# Patient Record
Sex: Female | Born: 1980 | Hispanic: No | Marital: Single | Smoking: Former smoker
Health system: Southern US, Community
[De-identification: ages and names within clinical notes are randomized; demographics above are authoritative.]

## PROBLEM LIST (undated history)

## (undated) ENCOUNTER — Inpatient Hospital Stay (HOSPITAL_COMMUNITY): Payer: Self-pay

## (undated) DIAGNOSIS — L0390 Cellulitis, unspecified: Secondary | ICD-10-CM

## (undated) DIAGNOSIS — F419 Anxiety disorder, unspecified: Secondary | ICD-10-CM

## (undated) DIAGNOSIS — F200 Paranoid schizophrenia: Secondary | ICD-10-CM

## (undated) DIAGNOSIS — F1910 Other psychoactive substance abuse, uncomplicated: Secondary | ICD-10-CM

## (undated) DIAGNOSIS — F319 Bipolar disorder, unspecified: Secondary | ICD-10-CM

## (undated) DIAGNOSIS — L0291 Cutaneous abscess, unspecified: Secondary | ICD-10-CM

## (undated) HISTORY — DX: Anxiety disorder, unspecified: F41.9

## (undated) HISTORY — PX: WISDOM TOOTH EXTRACTION: SHX21

---
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
## 2002-01-11 ENCOUNTER — Inpatient Hospital Stay (HOSPITAL_COMMUNITY): Admission: AD | Admit: 2002-01-11 | Discharge: 2002-01-11 | Payer: Self-pay | Admitting: *Deleted

## 2002-01-17 ENCOUNTER — Encounter (INDEPENDENT_AMBULATORY_CARE_PROVIDER_SITE_OTHER): Payer: Self-pay

## 2002-01-17 ENCOUNTER — Encounter (HOSPITAL_COMMUNITY): Admission: RE | Admit: 2002-01-17 | Discharge: 2002-01-19 | Payer: Self-pay | Admitting: *Deleted

## 2002-01-17 ENCOUNTER — Inpatient Hospital Stay (HOSPITAL_COMMUNITY): Admission: AD | Admit: 2002-01-17 | Discharge: 2002-01-21 | Payer: Self-pay | Admitting: *Deleted

## 2004-08-10 ENCOUNTER — Emergency Department (HOSPITAL_COMMUNITY): Admission: EM | Admit: 2004-08-10 | Discharge: 2004-08-10 | Payer: Self-pay | Admitting: Emergency Medicine

## 2005-07-03 ENCOUNTER — Emergency Department (HOSPITAL_COMMUNITY): Admission: EM | Admit: 2005-07-03 | Discharge: 2005-07-03 | Payer: Self-pay | Admitting: Emergency Medicine

## 2006-01-07 ENCOUNTER — Emergency Department (HOSPITAL_COMMUNITY): Admission: EM | Admit: 2006-01-07 | Discharge: 2006-01-07 | Payer: Self-pay | Admitting: Emergency Medicine

## 2006-01-08 ENCOUNTER — Ambulatory Visit: Payer: Self-pay | Admitting: Psychiatry

## 2006-01-08 ENCOUNTER — Inpatient Hospital Stay (HOSPITAL_COMMUNITY): Admission: EM | Admit: 2006-01-08 | Discharge: 2006-01-16 | Payer: Self-pay | Admitting: Psychiatry

## 2006-01-22 ENCOUNTER — Emergency Department (HOSPITAL_COMMUNITY): Admission: EM | Admit: 2006-01-22 | Discharge: 2006-01-22 | Payer: Self-pay | Admitting: Emergency Medicine

## 2006-01-23 ENCOUNTER — Inpatient Hospital Stay (HOSPITAL_COMMUNITY): Admission: AD | Admit: 2006-01-23 | Discharge: 2006-02-05 | Payer: Self-pay | Admitting: Psychiatry

## 2007-08-26 ENCOUNTER — Emergency Department (HOSPITAL_COMMUNITY): Admission: EM | Admit: 2007-08-26 | Discharge: 2007-08-26 | Payer: Self-pay | Admitting: Emergency Medicine

## 2008-09-28 ENCOUNTER — Inpatient Hospital Stay (HOSPITAL_COMMUNITY): Admission: AD | Admit: 2008-09-28 | Discharge: 2008-09-28 | Payer: Self-pay | Admitting: Obstetrics & Gynecology

## 2008-10-01 ENCOUNTER — Ambulatory Visit: Payer: Self-pay | Admitting: Obstetrics and Gynecology

## 2008-10-01 ENCOUNTER — Encounter: Payer: Self-pay | Admitting: Obstetrics & Gynecology

## 2008-10-01 ENCOUNTER — Inpatient Hospital Stay (HOSPITAL_COMMUNITY): Admission: AD | Admit: 2008-10-01 | Discharge: 2008-10-03 | Payer: Self-pay | Admitting: Obstetrics & Gynecology

## 2009-05-20 ENCOUNTER — Emergency Department (HOSPITAL_COMMUNITY): Admission: EM | Admit: 2009-05-20 | Discharge: 2009-05-20 | Payer: Self-pay | Admitting: Emergency Medicine

## 2011-02-21 ENCOUNTER — Emergency Department (HOSPITAL_BASED_OUTPATIENT_CLINIC_OR_DEPARTMENT_OTHER)
Admission: EM | Admit: 2011-02-21 | Discharge: 2011-02-21 | Disposition: A | Discharge status: Home / Self Care | Payer: Self-pay | Attending: Emergency Medicine | Admitting: Emergency Medicine

## 2011-02-21 DIAGNOSIS — N390 Urinary tract infection, site not specified: Secondary | ICD-10-CM | POA: Insufficient documentation

## 2011-02-21 DIAGNOSIS — R112 Nausea with vomiting, unspecified: Secondary | ICD-10-CM | POA: Insufficient documentation

## 2011-02-21 DIAGNOSIS — F319 Bipolar disorder, unspecified: Secondary | ICD-10-CM | POA: Insufficient documentation

## 2011-02-21 DIAGNOSIS — E876 Hypokalemia: Secondary | ICD-10-CM | POA: Insufficient documentation

## 2011-02-21 DIAGNOSIS — Z79899 Other long term (current) drug therapy: Secondary | ICD-10-CM | POA: Insufficient documentation

## 2011-02-21 DIAGNOSIS — R509 Fever, unspecified: Secondary | ICD-10-CM | POA: Insufficient documentation

## 2011-02-21 LAB — URINALYSIS, ROUTINE W REFLEX MICROSCOPIC
Ketones, ur: 15 mg/dL — AB
Protein, ur: 100 mg/dL — AB
Specific Gravity, Urine: 1.031 — ABNORMAL HIGH (ref 1.005–1.030)
Urine Glucose, Fasting: NEGATIVE mg/dL
Urobilinogen, UA: 1 mg/dL (ref 0.0–1.0)
pH: 6 (ref 5.0–8.0)

## 2011-02-21 LAB — URINE MICROSCOPIC-ADD ON

## 2011-02-21 LAB — PREGNANCY, URINE: Preg Test, Ur: NEGATIVE

## 2011-02-21 LAB — BASIC METABOLIC PANEL
CO2: 30 mEq/L (ref 19–32)
Calcium: 9.4 mg/dL (ref 8.4–10.5)

## 2011-02-23 ENCOUNTER — Ambulatory Visit (HOSPITAL_BASED_OUTPATIENT_CLINIC_OR_DEPARTMENT_OTHER): Admission: RE | Admit: 2011-02-23 | Payer: Self-pay | Source: Ambulatory Visit

## 2011-02-23 ENCOUNTER — Emergency Department (HOSPITAL_BASED_OUTPATIENT_CLINIC_OR_DEPARTMENT_OTHER)
Admission: EM | Admit: 2011-02-23 | Discharge: 2011-02-23 | Disposition: A | Discharge status: Home / Self Care | Payer: Self-pay | Attending: Emergency Medicine | Admitting: Emergency Medicine

## 2011-02-23 ENCOUNTER — Emergency Department (INDEPENDENT_AMBULATORY_CARE_PROVIDER_SITE_OTHER): Payer: Self-pay

## 2011-02-23 DIAGNOSIS — K838 Other specified diseases of biliary tract: Secondary | ICD-10-CM

## 2011-02-23 DIAGNOSIS — F319 Bipolar disorder, unspecified: Secondary | ICD-10-CM | POA: Insufficient documentation

## 2011-02-23 DIAGNOSIS — B9689 Other specified bacterial agents as the cause of diseases classified elsewhere: Secondary | ICD-10-CM | POA: Insufficient documentation

## 2011-02-23 DIAGNOSIS — R109 Unspecified abdominal pain: Secondary | ICD-10-CM

## 2011-02-23 DIAGNOSIS — M549 Dorsalgia, unspecified: Secondary | ICD-10-CM | POA: Insufficient documentation

## 2011-02-23 DIAGNOSIS — A499 Bacterial infection, unspecified: Secondary | ICD-10-CM | POA: Insufficient documentation

## 2011-02-23 DIAGNOSIS — N760 Acute vaginitis: Secondary | ICD-10-CM | POA: Insufficient documentation

## 2011-02-23 LAB — CBC
HCT: 37.1 % (ref 36.0–46.0)
Hemoglobin: 13 g/dL (ref 12.0–15.0)
MCHC: 35 g/dL (ref 30.0–36.0)
WBC: 13.5 10*3/uL — ABNORMAL HIGH (ref 4.0–10.5)

## 2011-02-23 LAB — BASIC METABOLIC PANEL
BUN: 7 mg/dL (ref 6–23)
CO2: 32 mEq/L (ref 19–32)
Calcium: 8.2 mg/dL — ABNORMAL LOW (ref 8.4–10.5)
GFR calc Af Amer: >60 mL/min (ref >60)
Glucose, Bld: 90 mg/dL (ref 70–99)
Potassium: 4.8 mEq/L (ref 3.5–5.1)
Sodium: 137 mEq/L (ref 135–145)

## 2011-02-23 LAB — URINALYSIS, ROUTINE W REFLEX MICROSCOPIC
Hgb urine dipstick: NEGATIVE
Ketones, ur: 15 mg/dL — AB
Nitrite: NEGATIVE
Protein, ur: NEGATIVE mg/dL
Urobilinogen, UA: 4 mg/dL — ABNORMAL HIGH (ref 0.0–1.0)

## 2011-02-23 LAB — HEPATIC FUNCTION PANEL
ALT: 29 U/L (ref 0–35)
Albumin: 2.9 g/dL — ABNORMAL LOW (ref 3.5–5.2)
Bilirubin, Direct: 0 mg/dL (ref 0.0–0.3)
Indirect Bilirubin: 1.1 mg/dL — ABNORMAL HIGH (ref 0.3–0.9)
Total Bilirubin: 1.1 mg/dL (ref 0.3–1.2)

## 2011-02-23 LAB — DIFFERENTIAL
Basophils Absolute: 0 10*3/uL (ref 0.0–0.1)
Basophils Relative: 0 % (ref 0–1)
Eosinophils Absolute: 0 10*3/uL (ref 0.0–0.7)
Lymphocytes Relative: 21 % (ref 12–46)
Neutro Abs: 9.2 10*3/uL — ABNORMAL HIGH (ref 1.7–7.7)

## 2011-02-23 LAB — LIPASE, BLOOD: Lipase: 164 U/L (ref 23–300)

## 2011-02-23 LAB — WET PREP, GENITAL: Yeast Wet Prep HPF POC: NONE SEEN

## 2011-02-23 LAB — URINE MICROSCOPIC-ADD ON

## 2011-02-23 LAB — PREGNANCY, URINE: Preg Test, Ur: NEGATIVE

## 2011-02-24 ENCOUNTER — Ambulatory Visit (HOSPITAL_BASED_OUTPATIENT_CLINIC_OR_DEPARTMENT_OTHER)
Admission: RE | Admit: 2011-02-24 | Discharge: 2011-02-24 | Disposition: A | Discharge status: Home / Self Care | Payer: Self-pay | Source: Ambulatory Visit | Attending: Emergency Medicine | Admitting: Emergency Medicine

## 2011-02-24 ENCOUNTER — Other Ambulatory Visit (HOSPITAL_BASED_OUTPATIENT_CLINIC_OR_DEPARTMENT_OTHER): Payer: Self-pay | Admitting: Emergency Medicine

## 2011-02-24 DIAGNOSIS — R109 Unspecified abdominal pain: Secondary | ICD-10-CM | POA: Insufficient documentation

## 2011-02-24 LAB — GC/CHLAMYDIA PROBE AMP, GENITAL: Chlamydia, DNA Probe: NEGATIVE

## 2011-04-04 LAB — CBC
HCT: 41.2 % (ref 36.0–46.0)
Hemoglobin: 13.5 g/dL (ref 12.0–15.0)
MCV: 78.2 fL (ref 78.0–100.0)
Platelets: 253 10*3/uL (ref 150–400)
RBC: 5.28 MIL/uL — ABNORMAL HIGH (ref 3.87–5.11)
WBC: 5.7 10*3/uL (ref 4.0–10.5)

## 2011-04-04 LAB — DIFFERENTIAL
Eosinophils Absolute: 0.1 10*3/uL (ref 0.0–0.7)
Eosinophils Relative: 1 % (ref 0–5)
Lymphs Abs: 2.4 10*3/uL (ref 0.7–4.0)
Monocytes Absolute: 0.6 10*3/uL (ref 0.1–1.0)
Monocytes Relative: 10 % (ref 3–12)

## 2011-04-04 LAB — BASIC METABOLIC PANEL
BUN: 7 mg/dL (ref 6–23)
Chloride: 104 mEq/L (ref 96–112)
Potassium: 3.5 mEq/L (ref 3.5–5.1)
Sodium: 138 mEq/L (ref 135–145)

## 2011-04-04 LAB — RAPID URINE DRUG SCREEN, HOSP PERFORMED: Cocaine: NOT DETECTED

## 2011-04-04 LAB — ETHANOL: Alcohol, Ethyl (B): <5 mg/dL (ref 0–10)

## 2011-05-09 NOTE — Op Note (Signed)
NAMECHARLAYNE, Briana Zhang               ACCOUNT NO.:  [id number]   MEDICAL RECORD NO.:  [id number]          PATIENT TYPE:  INP   LOCATION:  9137                          [facility]  [facility]   PHYSICIAN:  Lesly Dukes, M.D. DATE OF BIRTH:  [birth year]   DATE OF PROCEDURE:  DATE OF DISCHARGE:                               OPERATIVE REPORT   PREOPERATIVE DIAGNOSES:  1. Term intrauterine pregnancy in labor.  2. History of previous cesarean section.   POSTOPERATIVE DIAGNOSES:  1. Term intrauterine pregnancy in labor.  2. History of previous cesarean section.   PROCEDURE:  Repeat low transverse cesarean section.   SURGEON:  Lesly Dukes, MD   ASSISTANT:  Odie Sera, DO   ANESTHESIA:  Spinal.   REASON FOR PROCEDURE:  Briana Zhang is a 30 year old, gravida 3,  para 1-0-1-1, at 51 weeks' gestational age.  She was scheduled for a  repeat cesarean section tomorrow, but presented today with painful  contractions over the last 4-5 hours.  Of note, she has had no prenatal  care during this pregnancy and initially evaluation was done  approximately 3 days ago, revealed positive marijuana on her drug  screen, baseline hemoglobin and hematocrit at 10.7 and 33.4, blood type  was O+, antibody screen was negative, and an ultrasound performed at  that time did confirm a fetus of 38-week gestation.  Additionally, she  had a group B Strep test was negative.   DESCRIPTION OF PROCEDURE:  The patient was taken to the operating room  and spinal anesthesia was administered.  The patient was prepped and  draped in the usual sterile manner, and a time-out was conducted.  Appropriate anesthesia was confirmed.  A Pfannenstiel low transverse  skin incision was made and carried down to the fascia using the scalpel.  The fascia was incised in midline with a scalpel, and the scalpel  incision was extended left and right laterally with Mayo scissors.  The  fascia was bluntly and sharply dissected off  the underlying rectus  muscles both superiorly and inferiorly to the incision.  The rectus  muscles were manually entered and spread.  The peritoneum was bluntly  entered using the hemostat.  The peritoneal opening was extended with  electrocautery.  A bladder blade was placed, and a transverse incision  was made the scalpel on the lower uterine segment of the uterus.  The  incision was carefully carried down to the layers until the amnion was  noted.  The amnion was ruptured and clear amniotic fluid was noted.  The  uterine incision was extended manually, and the uterus was entered.  The  head was grasped and elevated out of the pelvis.  The head was delivered  through the uterine incision with fundal pressure.  The mouth and nose  were bulb suctioned.  The shoulders and rest of the corpus were  delivered also with the assistance of fundal pressure.  The viable  female infant had a spontaneous cry and was bulb suctioned once again.  The cord was clamped and cut, and the newborn was handed to the waiting  NICU staff.  A small extension of the uterine incision was noted near  the right corner.  This extension was repaired first with 0 Vicryl.  The  extension was repaired and then the uterine incision was repaired also  with 0 Vicryl in a running interlocking fashion in the usual manner.  Upon closure of the uterine incision, good hemostasis was noted.  The  abdomen was irrigated with normal saline.  Good hemostasis was again  noted of the uterine incision.  The undersurface areas of the fascia was  inspected, and a small area of bleeding on the rectus muscles was noted  and was treated with electrocautery.  Good hemostasis was noted of the  rectus muscles in the underlying fascia, and the fascia was closed with  0 Vicryl in a running noninterlocking fashion.  Good hemostasis was  again noted, and no defects were noted in the fascia.  The skin was  closed with staples in the usual manner.   Pressure dressing was applied.   FINDINGS:  1. Clear amniotic fluid.  2. Viable female infant.   SPECIMENS:  Placenta.   DISPOSITION:  To Labor and Delivery.   ESTIMATED BLOOD LOSS:  700 mL.   COMPLICATIONS:  None immediate.   The patient was transferred to the PACU in good condition.      Odie Sera, DO      Lesly Dukes, M.D.  Electronically Signed    MC/MEDQ  D:  10/01/2008  T:  10/02/2008  Job:  [id number]

## 2011-05-09 NOTE — Discharge Summary (Signed)
NAMEKAMA, CAMMARANO               ACCOUNT NO.:  [id number]   MEDICAL RECORD NO.:  [id number]          PATIENT TYPE:  INP   LOCATION:  9137                          [facility]  [facility]   PHYSICIAN:  Norton Blizzard, MD    DATE OF BIRTH:  04-12-1981   DATE OF ADMISSION:  10/01/2008  DATE OF DISCHARGE:  10/03/2008                               DISCHARGE SUMMARY   REASON FOR ADMISSION:  Onset of labor.   DISCHARGE DIAGNOSIS:  Status post term delivery via repeat low-  transverse cesarean section.   PROCEDURES DURING ADMISSION:  Nonstress test and low-transverse cesarean  section.   HOSPITAL COURSE AND HISTORY:  Briefly, this is a 30 year old G3, P2-0-1-  2 who presented in labor 1 day prior to her scheduled cesarean section  date.  She was admitted and had a repeat low transverse cesarean section  with an estimated blood loss of 700 mL.  Baby was a female born at 33  weeks, Apgars of 9 at 1 and 9 at 5 weighing 8 pounds 4 ounces.  Mother  wants Implanon bridged by oral contraceptive pills, is bottle feeding.  The patient followed a steady course of improvement throughout her stay  and by discharge, was ambulatory, eating, stooling, and pain was  controlled.   The patient's labs are blood type O+, antibody negative, rubella immune,  HIV negative, hepatitis B surface antigen negative, GC and chlamydia  negative, group B strep negative.  Hemoglobin at discharge 9.1.   ACTIVITY:  Pelvic rest for 6 weeks and no heavy lifting for greater than  10 pounds, routine diet.   Medications at discharge will be:  1. Fluoxetine.  2. Prenatal vitamins.  3. Tri-Sprintec 1 tablet p.o. daily start 2 weeks after delivery.  4. Ibuprofen 600 mg p.o. q.6 h. p.r.n. pain.  5. Colace 100 mg p.o. b.i.d. p.r.n. constipation.  6. Ferrous sulfate 325 mg p.o. b.i.d.  7. Percocet 5/325 one to two tablets p.o. q.6 h. p.r.n. pain.   DISCHARGE CONDITION:  Stable.   DISCHARGE DISPOSITION:  Will be to home.  Follow  up will be in 6 weeks  at the [facility] Department and baby love nurse will come  by on postop day #5, that will be Monday, October 05, 2008, to remove  the patient's staples.      Rodney Langton, MD  Electronically Signed     ______________________________  Norton Blizzard, MD    TT/MEDQ  D:  10/03/2008  T:  10/03/2008  Job:  [id number]

## 2011-05-12 NOTE — Discharge Summary (Signed)
NAMEMATTHEW, PAIS               ACCOUNT NO.:  [id number]   MEDICAL RECORD NO.:  [id number]          PATIENT TYPE:  IPS   LOCATION:  0406                          [facility]   PHYSICIAN:  Jeanice Lim, M.D. DATE OF BIRTH:  [birth year]   DATE OF ADMISSION:  01/23/2006  DATE OF DISCHARGE:  02/05/2006                                 DISCHARGE SUMMARY   IDENTIFYING DATA:  This is a 30 year old single Caucasian female  involuntarily committed.  Presenting with bipolar symptoms, noncompliant  with medications, auditory and visual hallucinations, receiving messages  from TV.  Reported that she had problems sleeping.  Slept at [place].  Stopped medications.  Getting days confused.  Angry at parents who want  custody of her child.  States she has gone to a methadone clinic due to  history of OxyContin use.  No opiate abuse for one year.  Denied suicidal or  homicidal ideation or psychosis.   PAST PSYCHIATRIC HISTORY:  History of bipolar disorder.   FAMILY HISTORY:  Mother has a history of depression and alcohol use.   ALCOHOL/DRUG HISTORY:  Smokes.   PRIMARY CARE PHYSICIAN:  Is followed up at [facility].   MEDICATIONS:  Depakote, Risperdal and Seroquel.   ALLERGIES:  No known drug allergies.   PHYSICAL EXAMINATION:  Physical and neurologic exam essentially within  normal limits.  Performed at [facility].  Slender, well-nourished female.  No physical distress.  No psychomotor abnormality.   REVIEW OF SYSTEMS:  Essentially within normal limits.   MENTAL STATUS EXAM:  Fully alert, cooperative.  Good eye contact.  Speech  clear, normal rate, volume and tone.  Mood angry.  Affect constricted.  Thought processes delusional, questionable, with grandiosity.  Cognitively  intact.  Poor insight and judgment.  Poor historian.   ADMISSION DIAGNOSES:  AXIS I:  Psychotic disorder not otherwise specified.  Rule out bipolar disorder, type 1.  Polysubstance abuse.  Possible substance-  induced mood disorder superimposed on bipolar disorder with psychotic  features.  AXIS II:  Deferred.  AXIS III:  None.  AXIS IV:  Moderate (problems with support system and other psychosocial  issues).  AXIS V:  25-30/60.   HOSPITAL COURSE:  The patient was admitted and ordered routine p.r.n.  medications and underwent further monitoring.  Was encouraged to participate  in individual, group and milieu therapy.  Collateral sources were contacted  and medications were reconciled and resumed.  The patient was monitored for  safety, monitored medically and medications optimized.  The patient  gradually reported a positive response initially with limited insight and  severe mood instability.  Psychotic symptoms and mood lability gradually  improved and patient was eventually seen by a second and third psychiatrist  for a second and third opinion regarding diagnosis and treatment plan.  The  patient was ambivalent about taking medications, ambivalent about diagnosis,  required multiple p.r.n.'s including IM Ativan and Cogentin.  The patient  participated in aftercare planning.  Family was involved and patient was  discharged with her father.   CONDITION ON DISCHARGE:  More stable mood, stable, __________  full insight.  However, mood instability, psychotic symptoms and dangerous ideation had all  resolved and patient was doing much better.  The patient was given  medication education along with family.   DISCHARGE MEDICATIONS:  1.  Colace 100 mg b.i.d.  2.  Protonix 40 mg daily.  3.  Haldol 5 mg twice a day and at bedtime.  4.  Cogentin 1 mg twice a day.  5.  Risperdal 1 mg at bedtime.   FOLLOW UP:  To follow up with Dr. Tomasa Rand on Monday, February 12, 2006 at  10:15 a.m. and at [facility] on Wednesday, February 07, 2006 at  8 a.m.  The patient was to call Woodie on Tuesday morning.  The patient was  discharged in improved condition with no acute safety issues.    DISCHARGE DIAGNOSES:  AXIS I:  Psychotic disorder not otherwise specified.  Rule out bipolar disorder, type 1.  Polysubstance abuse.  Possible substance-  induced mood disorder superimposed on bipolar disorder with psychotic  features.  AXIS II:  Deferred.  AXIS III:  None.  AXIS IV:  Moderate (problems with support system and other psychosocial  issues).  AXIS V:  GAF on discharge 55.      Jeanice Lim, M.D.  Electronically Signed     JEM/MEDQ  D:  02/25/2006  T:  02/26/2006  Job:  [id number]

## 2011-05-12 NOTE — Op Note (Signed)
[facility]  Patient:    Briana Zhang, Briana Zhang Visit Number: [id number] MRN: [id number]          Service Type: OBS Location: 910A 9121 01 Attending Physician:  Michaelle Copas Dictated by:   Bing Neighbors Clearance Coots, M.D. Proc. Date: 01/18/02 Admit Date:  01/17/2002                             Operative Report  PREOPERATIVE DIAGNOSES:       1. Arrest of descent.                               2. Meconium.                               3. Persistent occiput posterior.  POSTOPERATIVE DIAGNOSES:      1. Arrest of descent.                               2. Meconium.                               3. Persistent occiput posterior.  PROCEDURE:                    Primary low transverse cesarean section.  SURGEON:                      Charles A. Clearance Coots, M.D.  ASSISTANT:                    Marlinda Mike, C.N.M.  ANESTHESIA:                   Epidural.  ESTIMATED BLOOD LOSS:         700 ml.  IV FLUIDS:                    1600 ml.  URINE OUTPUT:                 300 ml clear.  COMPLICATIONS:                None.  DRAINS:                       Foley to gravity.  FINDINGS:                     Viable female at 79.  Apgars of 9 at one minute and 9 at five minutes.  Weight 8 lb 1 oz.  Normal uterus, ovaries and fallopian tubes.  DESCRIPTION OF PROCEDURE:     The patient was brought to the operating room. After satisfactory redosing of the epidural, the abdomen was prepped and draped in the usual sterile fashion.  A Pfannenstiel skin incision was made with a scalpel and was deepened down to the fascia with a scalpel.  The fascia was nicked in the midline and the fascial incision was extended to the left and to the right with curved Mayo scissors.  The superior and inferior fascial edges were taken off of the rectus muscles with both blunt and sharp dissection.  The rectus muscle was then divided  in the midline superiorly and inferiorly, being careful to  avoid the urinary bladder inferiorly.  The peritoneum was entered digitally and was digitally extended to the left and to the right.  A bladder blade was positioned and the vesicouterine fold of the peritoneum above the reflection of the urinary bladder was grasped with forceps and was incised and undermined with Metzenbaum scissors.  The incision was extended to the left and to the right with Metzenbaum scissors.  A bladder flap was bluntly developed and the bladder blade was repositioned in front of the urinary bladder, placing it well out of the operative field.  The uterus was then entered in the lower uterine segment transversely with a scalpel down to the amniotic sac.  The uterine incision was then extended to the left and to the right with bandage scissors.  The amniotic sac was ruptured and clear fluid was expelled.  The fetus was noted to be right occiput posterior.  The occiput was rotated and flexed up into the incision.  Delivery was then accomplished with the aid of fundal pressure from the assistant.  The infants mouth and nose were suctioned with a suction bulb and delivery was then completed with the aid of fundal pressure from the assistant.  The umbilical cord was doubly clamped and cut and the infant was handed off to the nursery staff.  Cord blood was obtained and the placenta was spontaneously expelled from the uterine cavity intact.  The edges of the uterine incision were grasped with ring forceps.  The endometrial surface was thoroughly debrided with a dry lap sponge.  The uterus was closed with a continuous interlocking suture of 0 Monocryl from each corner to the center.  Hemostasis was excellent.  The pelvic cavity was thoroughly irrigated with warm saline solution and all clots were removed.  Closure of the uterus was again observed for hemostasis and there was no active bleeding noted.  The abdomen was then closed as follows.  The fascia was closed with a  continuous suture of 0 PDS from each corner to the center.  The subcutaneous tissue was thoroughly irrigated with warm saline solution.  All areas of subcutaneous bleeding were coagulated with the Bovie.  The skin was then approximated with surgical stainless steel staples.  A sterile bandage was applied to the incision closure.  The surgical technician indicated that all sponge, needle and instrument counts were correct.  The patient tolerated the procedure well and was transported to the recovery room in satisfactory condition. Dictated by:   Bing Neighbors Clearance Coots, M.D. Attending Physician:  Michaelle Copas DD:  01/18/02 TD:  01/19/02 Job: [id number]

## 2011-05-12 NOTE — H&P (Signed)
Briana Zhang, Briana Zhang               ACCOUNT NO.:  [id number]   MEDICAL RECORD NO.:  [id number]          PATIENT TYPE:  IPS   LOCATION:  0400                          [facility]ILITY[facility]  [facility]   PHYSICIAN:  Anselm Jungling, MD  DATE OF BIRTH:  [birth year]   DATE OF ADMISSION:  01/08/2006  DATE OF DISCHARGE:                         PSYCHIATRIC ADMISSION ASSESSMENT   Patient is a 30 year old single white female voluntarily admitted on January 08, 2006.   HISTORY OF PRESENT ILLNESS:  The patient present with a history of  psychosis.  Patient thinks that she is the American idol and able to  communicate with TV.  Patient states she has not slept in the past few days  because she has been doing stuff.  She states that her mother brought her  to the emergency department, but she is not sure why.   PAST PSYCHIATRIC HISTORY:  First admission to [facility].  It  appears through chart that patient has been at the methadone clinic for the  past 4 months.   SOCIAL HISTORY:  A 30 year old single white female who has a 59-year-old  child who is currently with the patient's mother.  She obtained her GED.  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  She had a DUI pending and  possession of cannabis.   FAMILY HISTORY:  Mother and grandmother have depression.   ALCOHOL OR DRUG HISTORY:  Patient smokes and has been using marijuana daily  since age 36.   PRIMARY CARE Briana Zhang:  Unknown.   MEDICAL PROBLEMS:  None.   MEDICATIONS:  None.   DRUG ALLERGIES:  No known drug allergies.   PHYSICAL EXAMINATION:  Patient was assessed at [facility].  This is a  healthy-appearing young female in no acute distress.  Her temperature is  98.7, heart rate 74, respirations 18, blood pressure 113/78, 99% saturation.  She is 5 feet 1 inch tall, weight 92 pounds.  Her WBC count was 10.9.  Urinalysis negative.  Alcohol level was less than 5.  Pregnancy test is  negative.  Urine drug screen positive for  THC.   MENTAL STATUS EXAM:  This is a very sleepy female lying in bed.  She opens  her eyes briefly with her name being called.  Patient is only able to say a  few words.  They are clear.  Patient's thought processes and cognitive  functions are unable to ascertain due to patient's state.  Patient is  delusional.  She is a poor historian and reports a confusing history.   ADMISSION DIAGNOSES:  AXIS I:  Psychosis, not otherwise specified.  Rule out  substance-induced psychosis.  Rule out opiate dependence and  tetrahydrocannabinol abuse.  AXIS II:  Deferred.  AXIS III:  None.  AXIS IV:  Deferred at this time.  AXIS V:  Current is 30.   PLAN:  Contract for safety.  Patient will be placed on the 400 hall for  close monitoring.  We will stabilize mood and thinking.  We will initiated  Geodon.  We will also contact mother for any background information.  We  will continue to monitor the patient's behavior on the unit.  Tentative  length of stay is 5-6 days.      Landry Corporal, N.P.      Anselm Jungling, MD  Electronically Signed    JO/MEDQ  D:  01/12/2006  T:  01/12/2006  Job:  [phone]

## 2011-05-12 NOTE — Discharge Summary (Signed)
Briana Zhang, Briana Zhang               ACCOUNT NO.:  [id number]   MEDICAL RECORD NO.:  [id number]          PATIENT TYPE:  IPS   LOCATION:  0400                          [facility]   PHYSICIAN:  Anselm Jungling, MD  DATE OF BIRTH:  1981/01/02   DATE OF ADMISSION:  01/08/2006  DATE OF DISCHARGE:  01/16/2006                                 DISCHARGE SUMMARY   IDENTIFYING DATA AND REASON FOR ADMISSION:  The patient is a 30 year old  single white female who was admitted on a voluntary basis due to severe  psychosis and delusionality. 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 She had not slept for  several days prior to admission. Her mother had brought her to the emergency  department. She had a history of substance abuse. This was her first ever  admission to our inpatient psychiatric service. She had also been involved  in a methadone clinic for the 4 months prior to admission.   The patient is a single mother of 41-year-old child who was currently  residing with the patient's own mother. She had been working as a Research officer, trade union. She had legal charges pending for DUI and possession of cannabis.  Please refer to the admission note for further details pertaining to the  symptoms, circumstances and history that led to her hospitalization. She was  given an initial Axis I diagnosis of psychosis NOS, and rule out substance-  induced psychosis, and rule out opiate and THC dependence.   MEDICAL AND LABORATORY:  The patient came to [place] without any significant  medical problems and was on no medications at the time of admission and had  no known drug allergies. She was physically assessed by the psychiatric  nurse practitioner upon admission.   HOSPITAL COURSE:  The patient was admitted to the adult inpatient  psychiatric service. She presented as a slender, petite but normally  developed, young adult female who was suspicious,  guarded and vague, and a  poor historian. She seemed to have no insight into the reason for her  admission to the hospital.   She was started on a regimen of Geodon 40 milligrams b.i.d. to address her  psychotic symptoms. Ambien and Librium were available on a p.r.n. basis.   The patient appeared to have little response to 40 milligrams b.i.d. of  Geodon so the dose was raised to 80 milligrams b.i.d.. Seroquel was  introduced on a p.r.n. basis to address agitation.   By the third hospital day, the patient appeared to be having no response to  Geodon whatsoever. Since she had verbalized a desire not to take medication  it was presumed that she was not actually swallowing Geodon doses given to  her. For this reason, she was switched from Geodon to Risperdal M-tab, 1  milligram q.a.m. and 2 milligrams q.h.s.   The patient's psychotic symptoms, and frank mania developed over the third  through fifth hospital days. She was quite hyperactive, at times euphoric,  and quite intrusive with significant boundary issues on the unit that  led to  her being placed on one-to-one observation for a period of time. She was  started on Depakote 500 milligrams q.h.s. and q.a.m. Risperdal was  continued, but subsequently increased to 2.5 milligrams q.8 p.m.Marland Kitchen Depakote  was subsequently increased to 500 milligrams q.a.m. and 750 milligrams  q.p.m., along with Seroquel 75 milligrams q.a.m.. The patient began to  gradually stabilize, and one-to-one observation was able to be discontinued  safely. Over the remainder of her inpatient stay, she gradually began to  develop more insight into the fact that she had been psychotic and manic,  and was accepting of the need for ongoing medication treatment and follow-up  care.   By the ninth hospital day, the patient appeared to be sufficiently re-  compensated to discharge her home and to outpatient follow-up.   AFTERCARE:  The patient was to follow-up for medication  management with her  previous providers. She was also to follow-up with alcohol and drugs  services to address substance abuse issues.   DISCHARGE MEDICATIONS:  1.  Depakote ER 1000 milligrams q.h.s.  2.  Risperdal 2 milligrams q.h.s.  3.  Seroquel 25 milligrams q.h.s..   DISCHARGE DIAGNOSES:  AXIS I:  Bipolar disorder, type 1, most recently manic  with psychotic features.  Polysubstance dependence.  AXIS II:  Deferred.  AXIS III:  No acute or chronic illnesses.  AXIS IV:  Stressors moderate.  AXIS V:  Global assessment of function on discharge 65.           ______________________________  Anselm Jungling, MD  Electronically Signed     SPB/MEDQ  D:  01/17/2006  T:  01/17/2006  Job:  [id number]

## 2011-05-12 NOTE — Discharge Summary (Signed)
[facility]  Patient:    Briana Zhang, Briana Zhang Visit Number: [id number] MRN: [id number]          Service Type: ANT Location: [facility] Attending Physician:  Michaelle Copas Dictated by:   Emelda Fear, M.D. Admit Date:  01/17/2002 Discharge Date: 01/19/2002                             Discharge Summary  DATE OF BIRTH:                [birth year]  PROCEDURES:                   Low transverse cesarean section.  CONSULTS:                     None.  DISCHARGE DIAGNOSES:          1. Intrauterine pregnancy at 40-4/[redacted] weeks                                  gestation.                               2. Spontaneous rupture of membranes.                               3. Status post low transverse cesarean section                                  secondary to arrest of descent.                               4. Delivery of a singleton female infant.                               5. Postoperative endometritis.  DISCHARGE MEDICATIONS:        1. Ibuprofen 600 mg one p.o. q.6h. p.r.n. mild                                  pain.                               2. Percocet 5/325 mg one tablet p.o. q.6h.                                  p.r.n. stronger pain.                               3. Depo-Provera.  The patient received an                                  injection prior to discharge and will need  the next injection in three months.                               4. Prenatal vitamins one tablet p.o. q.d. while                                  breastfeeding.  HISTORY OF PRESENT ILLNESS:   Briana Zhang is an 30 year old, G1, P0-0-0, at 40-4/[redacted] weeks gestation, presenting secondary to spontaneous rupture of membranes.  The patient was admitted and underwent low-dose Pitocin augmentation.  Of note, meconium-stained fluid was appreciated.  The patient underwent immunoinfusion.  The patient also received antibiotics secondary  to prolonged rupture of membranes.  The patient eventually underwent primary low transverse cesarean section secondary to arrest of descent, as well as meconium-stained fluid and persistent occipitoposterior presentation with delivery.  A viable female infant with Apgars of 9 at one minute and 9 at five minutes with a weight of 8 pounds 1 ounce was delivered.  The patient developed postoperative endometritis and received two days of Minocin therapy. She remained afebrile for two days prior to discharge.  Also of note, the patient was complaining of some lability that was improved with ice packs and bed rest.  The patient is breast and bottle feeding and received Depo-Provera prior to discharge.  Of note, the patient also received a rubella vaccination prior to discharge.  The patient will follow up in six weeks at [facility] for a postpartum visit.  ACTIVITY:                     No heavy lifting for four weeks.  DIET:                         Routine.  MEDICATIONS:                  As above.  DISPOSITION:                  Discharged to home.  DISCHARGE LABORATORY DATA:    Hemoglobin 10.4, hematocrit 30.6.  DISCHARGE STATUS:             Well. Dictated by:   Emelda Fear, M.D. Attending Physician:  Michaelle Copas DD:  01/21/02 TD:  01/21/02 Job: [id number]

## 2011-05-26 ENCOUNTER — Emergency Department (HOSPITAL_COMMUNITY)
Admission: EM | Admit: 2011-05-26 | Discharge: 2011-05-26 | Disposition: A | Discharge status: Home / Self Care | Payer: Self-pay | Attending: Emergency Medicine | Admitting: Emergency Medicine

## 2011-05-26 DIAGNOSIS — R300 Dysuria: Secondary | ICD-10-CM | POA: Insufficient documentation

## 2011-05-26 DIAGNOSIS — R509 Fever, unspecified: Secondary | ICD-10-CM | POA: Insufficient documentation

## 2011-05-26 DIAGNOSIS — N390 Urinary tract infection, site not specified: Secondary | ICD-10-CM | POA: Insufficient documentation

## 2011-05-26 LAB — URINE MICROSCOPIC-ADD ON

## 2011-05-26 LAB — URINALYSIS, ROUTINE W REFLEX MICROSCOPIC
Bilirubin Urine: NEGATIVE
Glucose, UA: NEGATIVE mg/dL
Specific Gravity, Urine: 1.015 (ref 1.005–1.030)
pH: 7 (ref 5.0–8.0)

## 2011-05-28 LAB — URINE CULTURE

## 2011-07-30 ENCOUNTER — Observation Stay (HOSPITAL_COMMUNITY)
Admission: EM | Admit: 2011-07-30 | Discharge: 2011-07-31 | Disposition: A | Discharge status: Home / Self Care | Payer: Self-pay | Attending: Emergency Medicine | Admitting: Emergency Medicine

## 2011-07-30 ENCOUNTER — Emergency Department (HOSPITAL_COMMUNITY): Payer: Self-pay

## 2011-07-30 DIAGNOSIS — L0201 Cutaneous abscess of face: Principal | ICD-10-CM | POA: Insufficient documentation

## 2011-07-30 DIAGNOSIS — L03211 Cellulitis of face: Principal | ICD-10-CM | POA: Insufficient documentation

## 2011-07-30 LAB — POCT I-STAT, CHEM 8
BUN: 7 mg/dL (ref 6–23)
Creatinine, Ser: 0.6 mg/dL (ref 0.50–1.10)
Hemoglobin: 14.3 g/dL (ref 12.0–15.0)
Potassium: 3.5 mEq/L (ref 3.5–5.1)
Sodium: 138 mEq/L (ref 135–145)
TCO2: 27 mmol/L (ref 0–100)

## 2011-07-30 LAB — CBC
Hemoglobin: 13.9 g/dL (ref 12.0–15.0)
MCH: 26.6 pg (ref 26.0–34.0)
Platelets: 288 10*3/uL (ref 150–400)
RBC: 5.22 MIL/uL — ABNORMAL HIGH (ref 3.87–5.11)
WBC: 12.7 10*3/uL — ABNORMAL HIGH (ref 4.0–10.5)

## 2011-07-30 LAB — POCT PREGNANCY, URINE: Preg Test, Ur: NEGATIVE

## 2011-07-30 MED ORDER — IOHEXOL 300 MG/ML  SOLN
80.0000 mL | Freq: Once | INTRAMUSCULAR | Status: AC | PRN
  Administered 2011-07-30: 80 mL via INTRAVENOUS

## 2011-09-25 ENCOUNTER — Emergency Department (HOSPITAL_COMMUNITY)
Admission: EM | Admit: 2011-09-25 | Discharge: 2011-09-25 | Disposition: A | Discharge status: Home / Self Care | Payer: No Typology Code available for payment source | Attending: Emergency Medicine | Admitting: Emergency Medicine

## 2011-09-25 ENCOUNTER — Emergency Department (HOSPITAL_COMMUNITY): Payer: No Typology Code available for payment source

## 2011-09-25 DIAGNOSIS — S139XXA Sprain of joints and ligaments of unspecified parts of neck, initial encounter: Secondary | ICD-10-CM | POA: Insufficient documentation

## 2011-09-25 DIAGNOSIS — Y92410 Unspecified street and highway as the place of occurrence of the external cause: Secondary | ICD-10-CM | POA: Insufficient documentation

## 2011-09-25 DIAGNOSIS — M549 Dorsalgia, unspecified: Secondary | ICD-10-CM | POA: Insufficient documentation

## 2011-09-25 LAB — STREP B DNA PROBE: Strep Group B Ag: NEGATIVE

## 2011-09-25 LAB — CBC
HCT: 33.4 — ABNORMAL LOW
Hemoglobin: 10.7 — ABNORMAL LOW
MCHC: 32
MCHC: 33.1
MCV: 81.1
MCV: 81.7
Platelets: 249
RBC: 3.4 — ABNORMAL LOW
RDW: 14.3

## 2011-09-25 LAB — GLUCOSE, RANDOM: Glucose, Bld: 106 — ABNORMAL HIGH

## 2011-09-25 LAB — DIFFERENTIAL
Basophils Absolute: 0
Basophils Relative: 0
Eosinophils Absolute: 0
Eosinophils Relative: 0
Lymphocytes Relative: 27
Monocytes Absolute: 0.7

## 2011-09-25 LAB — RAPID URINE DRUG SCREEN, HOSP PERFORMED
Barbiturates: NOT DETECTED
Cocaine: NOT DETECTED
Opiates: NOT DETECTED

## 2011-09-25 LAB — URINALYSIS, ROUTINE W REFLEX MICROSCOPIC
Bilirubin Urine: NEGATIVE
Nitrite: NEGATIVE
Specific Gravity, Urine: 1.01
Urobilinogen, UA: 0.2
pH: 7

## 2011-09-25 LAB — TYPE AND SCREEN: ABO/RH(D): O POS

## 2011-09-25 LAB — HEPATITIS B SURFACE ANTIGEN: Hepatitis B Surface Ag: NEGATIVE

## 2011-09-25 LAB — ABO/RH: ABO/RH(D): O POS

## 2012-05-29 DIAGNOSIS — F17200 Nicotine dependence, unspecified, uncomplicated: Secondary | ICD-10-CM | POA: Insufficient documentation

## 2012-05-29 DIAGNOSIS — F1920 Other psychoactive substance dependence, uncomplicated: Secondary | ICD-10-CM | POA: Insufficient documentation

## 2012-05-30 ENCOUNTER — Encounter (HOSPITAL_COMMUNITY): Payer: Self-pay | Admitting: Family Medicine

## 2012-05-30 ENCOUNTER — Emergency Department (HOSPITAL_COMMUNITY)
Admission: EM | Admit: 2012-05-30 | Discharge: 2012-05-31 | Discharge status: Against Medical Advice | Payer: Self-pay | Attending: Emergency Medicine | Admitting: Emergency Medicine

## 2012-05-30 DIAGNOSIS — F1120 Opioid dependence, uncomplicated: Secondary | ICD-10-CM

## 2012-05-30 HISTORY — DX: Paranoid schizophrenia: F20.0

## 2012-05-30 HISTORY — DX: Bipolar disorder, unspecified: F31.9

## 2012-05-30 LAB — ACETAMINOPHEN LEVEL: Acetaminophen (Tylenol), Serum: <15 ug/mL (ref 10–30)

## 2012-05-30 LAB — URINE MICROSCOPIC-ADD ON

## 2012-05-30 LAB — CBC
HCT: 44.1 % (ref 36.0–46.0)
MCH: 25.5 pg — ABNORMAL LOW (ref 26.0–34.0)
MCHC: 32 g/dL (ref 30.0–36.0)
MCV: 79.7 fL (ref 78.0–100.0)
RDW: 14.8 % (ref 11.5–15.5)

## 2012-05-30 LAB — URINALYSIS, ROUTINE W REFLEX MICROSCOPIC
Bilirubin Urine: NEGATIVE
Glucose, UA: NEGATIVE mg/dL
Hgb urine dipstick: NEGATIVE
Ketones, ur: NEGATIVE mg/dL
Protein, ur: NEGATIVE mg/dL

## 2012-05-30 LAB — COMPREHENSIVE METABOLIC PANEL
Albumin: 3.5 g/dL (ref 3.5–5.2)
BUN: 9 mg/dL (ref 6–23)
Calcium: 9.2 mg/dL (ref 8.4–10.5)
Creatinine, Ser: 0.62 mg/dL (ref 0.50–1.10)
Total Protein: 6.9 g/dL (ref 6.0–8.3)

## 2012-05-30 LAB — ETHANOL: Alcohol, Ethyl (B): <11 mg/dL (ref 0–11)

## 2012-05-30 MED ORDER — ZOLPIDEM TARTRATE 5 MG PO TABS: 5.0000 mg | ORAL_TABLET | Freq: Every evening | ORAL | Status: DC | PRN

## 2012-05-30 MED ORDER — ONDANSETRON 4 MG PO TBDP: 4.0000 mg | ORAL_TABLET | Freq: Four times a day (QID) | ORAL | Status: DC | PRN

## 2012-05-30 MED ORDER — ACETAMINOPHEN 325 MG PO TABS: 650.0000 mg | ORAL_TABLET | ORAL | Status: DC | PRN

## 2012-05-30 MED ORDER — IBUPROFEN 600 MG PO TABS: 600.0000 mg | ORAL_TABLET | Freq: Three times a day (TID) | ORAL | Status: DC | PRN

## 2012-05-30 MED ORDER — METHOCARBAMOL 500 MG PO TABS
500.0000 mg | ORAL_TABLET | Freq: Three times a day (TID) | ORAL | Status: DC | PRN
  Filled 2012-05-30: qty 1

## 2012-05-30 MED ORDER — LOPERAMIDE HCL 2 MG PO CAPS: 2.0000 mg | ORAL_CAPSULE | ORAL | Status: DC | PRN

## 2012-05-30 MED ORDER — LORAZEPAM 1 MG PO TABS
1.0000 mg | ORAL_TABLET | Freq: Three times a day (TID) | ORAL | Status: DC | PRN
  Administered 2012-05-30 (×4): 1 mg via ORAL
  Filled 2012-05-30: qty 1

## 2012-05-30 MED ORDER — HYDROXYZINE HCL 25 MG PO TABS
25.0000 mg | ORAL_TABLET | Freq: Four times a day (QID) | ORAL | Status: DC | PRN
  Administered 2012-05-30 (×3): 25 mg via ORAL
  Filled 2012-05-30 (×5): qty 1

## 2012-05-30 MED ORDER — DICYCLOMINE HCL 20 MG PO TABS: 20.0000 mg | ORAL_TABLET | Freq: Four times a day (QID) | ORAL | Status: DC | PRN

## 2012-05-30 MED ORDER — CLONIDINE HCL 0.1 MG PO TABS: 0.1000 mg | ORAL_TABLET | ORAL | Status: DC

## 2012-05-30 MED ORDER — NICOTINE 21 MG/24HR TD PT24
21.0000 mg | MEDICATED_PATCH | Freq: Every day | TRANSDERMAL | Status: DC
  Administered 2012-05-30: 21 mg via TRANSDERMAL
  Filled 2012-05-30 (×2): qty 1

## 2012-05-30 MED ORDER — CLONIDINE HCL 0.1 MG PO TABS: 0.1000 mg | ORAL_TABLET | Freq: Every day | ORAL | Status: DC

## 2012-05-30 MED ORDER — ONDANSETRON HCL 4 MG PO TABS: 4.0000 mg | ORAL_TABLET | Freq: Three times a day (TID) | ORAL | Status: DC | PRN

## 2012-05-30 MED ORDER — CLONIDINE HCL 0.1 MG PO TABS
0.1000 mg | ORAL_TABLET | Freq: Four times a day (QID) | ORAL | Status: DC
  Administered 2012-05-30 – 2012-05-31 (×4): 0.1 mg via ORAL
  Filled 2012-05-30 (×4): qty 1

## 2012-05-30 MED ORDER — ALUM & MAG HYDROXIDE-SIMETH 200-200-20 MG/5ML PO SUSP: 30.0000 mL | ORAL | Status: DC | PRN

## 2012-05-30 MED ORDER — NAPROXEN 500 MG PO TABS: 500.0000 mg | ORAL_TABLET | Freq: Two times a day (BID) | ORAL | Status: DC | PRN

## 2012-05-30 NOTE — ED Notes (Addendum)
Charting on Ativan, 1mg  at [phone] charted in error.  Ativan, 1mg  given at 1634.

## 2012-05-30 NOTE — ED Notes (Signed)
Anne Shutter, PA at bedside.

## 2012-05-30 NOTE — ED Notes (Signed)
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 Last used earlier today.

## 2012-05-30 NOTE — ED Notes (Signed)
Pt. Belongings placed in psych ed locker 39.  Pt. Came to psych ed with ring, gold and clear stones, placed in plastic bag with pt.'s name sticker on it and added to belongings in locker 39.  Pt. Informed of this.

## 2012-05-30 NOTE — ED Provider Notes (Signed)
History     CSN: [id number]  Arrival date & time 05/29/12  2351   First MD Initiated Contact with Patient 05/30/12 0231      No chief complaint on file.   (Consider location/radiation/quality/duration/timing/severity/associated sxs/prior treatment) HPI Comments: Patient is here requesting detox from [facility] and Cocaine.  She reports daily use of heroine for the past 4 years.  She states that she uses cocaine a couple of times a week.  She states that she went through detox 3-4 months ago.  She denies HI or SI.  Denies AH or VH.  Last heroine use was Wednesday morning.  Last cocaine use was Tuesday.  She denies alcohol use.  She is not experiencing any symptoms of withdrawal at this time.    The history is provided by the patient.    Past Medical History  Diagnosis Date  . Bipolar 1 disorder   . Paranoid schizophrenia     Past Surgical History  Procedure Date  . Cesarean section     No family history on file.  History  Substance Use Topics  . Smoking status: Current Everyday Smoker -- 1.0 packs/day    Types: Cigarettes  . Smokeless tobacco: Not on file  . Alcohol Use: Yes     Occasional    OB History    Grav Para Term Preterm Abortions TAB SAB Ect Mult Living                  Review of Systems  Constitutional: Negative for fever and chills.  Respiratory: Negative for shortness of breath.   Gastrointestinal: Negative for nausea and vomiting.  Neurological: Negative for tremors and syncope.  Psychiatric/Behavioral: Negative for suicidal ideas, hallucinations, confusion, sleep disturbance and self-injury. The patient is not nervous/anxious.     Allergies  Ciprofloxacin  Home Medications   Current Outpatient Rx  Name Route Sig Dispense Refill  . CITALOPRAM HYDROBROMIDE 20 MG PO TABS Oral Take 20 mg by mouth daily.      BP 97/55  Pulse 103  Temp(Src) 98 F (36.7 C) (Oral)  Resp 18  SpO2 100%  Physical Exam  Nursing note and vitals  reviewed. Constitutional: She appears well-developed and well-nourished. No distress.  HENT:  Head: Normocephalic and atraumatic.  Mouth/Throat: Oropharynx is clear and moist.  Eyes: EOM are normal. Pupils are equal, round, and reactive to light.  Neck: Normal range of motion. Neck supple.  Cardiovascular: Normal rate, regular rhythm and normal heart sounds.   Pulmonary/Chest: Effort normal and breath sounds normal.  Abdominal: Soft. There is no tenderness.  Neurological: She is alert.  Skin: Skin is warm and dry. She is not diaphoretic.  Psychiatric: She has a normal mood and affect. Her speech is normal and behavior is normal. Thought content is not paranoid and not delusional. She expresses no homicidal and no suicidal ideation. She expresses no suicidal plans and no homicidal plans.    ED Course  Procedures (including critical care time)  Labs Reviewed  URINALYSIS, ROUTINE W REFLEX MICROSCOPIC - Abnormal; Notable for the following:    APPearance TURBID (*)    Leukocytes, UA SMALL (*)    All other components within normal limits  CBC - Abnormal; Notable for the following:    RBC 5.53 (*)    MCH 25.5 (*)    All other components within normal limits  COMPREHENSIVE METABOLIC PANEL - Abnormal; Notable for the following:    Glucose, Bld 114 (*)    AST 305 (*)  ALT 382 (*)    Alkaline Phosphatase 148 (*)    All other components within normal limits  SALICYLATE LEVEL - Abnormal; Notable for the following:    Salicylate Lvl <2.0 (*)    All other components within normal limits  URINE MICROSCOPIC-ADD ON - Abnormal; Notable for the following:    Squamous Epithelial / LPF MANY (*)    Bacteria, UA MANY (*)    All other components within normal limits  ETHANOL  ACETAMINOPHEN LEVEL   No results found.   No diagnosis found.  Discussed with ACT team.  They report that they will evaluate patient.    MDM  Patient comes in today requesting detox from cocaine and heroine.  Last  heroine use was approximately 11 hours prior to arrival in the ED.  She denies any symptoms of withdrawal at this point.  No SI or HI.  No hallucinations.  Patient discussed with ACT team who will come evaluate patient and determine placement.  Opiate detox orders and psych holding orders have been placed.        Pascal Lux [place], PA-C 05/30/12 [phone]

## 2012-05-30 NOTE — BH Assessment (Signed)
Assessment Note   Briana Zhang is an 31 y.o. female. PT PRESENTS WITH INCREASE DEPRESSION & INTOXICATED REQUESTING DETOX. PT EXPRESSED THAT SHE HAD BEEN USING HEROINE DAILY APPROX $40 -$100. PT SAYS HER LAST USE WAS 05/29/12 & IS CURRENTLY EXPERIENCING SOME WITHDRAWAL SYMPTOMS. PT DENIES ANY IDEATION & IS ABLE TO CONTRACT FOR SAFETY. PT ADMITS TO A HX OF TX AT [facility]. PT EXPRESSED THAT SHE WAS TIRED OF USING & WANTED HELP. THE LONGEST SHE HAS EVER BEEN SOBER 5 MONTH.   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 Bipolar 1 disorder   . Paranoid schizophrenia    Axis IV: economic problems, other psychosocial or environmental problems and problems related to social environment Axis V: 41-50 serious symptoms  Past Medical History:  Past Medical History  Diagnosis Date  . Bipolar 1 disorder   . Paranoid schizophrenia     Past Surgical History  Procedure Date  . Cesarean section     Family History: No family history on file.  Social History:  reports that she has been smoking Cigarettes.  She has been smoking about 1 pack per day. She does not have any smokeless tobacco history on file. She reports that she drinks alcohol. She reports that she uses illicit drugs (Cocaine and Marijuana).  Additional Social History:     CIWA: CIWA-Ar BP: 92/61 mmHg Pulse Rate: 73  Nausea and Vomiting: no nausea and no vomiting Tactile Disturbances: none Tremor: no tremor Auditory Disturbances: not present Paroxysmal Sweats: no sweat visible Visual Disturbances: not present Anxiety: two Headache, Fullness in Head: very mild Agitation: normal activity Orientation and Clouding of Sensorium: oriented and can do serial additions CIWA-Ar Total: 3  COWS:    Allergies:  Allergies  Allergen Reactions  . Ciprofloxacin     Rashes "breaks her out"    Home Medications:  (Not in a hospital  admission)  OB/GYN Status:  No LMP recorded. Patient is not currently having periods (Reason: Other).  General Assessment Data Location of Assessment: [facility] ED ACT Assessment: Yes Living Arrangements: Non-relatives/Friends Can pt return to current living arrangement?: Yes Admission Status: Voluntary Is patient capable of signing voluntary admission?: Yes Transfer from: Acute Hospital Referral Source: MD     Risk to self Suicidal Ideation: No Suicidal Intent: No Is patient at risk for suicide?: No Suicidal Plan?: No Access to Means: No What has been your use of drugs/alcohol within the last 12 months?: PT ADMITS TO ABUSING HEROINE & HAS BEEN USING $40 - $100 DAILY & LAST USE WAS 05/29/12 Previous Attempts/Gestures: No How many times?: 0  Other Self Harm Risks: NA Triggers for Past Attempts: Unpredictable Intentional Self Injurious Behavior: None Family Suicide History: No Recent stressful life event(s): Financial Problems;Turmoil (Comment) Persecutory voices/beliefs?: No Depression: Yes Depression Symptoms: Loss of interest in usual pleasures Substance abuse history and/or treatment for substance abuse?: Yes Suicide prevention information given to non-admitted patients: Not applicable  Risk to Others Homicidal Ideation: No Thoughts of Harm to Others: No Current Homicidal Intent: No Current Homicidal Plan: No Access to Homicidal Means: No Identified Victim: NA History of harm to others?: No Assessment of Violence: None Noted Violent Behavior Description: CALM, COOPERATIVE Does patient have access to weapons?: No Criminal Charges Pending?: No Does patient have a court date: No  Psychosis Hallucinations: None noted Delusions: None noted  Mental Status Report Appear/Hygiene: Improved Eye Contact: Good Motor  Activity: Freedom of movement Speech: Logical/coherent Level of Consciousness: Alert Mood: Depressed;Anhedonia;Guilty;Despair;Sad;Helpless Affect: Appropriate to  circumstance;Depressed;Sad;Anxious Anxiety Level: Minimal Thought Processes: Coherent;Relevant Judgement: Impaired Orientation: Person;Place;Time;Situation Obsessive Compulsive Thoughts/Behaviors: None  Cognitive Functioning Concentration: Decreased Memory: Recent Intact;Remote Intact IQ: Average Insight: Poor Impulse Control: Poor Appetite: Fair Weight Loss: 0  Weight Gain: 0  Sleep: No Change Total Hours of Sleep: 8  Vegetative Symptoms: None  ADLScreening [facility] Assessment Services) Patient's cognitive ability adequate to safely complete daily activities?: Yes Patient able to express need for assistance with ADLs?: Yes Independently performs ADLs?: Yes  Abuse/Neglect [facility]) Physical Abuse: Denies Verbal Abuse: Denies Sexual Abuse: Denies  Prior Inpatient Therapy Prior Inpatient Therapy: Yes Prior Therapy Dates: 2012, 2009 Prior Therapy Facilty/Provider(s): [facility] Reason for Treatment: DETOX  Prior Outpatient Therapy Prior Outpatient Therapy: No Prior Therapy Dates: NA Prior Therapy Facilty/Provider(s): NA Reason for Treatment: NA  ADL Screening (condition at time of admission) Patient's cognitive ability adequate to safely complete daily activities?: Yes Patient able to express need for assistance with ADLs?: Yes Independently performs ADLs?: Yes       Abuse/Neglect Assessment (Assessment to be complete while patient is alone) Physical Abuse: Denies Verbal Abuse: Denies Sexual Abuse: Denies Values / Beliefs Cultural Requests During Hospitalization: None Spiritual Requests During Hospitalization: None        Additional Information 1:1 In Past 12 Months?: No CIRT Risk: No Elopement Risk: No Does patient have medical clearance?: Yes     Disposition:  Disposition Disposition of Patient: Inpatient treatment program;Referred to ([facility]) Type of inpatient treatment program: Adult Patient referred to: [facility]  On Site Evaluation by:   Reviewed  with Physician:     Waldron Session 05/30/2012 9:29 AM

## 2012-05-30 NOTE — ED Provider Notes (Signed)
Patient relates she's here for heroin addiction. Relates she is feeling rough, she is having abdominal cramps. I have asked nurses to check her medications.   Devoria Albe, MD, FACEP   Ward Givens, MD 05/30/12 [phone]

## 2012-05-30 NOTE — ED Provider Notes (Signed)
Medical screening examination/treatment/procedure(s) were performed by non-physician practitioner and as supervising physician I was immediately available for consultation/collaboration.   Rosezetta Balderston, MD 05/30/12 0706 

## 2012-05-30 NOTE — ED Notes (Signed)
1319 and 1317 ed notes and primary assessment done in error.

## 2012-05-31 NOTE — ED Notes (Signed)
Pt. Came to RN window and informed this Clinical research associate that she had just called someone to come pick her up that she wanted to be D/C'ed.  Informed ACT of pt.'s request, given AMA form for pt. To sign.  Pt. Read form and RN informed pt. Of AMA action.  Pt. Signed AMA form.  Notified EDP of pt.'s request to be D/C'ed AMA, EDP placed pt. Up for D/C.

## 2012-05-31 NOTE — [facility] Counselor (Signed)
[facility] has no female beds but may have discharges 6/7 AM. Belenda Cruise states she will keep pt's information and have day shift review information.

## 2012-05-31 NOTE — ED Provider Notes (Addendum)
The patient is alert, and states that she is still going through withdrawal from heroin use, the last being about 24 hours ago. She continues to request an inpatient treatment setting for her addiction. She has an appropriate treatment plan with clonidine and Ativan being given.  Flint Melter, MD 05/31/12 1023  Patient decided to leave AMA and not wait for her bed that is being worked on at [facility].    Flint Melter, MD 05/31/12 1233

## 2012-07-17 ENCOUNTER — Emergency Department (HOSPITAL_BASED_OUTPATIENT_CLINIC_OR_DEPARTMENT_OTHER)
Admission: EM | Admit: 2012-07-17 | Discharge: 2012-07-17 | Disposition: A | Discharge status: Home Health | Payer: Self-pay | Attending: Emergency Medicine | Admitting: Emergency Medicine

## 2012-07-17 ENCOUNTER — Emergency Department (HOSPITAL_BASED_OUTPATIENT_CLINIC_OR_DEPARTMENT_OTHER): Payer: Self-pay

## 2012-07-17 ENCOUNTER — Encounter (HOSPITAL_BASED_OUTPATIENT_CLINIC_OR_DEPARTMENT_OTHER): Payer: Self-pay | Admitting: *Deleted

## 2012-07-17 DIAGNOSIS — Y9367 Activity, basketball: Secondary | ICD-10-CM | POA: Insufficient documentation

## 2012-07-17 DIAGNOSIS — W19XXXA Unspecified fall, initial encounter: Secondary | ICD-10-CM | POA: Insufficient documentation

## 2012-07-17 DIAGNOSIS — S99929A Unspecified injury of unspecified foot, initial encounter: Secondary | ICD-10-CM | POA: Insufficient documentation

## 2012-07-17 DIAGNOSIS — F17200 Nicotine dependence, unspecified, uncomplicated: Secondary | ICD-10-CM | POA: Insufficient documentation

## 2012-07-17 DIAGNOSIS — F200 Paranoid schizophrenia: Secondary | ICD-10-CM | POA: Insufficient documentation

## 2012-07-17 DIAGNOSIS — S8990XA Unspecified injury of unspecified lower leg, initial encounter: Secondary | ICD-10-CM | POA: Insufficient documentation

## 2012-07-17 DIAGNOSIS — F319 Bipolar disorder, unspecified: Secondary | ICD-10-CM | POA: Insufficient documentation

## 2012-07-17 MED ORDER — IBUPROFEN 800 MG PO TABS
800.0000 mg | ORAL_TABLET | Freq: Once | ORAL | Status: AC
Start: 2012-07-17 — End: 2012-07-17
  Administered 2012-07-17: 800 mg via ORAL
  Filled 2012-07-17: qty 1

## 2012-07-17 NOTE — ED Notes (Signed)
Patient states she was playing volleyball yesterday and landed on her right knee wrong.  C/O pain and swelling right knee.

## 2012-07-17 NOTE — ED Provider Notes (Signed)
History     CSN: [id number]  Arrival date & time 07/17/12  4098   First MD Initiated Contact with Patient 07/17/12 [phone]      Chief Complaint  Patient presents with  . Knee Injury    right    (Consider location/radiation/quality/duration/timing/severity/associated sxs/prior treatment) HPI  Patient with right knee injury after landing on rle playing basketball yesterday.  Patient able to bear some weight but painful and using crutches.  No other injuries.  Some swelling noted by patient, no laceration or abrasion, no numbness or tingling.   Past Medical History  Diagnosis Date  . Bipolar 1 disorder   . Paranoid schizophrenia     Past Surgical History  Procedure Date  . Cesarean section     No family history on file.  History  Substance Use Topics  . Smoking status: Current Everyday Smoker -- 1.0 packs/day    Types: Cigarettes  . Smokeless tobacco: Not on file  . Alcohol Use: Yes     Occasional    OB History    Grav Para Term Preterm Abortions TAB SAB Ect Mult Living                  Review of Systems  All other systems reviewed and are negative.    Allergies  Ciprofloxacin  Home Medications   Current Outpatient Rx  Name Route Sig Dispense Refill  . CITALOPRAM HYDROBROMIDE 20 MG PO TABS Oral Take 20 mg by mouth daily.      BP 114/64  Pulse 66  Temp 98.3 F (36.8 C) (Oral)  Resp 20  Ht 5\' 2"  (1.575 m)  Wt 125 lb (56.7 kg)  BMI 22.86 kg/m2  SpO2 100%  LMP 06/26/2012  Physical Exam  Nursing note and vitals reviewed. Constitutional: She is oriented to person, place, and time. She appears well-developed and well-nourished.  HENT:  Head: Normocephalic and atraumatic.  Eyes: Pupils are equal, round, and reactive to light.  Neck: Normal range of motion.  Pulmonary/Chest: Effort normal.  Abdominal: Soft.  Musculoskeletal:       Right knee with small effusion, no external signs of trauma.  Moderate tenderness medial aspect.  Full arom, no signs  of ligament laxity on varus or valgus stress, drawer sign negative.  DP and pt 2 + with sensation intact throughout le distal to injury.   Neurological: She is alert and oriented to person, place, and time.  Skin: Skin is warm and dry.  Psychiatric: She has a normal mood and affect.    ED Course  Procedures (including critical care time)  Labs Reviewed - No data to display No results found.   No diagnosis found.    MDM  Dg Knee Complete 4 Views Right  07/17/2012  *RADIOLOGY REPORT*  Clinical Data: Knee injury playing volleyball.  Swelling  RIGHT KNEE - COMPLETE 4+ VIEW  Comparison:  None.  Findings:  There is no evidence of fracture, dislocation, or joint effusion.  There is no evidence of arthropathy or other focal bone abnormality.  Soft tissues are unremarkable.  IMPRESSION: Negative.  Original Report Authenticated By: Elsie Stain, M.D.    No fx seen.  Plan immoblizer and follow up with ortho.        Hilario Quarry, MD 07/17/12 [phone]

## 2012-07-23 ENCOUNTER — Emergency Department (HOSPITAL_BASED_OUTPATIENT_CLINIC_OR_DEPARTMENT_OTHER)
Admission: EM | Admit: 2012-07-23 | Discharge: 2012-07-23 | Disposition: A | Discharge status: Home / Self Care | Payer: Self-pay | Attending: Emergency Medicine | Admitting: Emergency Medicine

## 2012-07-23 ENCOUNTER — Encounter (HOSPITAL_BASED_OUTPATIENT_CLINIC_OR_DEPARTMENT_OTHER): Payer: Self-pay | Admitting: Emergency Medicine

## 2012-07-23 DIAGNOSIS — F17200 Nicotine dependence, unspecified, uncomplicated: Secondary | ICD-10-CM | POA: Insufficient documentation

## 2012-07-23 DIAGNOSIS — Y9368 Activity, volleyball (beach) (court): Secondary | ICD-10-CM | POA: Insufficient documentation

## 2012-07-23 DIAGNOSIS — N760 Acute vaginitis: Secondary | ICD-10-CM | POA: Insufficient documentation

## 2012-07-23 DIAGNOSIS — B9689 Other specified bacterial agents as the cause of diseases classified elsewhere: Secondary | ICD-10-CM | POA: Insufficient documentation

## 2012-07-23 DIAGNOSIS — IMO0002 Reserved for concepts with insufficient information to code with codable children: Secondary | ICD-10-CM | POA: Insufficient documentation

## 2012-07-23 DIAGNOSIS — A499 Bacterial infection, unspecified: Secondary | ICD-10-CM | POA: Insufficient documentation

## 2012-07-23 DIAGNOSIS — F200 Paranoid schizophrenia: Secondary | ICD-10-CM | POA: Insufficient documentation

## 2012-07-23 DIAGNOSIS — X58XXXA Exposure to other specified factors, initial encounter: Secondary | ICD-10-CM | POA: Insufficient documentation

## 2012-07-23 DIAGNOSIS — S86919A Strain of unspecified muscle(s) and tendon(s) at lower leg level, unspecified leg, initial encounter: Secondary | ICD-10-CM

## 2012-07-23 DIAGNOSIS — F319 Bipolar disorder, unspecified: Secondary | ICD-10-CM | POA: Insufficient documentation

## 2012-07-23 LAB — URINALYSIS, ROUTINE W REFLEX MICROSCOPIC
Nitrite: NEGATIVE
Protein, ur: NEGATIVE mg/dL
Urobilinogen, UA: 0.2 mg/dL (ref 0.0–1.0)

## 2012-07-23 LAB — URINE MICROSCOPIC-ADD ON

## 2012-07-23 MED ORDER — METRONIDAZOLE 500 MG PO TABS
2000.0000 mg | ORAL_TABLET | Freq: Once | ORAL | Status: AC
  Administered 2012-07-23: 2000 mg via ORAL
  Filled 2012-07-23: qty 4

## 2012-07-23 MED ORDER — TRAMADOL HCL 50 MG PO TABS: 50.0000 mg | ORAL_TABLET | Freq: Four times a day (QID) | ORAL | Status: AC | PRN

## 2012-07-23 NOTE — ED Notes (Signed)
Pt c/o vaginal discharge w/ foul odor x 1 mo

## 2012-07-23 NOTE — ED Provider Notes (Signed)
History     CSN: [id number]  Arrival date & time 07/23/12  0825   First MD Initiated Contact with Patient 07/23/12 [phone]      Chief Complaint  Patient presents with  . Vaginal Discharge    (Consider location/radiation/quality/duration/timing/severity/associated sxs/prior treatment) HPI Comments: Patient complains of foul-smelling white vaginal discharge for the past month. She's had bacterial vaginosis in the past and thinks that is what it is again. She denies any fever, vomiting, abdominal pain or back pain. No dysuria or hematuria. She is not known last menstrual period was. She also complains of right knee pain after injuring it playing volleyball one week ago. She was seen in the ED and had negative x-rays and referred to orthopedics. She's been taking Motrin with some relief.  The history is provided by the patient.    Past Medical History  Diagnosis Date  . Bipolar 1 disorder   . Paranoid schizophrenia     Past Surgical History  Procedure Date  . Cesarean section     No family history on file.  History  Substance Use Topics  . Smoking status: Current Everyday Smoker -- 1.0 packs/day    Types: Cigarettes  . Smokeless tobacco: Not on file  . Alcohol Use: No    OB History    Grav Para Term Preterm Abortions TAB SAB Ect Mult Living                  Review of Systems  Constitutional: Negative for fever and activity change.  Respiratory: Negative for chest tightness.   Cardiovascular: Negative for chest pain.  Gastrointestinal: Negative for nausea, vomiting and abdominal pain.  Genitourinary: Positive for vaginal discharge. Negative for dysuria, hematuria and pelvic pain.  Musculoskeletal: Positive for arthralgias.  Neurological: Negative for dizziness, weakness and headaches.    Allergies  Ciprofloxacin  Home Medications   Current Outpatient Rx  Name Route Sig Dispense Refill  . MULTIVITAMIN PO Oral Take 1 tablet by mouth daily.    Marland Kitchen CITALOPRAM  HYDROBROMIDE 20 MG PO TABS Oral Take 20 mg by mouth daily.    . TRAMADOL HCL 50 MG PO TABS Oral Take 1 tablet (50 mg total) by mouth every 6 (six) hours as needed for pain. 15 tablet 0    BP 109/68  Pulse 79  Temp 98.3 F (36.8 C) (Oral)  Resp 16  Ht 5\' 1"  (1.549 m)  Wt 125 lb (56.7 kg)  BMI 23.62 kg/m2  SpO2 100%  LMP 06/26/2012  Physical Exam  Constitutional: She is oriented to person, place, and time. She appears well-developed and well-nourished. No distress.  HENT:  Head: Normocephalic and atraumatic.  Mouth/Throat: Oropharynx is clear and moist. No oropharyngeal exudate.  Eyes: Conjunctivae and EOM are normal. Pupils are equal, round, and reactive to light.  Neck: Normal range of motion. Neck supple.  Cardiovascular: Normal rate, regular rhythm and normal heart sounds.   Pulmonary/Chest: Effort normal and breath sounds normal. No respiratory distress.  Abdominal: Soft. There is no tenderness. There is no rebound and no guarding.  Genitourinary: Cervix exhibits discharge. Cervix exhibits no motion tenderness and no friability. Right adnexum displays no mass and no tenderness. Left adnexum displays no mass and no tenderness. Vaginal discharge found.  Musculoskeletal: Normal range of motion. She exhibits tenderness.       Tenderness to palpation over right medial knee with small effusion. No overlying skin change or cellulitis. Flexion and extension intact. +2 DPPT pulses. No ligament laxity to varus,  valgus, or drawer stress. Sensation intact. Cap refill <2 sec  Neurological: She is alert and oriented to person, place, and time. No cranial nerve deficit.  Skin: Skin is warm.    ED Course  Procedures (including critical care time)  Labs Reviewed  URINALYSIS, ROUTINE W REFLEX MICROSCOPIC - Abnormal; Notable for the following:    APPearance CLOUDY (*)     Leukocytes, UA TRACE (*)     All other components within normal limits  WET PREP, GENITAL - Abnormal; Notable for the  following:    Clue Cells Wet Prep HPF POC MANY (*)     WBC, Wet Prep HPF POC FEW (*)     All other components within normal limits  URINE MICROSCOPIC-ADD ON - Abnormal; Notable for the following:    Squamous Epithelial / LPF FEW (*)     Bacteria, UA FEW (*)     All other components within normal limits  PREGNANCY, URINE  GC/CHLAMYDIA PROBE AMP, GENITAL   No results found.   1. Bacterial vaginosis   2. Knee strain       MDM  Vaginal discharge without abdominal pain or systemic symptoms.  Vitals stable.  Knee strain, FROM, NVI. Knee immobilizer, ortho followup.  UA negative, HCG negative.  Will treat for BV. Follow up with ortho as scheduled. Return precautions discussed.     Glynn Octave, MD 07/23/12 [phone]

## 2012-07-24 LAB — GC/CHLAMYDIA PROBE AMP, GENITAL: GC Probe Amp, Genital: NEGATIVE

## 2013-01-28 ENCOUNTER — Emergency Department (HOSPITAL_COMMUNITY)
Admission: EM | Admit: 2013-01-28 | Discharge: 2013-01-28 | Disposition: A | Discharge status: Home / Self Care | Payer: Self-pay | Attending: Emergency Medicine | Admitting: Emergency Medicine

## 2013-01-28 ENCOUNTER — Encounter (HOSPITAL_COMMUNITY): Payer: Self-pay | Admitting: *Deleted

## 2013-01-28 DIAGNOSIS — L0201 Cutaneous abscess of face: Secondary | ICD-10-CM | POA: Insufficient documentation

## 2013-01-28 DIAGNOSIS — L03211 Cellulitis of face: Secondary | ICD-10-CM | POA: Insufficient documentation

## 2013-01-28 DIAGNOSIS — Z8659 Personal history of other mental and behavioral disorders: Secondary | ICD-10-CM | POA: Insufficient documentation

## 2013-01-28 DIAGNOSIS — F319 Bipolar disorder, unspecified: Secondary | ICD-10-CM | POA: Insufficient documentation

## 2013-01-28 DIAGNOSIS — R509 Fever, unspecified: Secondary | ICD-10-CM | POA: Insufficient documentation

## 2013-01-28 DIAGNOSIS — F17200 Nicotine dependence, unspecified, uncomplicated: Secondary | ICD-10-CM | POA: Insufficient documentation

## 2013-01-28 HISTORY — DX: Cellulitis, unspecified: L03.90

## 2013-01-28 HISTORY — DX: Cutaneous abscess, unspecified: L02.91

## 2013-01-28 MED ORDER — OXYCODONE-ACETAMINOPHEN 5-325 MG PO TABS
1.0000 | ORAL_TABLET | Freq: Once | ORAL | Status: AC
  Administered 2013-01-28: 1 via ORAL
  Filled 2013-01-28: qty 1

## 2013-01-28 MED ORDER — HYDROCODONE-ACETAMINOPHEN 5-325 MG PO TABS: 1.0000 | ORAL_TABLET | Freq: Four times a day (QID) | ORAL | Status: DC | PRN

## 2013-01-28 MED ORDER — SULFAMETHOXAZOLE-TRIMETHOPRIM 800-160 MG PO TABS: 1.0000 | ORAL_TABLET | Freq: Two times a day (BID) | ORAL | Status: DC

## 2013-01-28 NOTE — ED Notes (Signed)
Pt from home with reports of an abscess to middle of forehead that was first noticed 4 days ago. Pt reports applying warm compress and area began draining yesterday but continues to be red, swollen and painful. Pt endorses hx of same. Pt also reports that eyes are beginning to swell.

## 2013-01-28 NOTE — ED Provider Notes (Signed)
History     CSN: [id number]  Arrival date & time 01/28/13  0734   First MD Initiated Contact with Patient 01/28/13 [phone]      Chief Complaint  Patient presents with  . Abscess    (Consider location/radiation/quality/duration/timing/severity/associated sxs/prior treatment) Patient is a 32 y.o. female presenting with abscess. The history is provided by the patient.  Abscess  This is a new problem. Episode onset: 4 days ago. The onset was gradual. The problem occurs continuously. The problem has been gradually worsening. The abscess is present on the face. The problem is severe. The abscess is characterized by redness, painfulness, draining and swelling. It is unknown what she was exposed to. The abscess first occurred at home. Associated symptoms include a fever. Her past medical history is significant for skin abscesses in family. There were no sick contacts. She has received no recent medical care.    Past Medical History  Diagnosis Date  . Bipolar 1 disorder   . Paranoid schizophrenia   . Cellulitis and abscess     Past Surgical History  Procedure Date  . Cesarean section     History reviewed. No pertinent family history.  History  Substance Use Topics  . Smoking status: Current Every Day Smoker -- 1.0 packs/day    Types: Cigarettes  . Smokeless tobacco: Never Used  . Alcohol Use: No    OB History    Grav Para Term Preterm Abortions TAB SAB Ect Mult Living                  Review of Systems  Constitutional: Positive for fever.  All other systems reviewed and are negative.    Allergies  Ciprofloxacin  Home Medications   Current Outpatient Rx  Name  Route  Sig  Dispense  Refill  . ACETAMINOPHEN 500 MG PO TABS   Oral   Take 1,000 mg by mouth every 6 (six) hours as needed. For pain.         Marland Kitchen CITALOPRAM HYDROBROMIDE 20 MG PO TABS   Oral   Take 20 mg by mouth daily.           BP 109/80  Pulse 82  Temp 98 F (36.7 C) (Oral)  Resp 16  SpO2 98%   LMP 01/23/2013  Physical Exam  Nursing note and vitals reviewed. Constitutional: She is oriented to person, place, and time. She appears well-developed and well-nourished. No distress.  HENT:  Head: Normocephalic and atraumatic.    Mouth/Throat: Oropharynx is clear and moist.       Mild periorbital edema  Eyes: EOM are normal. Pupils are equal, round, and reactive to light.  Neck: Normal range of motion. Neck supple.  Pulmonary/Chest: Effort normal.  Neurological: She is alert and oriented to person, place, and time.  Skin: Skin is warm and dry. No rash noted.  Psychiatric: She has a normal mood and affect. Her behavior is normal.    ED Course  Procedures (including critical care time)  Labs Reviewed - No data to display No results found.  INCISION AND DRAINAGE Performed by: Gwyneth Sprout Consent: Verbal consent obtained. Risks and benefits: risks, benefits and alternatives were discussed Type: abscess  Body area: forehead  Anesthesia: local infiltration  Incision was made with a scalpel.  Local anesthetic: lidocaine 1% with epinephrine  Anesthetic total: 1 ml  Complexity: complex Blunt dissection to break up loculations  Drainage: purulent  Drainage amount: 1mL  Packing material: none  Patient tolerance: Patient tolerated the procedure  well with no immediate complications.     No diagnosis found.    MDM   Patient with an abscess of her 4 head with mild surrounding cellulitis. This area drained yesterday but has recurred and now is more painful. I&D as above and patient placed on Bactrim.        Gwyneth Sprout, MD 01/28/13 [phone]

## 2013-07-21 ENCOUNTER — Emergency Department (HOSPITAL_COMMUNITY)
Admission: EM | Admit: 2013-07-21 | Discharge: 2013-07-21 | Discharge status: Against Medical Advice | Payer: Self-pay | Attending: Emergency Medicine | Admitting: Emergency Medicine

## 2013-07-21 ENCOUNTER — Encounter (HOSPITAL_COMMUNITY): Payer: Self-pay | Admitting: Cardiology

## 2013-07-21 DIAGNOSIS — F1110 Opioid abuse, uncomplicated: Secondary | ICD-10-CM | POA: Insufficient documentation

## 2013-07-21 DIAGNOSIS — F17200 Nicotine dependence, unspecified, uncomplicated: Secondary | ICD-10-CM | POA: Insufficient documentation

## 2013-07-21 DIAGNOSIS — F319 Bipolar disorder, unspecified: Secondary | ICD-10-CM | POA: Insufficient documentation

## 2013-07-21 DIAGNOSIS — F200 Paranoid schizophrenia: Secondary | ICD-10-CM | POA: Insufficient documentation

## 2013-07-21 LAB — ETHANOL: Alcohol, Ethyl (B): <11 mg/dL (ref 0–11)

## 2013-07-21 LAB — COMPREHENSIVE METABOLIC PANEL
Alkaline Phosphatase: 101 U/L (ref 39–117)
CO2: 28 mEq/L (ref 19–32)
Calcium: 9.4 mg/dL (ref 8.4–10.5)
Creatinine, Ser: 0.7 mg/dL (ref 0.50–1.10)
GFR calc Af Amer: >90 mL/min (ref >90)
Sodium: 141 mEq/L (ref 135–145)
Total Bilirubin: 0.2 mg/dL — ABNORMAL LOW (ref 0.3–1.2)
Total Protein: 7 g/dL (ref 6.0–8.3)

## 2013-07-21 LAB — CBC
HCT: 42.5 % (ref 36.0–46.0)
Hemoglobin: 13.6 g/dL (ref 12.0–15.0)
MCH: 25.9 pg — ABNORMAL LOW (ref 26.0–34.0)
MCHC: 32 g/dL (ref 30.0–36.0)
MCV: 80.8 fL (ref 78.0–100.0)
Platelets: 292 10*3/uL (ref 150–400)
RBC: 5.26 MIL/uL — ABNORMAL HIGH (ref 3.87–5.11)
RDW: 14.4 % (ref 11.5–15.5)
WBC: 6.5 10*3/uL (ref 4.0–10.5)

## 2013-07-21 NOTE — ED Notes (Signed)
Pt called x1 for room placement w/no answer 

## 2013-07-21 NOTE — ED Notes (Signed)
Pt called x3 for room placement w/no answer

## 2013-07-21 NOTE — ED Notes (Signed)
Pt called x2 for room placement w/no answer

## 2013-07-21 NOTE — ED Notes (Signed)
Pt reports she wants detox from heroine. States she has been using for the past 5 years and last use was last night. Denies any other drugs or alcohol abuse. Denies any SI/HI. Pt calm and cooperative at triage.

## 2013-07-25 ENCOUNTER — Telehealth (HOSPITAL_COMMUNITY): Payer: Self-pay | Admitting: Licensed Clinical Social Worker

## 2013-07-25 ENCOUNTER — Emergency Department (HOSPITAL_COMMUNITY)
Admission: EM | Admit: 2013-07-25 | Discharge: 2013-07-25 | Disposition: A | Discharge status: Home / Self Care | Payer: Self-pay | Attending: Emergency Medicine | Admitting: Emergency Medicine

## 2013-07-25 ENCOUNTER — Encounter (HOSPITAL_COMMUNITY): Payer: Self-pay | Admitting: Emergency Medicine

## 2013-07-25 DIAGNOSIS — F319 Bipolar disorder, unspecified: Secondary | ICD-10-CM | POA: Insufficient documentation

## 2013-07-25 DIAGNOSIS — Z8659 Personal history of other mental and behavioral disorders: Secondary | ICD-10-CM | POA: Insufficient documentation

## 2013-07-25 DIAGNOSIS — F1110 Opioid abuse, uncomplicated: Secondary | ICD-10-CM | POA: Insufficient documentation

## 2013-07-25 DIAGNOSIS — Z79899 Other long term (current) drug therapy: Secondary | ICD-10-CM | POA: Insufficient documentation

## 2013-07-25 DIAGNOSIS — Z3202 Encounter for pregnancy test, result negative: Secondary | ICD-10-CM | POA: Insufficient documentation

## 2013-07-25 DIAGNOSIS — Z872 Personal history of diseases of the skin and subcutaneous tissue: Secondary | ICD-10-CM | POA: Insufficient documentation

## 2013-07-25 DIAGNOSIS — F17200 Nicotine dependence, unspecified, uncomplicated: Secondary | ICD-10-CM | POA: Insufficient documentation

## 2013-07-25 LAB — RAPID URINE DRUG SCREEN, HOSP PERFORMED
Amphetamines: NOT DETECTED
Barbiturates: NOT DETECTED
Benzodiazepines: NOT DETECTED
Cocaine: POSITIVE — AB
Opiates: POSITIVE — AB
Tetrahydrocannabinol: POSITIVE — AB

## 2013-07-25 LAB — CBC
HCT: 40.4 % (ref 36.0–46.0)
Hemoglobin: 13.3 g/dL (ref 12.0–15.0)
MCH: 26.7 pg (ref 26.0–34.0)
MCHC: 32.9 g/dL (ref 30.0–36.0)
MCV: 81 fL (ref 78.0–100.0)
Platelets: 244 10*3/uL (ref 150–400)
RBC: 4.99 MIL/uL (ref 3.87–5.11)
RDW: 14.6 % (ref 11.5–15.5)
WBC: 4.7 10*3/uL (ref 4.0–10.5)

## 2013-07-25 LAB — BASIC METABOLIC PANEL
BUN: 11 mg/dL (ref 6–23)
CO2: 24 mEq/L (ref 19–32)
Calcium: 9.4 mg/dL (ref 8.4–10.5)
Chloride: 107 mEq/L (ref 96–112)
Creatinine, Ser: 0.58 mg/dL (ref 0.50–1.10)
GFR calc Af Amer: >90 mL/min (ref >90)
GFR calc non Af Amer: >90 mL/min (ref >90)
Glucose, Bld: 111 mg/dL — ABNORMAL HIGH (ref 70–99)
Potassium: 4.2 mEq/L (ref 3.5–5.1)
Sodium: 139 mEq/L (ref 135–145)

## 2013-07-25 LAB — ETHANOL: Alcohol, Ethyl (B): <11 mg/dL (ref 0–11)

## 2013-07-25 LAB — PREGNANCY, URINE: Preg Test, Ur: NEGATIVE

## 2013-07-25 MED ORDER — CLONIDINE HCL 0.1 MG PO TABS: 0.1000 mg | ORAL_TABLET | Freq: Every day | ORAL | Status: DC

## 2013-07-25 MED ORDER — ZOLPIDEM TARTRATE 5 MG PO TABS: 5.0000 mg | ORAL_TABLET | Freq: Every evening | ORAL | Status: DC | PRN

## 2013-07-25 MED ORDER — ACETAMINOPHEN 325 MG PO TABS: 650.0000 mg | ORAL_TABLET | ORAL | Status: DC | PRN

## 2013-07-25 MED ORDER — ONDANSETRON HCL 4 MG PO TABS: 4.0000 mg | ORAL_TABLET | Freq: Three times a day (TID) | ORAL | Status: DC | PRN

## 2013-07-25 MED ORDER — CLONIDINE HCL 0.1 MG PO TABS: 0.1000 mg | ORAL_TABLET | Freq: Four times a day (QID) | ORAL | Status: DC

## 2013-07-25 MED ORDER — HYDROXYZINE HCL 25 MG PO TABS: 25.0000 mg | ORAL_TABLET | Freq: Four times a day (QID) | ORAL | Status: DC | PRN

## 2013-07-25 MED ORDER — ALUM & MAG HYDROXIDE-SIMETH 200-200-20 MG/5ML PO SUSP: 30.0000 mL | ORAL | Status: DC | PRN

## 2013-07-25 MED ORDER — LORAZEPAM 1 MG PO TABS: 1.0000 mg | ORAL_TABLET | Freq: Three times a day (TID) | ORAL | Status: DC | PRN

## 2013-07-25 MED ORDER — IBUPROFEN 400 MG PO TABS: 600.0000 mg | ORAL_TABLET | Freq: Three times a day (TID) | ORAL | Status: DC | PRN

## 2013-07-25 MED ORDER — NAPROXEN 250 MG PO TABS: 500.0000 mg | ORAL_TABLET | Freq: Two times a day (BID) | ORAL | Status: DC | PRN

## 2013-07-25 MED ORDER — NICOTINE 21 MG/24HR TD PT24: 21.0000 mg | MEDICATED_PATCH | Freq: Every day | TRANSDERMAL | Status: DC

## 2013-07-25 MED ORDER — CLONIDINE HCL 0.1 MG PO TABS: 0.1000 mg | ORAL_TABLET | ORAL | Status: DC

## 2013-07-25 MED ORDER — DICYCLOMINE HCL 20 MG PO TABS: 20.0000 mg | ORAL_TABLET | Freq: Four times a day (QID) | ORAL | Status: DC | PRN

## 2013-07-25 MED ORDER — LOPERAMIDE HCL 2 MG PO CAPS: 2.0000 mg | ORAL_CAPSULE | ORAL | Status: DC | PRN

## 2013-07-25 MED ORDER — METHOCARBAMOL 500 MG PO TABS: 500.0000 mg | ORAL_TABLET | Freq: Three times a day (TID) | ORAL | Status: DC | PRN

## 2013-07-25 MED ORDER — ONDANSETRON 4 MG PO TBDP: 4.0000 mg | ORAL_TABLET | Freq: Four times a day (QID) | ORAL | Status: DC | PRN

## 2013-07-25 NOTE — BH Assessment (Signed)
[facility] Assessment Progress Note   Pt left out the room prior to completing the assessment. She asked nurse for the phone. Following her phone call patient stood at the nurses station  discussing with nurse, "I am not ready to do this at this time". Writer exited patient's room where nurse/patient were continuing to discusspatient's discharge. Writer informed nurse to call if ACT phone if further assistance is needed. Writer notified the examining PA-Chris Lawyer that patient appears to not want help. He will discharge patient if she is not wanting detox services. Pt not given any referrals by ACT as she would not remain in the room to discuss additional substance abuse community referrals and/or options for treatment.

## 2013-07-25 NOTE — ED Provider Notes (Signed)
Medical screening examination/treatment/procedure(s) were performed by non-physician practitioner and as supervising physician I was immediately available for consultation/collaboration.    Nelia Shi, MD 07/25/13 1324

## 2013-07-25 NOTE — ED Notes (Signed)
Called lab and added on a urine preg.

## 2013-07-25 NOTE — ED Provider Notes (Deleted)
Medical screening examination/treatment/procedure(s) were performed by non-physician practitioner and as supervising physician I was immediately available for consultation/collaboration.    Nelia Shi, MD 07/25/13 1314  Nelia Shi, MD 07/25/13 1323

## 2013-07-25 NOTE — BH Assessment (Signed)
Assessment Note     Briana Zhang is an 32 y.o. female. Pt presents to [facility] seeking detox. She reports Heroin and THC use. Her UDS was positive for both in addition to cocaine. Pt did not report use cocaine nor did she provide any details. She did however, report using Heroin daily for the past 2-3 months. She started using at ate 18. She reports using 1/2gram to 1 gram each day. Pt's last use was midnight. Patient also using THC 2-3x's per week since age 87. She reports smoking 1 joint during each use. She last used yesterday. She denies any withdrawal symptoms at this time. Pt has received detox at [facility] and treatment at [facility] in the past. Consequences of patient's use include a court date with related drug charges, loosing custody of her children, unemployment, and conflict with family.   Patient denies SI, HI, and AVH's.   Pt left out the room prior to completing the assessment. She asked nurse for the phone. Following her phone call patient stood at the nurses station to discussing with nurse, "I am not ready to do this at this time". Writer exited patient's room where nurse/patient were discussing patient's discharge. Writer informed nurse to call if ACT phone if further assistance is needed. Writer notified the examining PA-Chris Lawyer who will discharge patient if she is not wanting detox services. Pt not given any referrals by ACT as she would not remain in the room to discuss additional substance abuse community referrals and/or options for treatment.         Axis I: Polysubstance Abuse Axis II: Deferred Axis III:  Past Medical History  Diagnosis Date  . Bipolar 1 disorder   . Paranoid schizophrenia   . Cellulitis and abscess    Axis IV: other psychosocial or environmental problems, problems with access to health care services and problems with primary support group Axis V: 65  Past Medical History:  Past Medical History  Diagnosis Date  . Bipolar 1 disorder   . Paranoid  schizophrenia   . Cellulitis and abscess     Past Surgical History  Procedure Laterality Date  . Cesarean section      Family History: No family history on file.  Social History:  reports that she has been smoking Cigarettes.  She has been smoking about 1.00 pack per day. She has never used smokeless tobacco. She reports that she uses illicit drugs (Cocaine, Marijuana, and IV). She reports that she does not drink alcohol.  Additional Social History:     CIWA:   COWS:    Allergies:  Allergies  Allergen Reactions  . Ciprofloxacin Swelling and Rash    Rashes "breaks her out"    Home Medications:  (Not in a hospital admission)  OB/GYN Status:  No LMP recorded.         [facility] ASSESSMENT WOULD NOT COPY IN TO [facility]'S CHART. PLEASE REFER TO THE FLOW SHEET COMPLETED 07/25/2013 @ 1441. WILL CONTACT ADMINISTRATION REGARDING THIS TECHNICAL ISSUE.   Arieona Swaggerty                                                  Disposition:     On Site Evaluation by:   Reviewed with Physician:     Melynda Ripple [facility] 07/25/2013 3:17 PM

## 2013-07-25 NOTE — ED Provider Notes (Addendum)
CSN: [id number]     Arrival date & time 07/25/13  1257 History     First MD Initiated Contact with Patient 07/25/13 1259     No chief complaint on file.  (Consider location/radiation/quality/duration/timing/severity/associated sxs/prior Treatment) HPI Patient presents to the emergency department for detox from heroin.  Patient, states she used heroin for the last 5 years.  Patient, states she also smokes marijuana and does cocaine.  Patient, states, that she does not have any thoughts of harming herself or others.  Patient denies chest pain, shortness of breath, fever, nausea, vomiting, diarrhea, headache, blurred vision, weakness, numbness, dizziness, hallucinations, or syncope. patient, states, that her last use of heroin was last night patient states she normally does and a half gram to 1 g of heroin a day. Past Medical History  Diagnosis Date  . Bipolar 1 disorder   . Paranoid schizophrenia   . Cellulitis and abscess    Past Surgical History  Procedure Laterality Date  . Cesarean section     No family history on file. History  Substance Use Topics  . Smoking status: Current Every Day Smoker -- 1.00 packs/day    Types: Cigarettes  . Smokeless tobacco: Never Used  . Alcohol Use: No   OB History   Grav Para Term Preterm Abortions TAB SAB Ect Mult Living                 Review of Systems All other systems negative except as documented in the HPI. All pertinent positives and negatives as reviewed in the HPI. Allergies  Ciprofloxacin  Home Medications   Current Outpatient Rx  Name  Route  Sig  Dispense  Refill  . acetaminophen (TYLENOL) 500 MG tablet   Oral   Take 1,000 mg by mouth every 6 (six) hours as needed. For pain.         . citalopram (CELEXA) 20 MG tablet   Oral   Take 20 mg by mouth daily.         Marland Kitchen HYDROcodone-acetaminophen (NORCO/VICODIN) 5-325 MG per tablet   Oral   Take 1 tablet by mouth every 6 (six) hours as needed for pain.   10 tablet   0    . sulfamethoxazole-trimethoprim (SEPTRA DS) 800-160 MG per tablet   Oral   Take 1 tablet by mouth every 12 (twelve) hours.   10 tablet   0    There were no vitals taken for this visit. Physical Exam  Nursing note and vitals reviewed. Constitutional: She is oriented to person, place, and time. She appears well-developed and well-nourished. No distress.  HENT:  Head: Normocephalic and atraumatic.  Mouth/Throat: Oropharynx is clear and moist.  Eyes: Pupils are equal, round, and reactive to light.  Neck: Normal range of motion. Neck supple.  Cardiovascular: Normal rate, regular rhythm, normal heart sounds and intact distal pulses.   Pulmonary/Chest: Effort normal and breath sounds normal. No respiratory distress.  Neurological: She is alert and oriented to person, place, and time.  Skin: Skin is warm and dry.  Patient has a track mark in the right antecubital space has bruising noted.  There is no surrounding cellulitis or abscess    ED Course   Procedures (including critical care time)  Labs Reviewed  CBC  BASIC METABOLIC PANEL  URINE RAPID DRUG SCREEN (HOSP PERFORMED)  ETHANOL   Patient is advised of expected course here in the emergency Department.  I spoke with Melynda Ripple of the ACT Team and she will be  in to eval the patient.  MDM    Carlyle Dolly, PA-C 07/25/13 1306  Carlyle Dolly, PA-C 07/25/13 1320

## 2013-07-25 NOTE — ED Notes (Signed)
Patient has decided she is not ready to commit to detox. Encouraged her to return if she changes her mind

## 2013-07-25 NOTE — ED Notes (Signed)
Pt states she is a iv heroin user. She uses 100-200 dollars worth of heroin daily. States she also uses marijuana,cocaine and mdma. States she last used about 40 dollars worth of heroin last night around [place]. States she is not uncomfortable now.

## 2013-07-25 NOTE — ED Notes (Signed)
Pt arrives voluntary requesting detox from heroin. States last use at midnight. Pt calm, cooperative, NAD at present. Denies pain

## 2013-09-22 ENCOUNTER — Encounter (HOSPITAL_BASED_OUTPATIENT_CLINIC_OR_DEPARTMENT_OTHER): Payer: Self-pay

## 2013-09-22 ENCOUNTER — Emergency Department (HOSPITAL_BASED_OUTPATIENT_CLINIC_OR_DEPARTMENT_OTHER)
Admission: EM | Admit: 2013-09-22 | Discharge: 2013-09-22 | Disposition: A | Discharge status: Home / Self Care | Payer: PRIVATE HEALTH INSURANCE | Attending: Emergency Medicine | Admitting: Emergency Medicine

## 2013-09-22 DIAGNOSIS — Z872 Personal history of diseases of the skin and subcutaneous tissue: Secondary | ICD-10-CM | POA: Insufficient documentation

## 2013-09-22 DIAGNOSIS — F17200 Nicotine dependence, unspecified, uncomplicated: Secondary | ICD-10-CM | POA: Insufficient documentation

## 2013-09-22 DIAGNOSIS — A499 Bacterial infection, unspecified: Secondary | ICD-10-CM | POA: Insufficient documentation

## 2013-09-22 DIAGNOSIS — B9689 Other specified bacterial agents as the cause of diseases classified elsewhere: Secondary | ICD-10-CM | POA: Insufficient documentation

## 2013-09-22 DIAGNOSIS — Z3202 Encounter for pregnancy test, result negative: Secondary | ICD-10-CM | POA: Insufficient documentation

## 2013-09-22 DIAGNOSIS — Z79899 Other long term (current) drug therapy: Secondary | ICD-10-CM | POA: Insufficient documentation

## 2013-09-22 DIAGNOSIS — Z8659 Personal history of other mental and behavioral disorders: Secondary | ICD-10-CM | POA: Insufficient documentation

## 2013-09-22 DIAGNOSIS — N760 Acute vaginitis: Secondary | ICD-10-CM | POA: Insufficient documentation

## 2013-09-22 HISTORY — DX: Other psychoactive substance abuse, uncomplicated: F19.10

## 2013-09-22 LAB — URINE MICROSCOPIC-ADD ON

## 2013-09-22 LAB — URINALYSIS, ROUTINE W REFLEX MICROSCOPIC
Glucose, UA: NEGATIVE mg/dL
Hgb urine dipstick: NEGATIVE
Ketones, ur: NEGATIVE mg/dL
Protein, ur: NEGATIVE mg/dL
pH: 8 (ref 5.0–8.0)

## 2013-09-22 LAB — WET PREP, GENITAL

## 2013-09-22 MED ORDER — METRONIDAZOLE 500 MG PO TABS: 500.0000 mg | ORAL_TABLET | Freq: Two times a day (BID) | ORAL | Status: DC

## 2013-09-22 NOTE — ED Provider Notes (Signed)
CSN: [id number]     Arrival date & time 09/22/13  4098 History   First MD Initiated Contact with Patient 09/22/13 [phone]     Chief Complaint  Patient presents with  . Vaginal Discharge   (Consider location/radiation/quality/duration/timing/severity/associated sxs/prior Treatment) HPI Comments: Pt states that she is here for malodorous vaginal discharge:no abdominal pain, fever, dysuria:pt states that she has a history of bv and this is similar:no vomiting:white discharge  The history is provided by the patient. No language interpreter was used.    Past Medical History  Diagnosis Date  . Bipolar 1 disorder   . Paranoid schizophrenia   . Cellulitis and abscess   . Substance abuse    Past Surgical History  Procedure Laterality Date  . Cesarean section     No family history on file. History  Substance Use Topics  . Smoking status: Current Every Day Smoker -- 0.50 packs/day    Types: Cigarettes  . Smokeless tobacco: Never Used  . Alcohol Use: No   OB History   Grav Para Term Preterm Abortions TAB SAB Ect Mult Living                 Review of Systems  Constitutional: Negative.   Respiratory: Negative.   Cardiovascular: Negative.     Allergies  Ciprofloxacin  Home Medications   Current Outpatient Rx  Name  Route  Sig  Dispense  Refill  . metroNIDAZOLE (FLAGYL) 500 MG tablet   Oral   Take 1 tablet (500 mg total) by mouth 2 (two) times daily.   14 tablet   0    BP 121/73  Pulse 102  Temp(Src) 98.2 F (36.8 C) (Oral)  Resp 16  SpO2 100%  LMP 09/20/2013 Physical Exam  Nursing note and vitals reviewed. Constitutional: She is oriented to person, place, and time. She appears well-developed and well-nourished.  Cardiovascular: Normal rate and regular rhythm.   Pulmonary/Chest: Effort normal and breath sounds normal.  Abdominal: Soft. Bowel sounds are normal. There is no tenderness.  Genitourinary:  Thick white discharge:-cmt  Musculoskeletal: Normal range of  motion.  Neurological: She is alert and oriented to person, place, and time.  Skin: Skin is dry.    ED Course  Procedures (including critical care time) Labs Review Labs Reviewed  WET PREP, GENITAL - Abnormal; Notable for the following:    Clue Cells Wet Prep HPF POC MODERATE (*)    WBC, Wet Prep HPF POC MODERATE (*)    All other components within normal limits  URINALYSIS, ROUTINE W REFLEX MICROSCOPIC - Abnormal; Notable for the following:    APPearance CLOUDY (*)    Leukocytes, UA TRACE (*)    All other components within normal limits  URINE MICROSCOPIC-ADD ON - Abnormal; Notable for the following:    Bacteria, UA MANY (*)    All other components within normal limits  URINE CULTURE  GC/CHLAMYDIA PROBE AMP  PREGNANCY, URINE  RPR   Imaging Review No results found.  MDM   1. BV (bacterial vaginosis)    Will treat for YN:WGNF treat:cultures sent:urine culture sent as well as no clear infection    Teressa Lower, NP 09/22/13 [phone]

## 2013-09-22 NOTE — ED Notes (Signed)
Pelvic cart at bedside. 

## 2013-09-22 NOTE — ED Notes (Signed)
Pt reports vaginal d/c and odor x 1 month.

## 2013-09-23 LAB — URINE CULTURE: Culture: NO GROWTH

## 2013-09-23 LAB — GC/CHLAMYDIA PROBE AMP
CT Probe RNA: NEGATIVE
GC Probe RNA: NEGATIVE

## 2013-09-23 NOTE — ED Provider Notes (Signed)
Medical screening examination/treatment/procedure(s) were performed by non-physician practitioner and as supervising physician I was immediately available for consultation/collaboration.  Aarti Mankowski T Kajsa Butrum, MD 09/23/13 1518 

## 2013-10-01 ENCOUNTER — Emergency Department (HOSPITAL_BASED_OUTPATIENT_CLINIC_OR_DEPARTMENT_OTHER)
Admission: EM | Admit: 2013-10-01 | Discharge: 2013-10-01 | Disposition: A | Discharge status: Home / Self Care | Payer: PRIVATE HEALTH INSURANCE | Attending: Emergency Medicine | Admitting: Emergency Medicine

## 2013-10-01 ENCOUNTER — Encounter (HOSPITAL_BASED_OUTPATIENT_CLINIC_OR_DEPARTMENT_OTHER): Payer: Self-pay | Admitting: Emergency Medicine

## 2013-10-01 DIAGNOSIS — R21 Rash and other nonspecific skin eruption: Secondary | ICD-10-CM | POA: Insufficient documentation

## 2013-10-01 DIAGNOSIS — Z872 Personal history of diseases of the skin and subcutaneous tissue: Secondary | ICD-10-CM | POA: Insufficient documentation

## 2013-10-01 DIAGNOSIS — F200 Paranoid schizophrenia: Secondary | ICD-10-CM | POA: Insufficient documentation

## 2013-10-01 DIAGNOSIS — F17200 Nicotine dependence, unspecified, uncomplicated: Secondary | ICD-10-CM | POA: Insufficient documentation

## 2013-10-01 DIAGNOSIS — Z9889 Other specified postprocedural states: Secondary | ICD-10-CM | POA: Insufficient documentation

## 2013-10-01 DIAGNOSIS — F319 Bipolar disorder, unspecified: Secondary | ICD-10-CM | POA: Insufficient documentation

## 2013-10-01 MED ORDER — HYDROXYZINE HCL 25 MG PO TABS: 25.0000 mg | ORAL_TABLET | Freq: Four times a day (QID) | ORAL | Status: DC

## 2013-10-01 MED ORDER — PERMETHRIN 5 % EX CREA: TOPICAL_CREAM | CUTANEOUS | Status: DC

## 2013-10-01 MED ORDER — ACYCLOVIR 400 MG PO TABS: 400.0000 mg | ORAL_TABLET | Freq: Every day | ORAL | Status: DC

## 2013-10-01 NOTE — ED Notes (Signed)
Recently treated for bv completed treatment noticed several red painful bumps in vaginal area states lives in [facility] which is rehab house and reports several people there have been treated for boils and cellulitis

## 2013-10-01 NOTE — ED Provider Notes (Signed)
CSN: [id number]     Arrival date & time 10/01/13  1003 History   First MD Initiated Contact with Patient 10/01/13 1015     Chief Complaint  Patient presents with  . rash    (Consider location/radiation/quality/duration/timing/severity/associated sxs/prior Treatment) HPI Comments: Patient is a 32 year old female presents with complaints of noticing 2 small bumps on her external genitalia. She is sexually active. She reports she is staying in a rehabilitation house were other people have developed boils.  Patient is a 32 y.o. female presenting with rash. The history is provided by the patient.  Rash Location: Vagina. Quality: blistering and painful   Pain details:    Severity:  Moderate   Onset quality:  Sudden   Duration:  2 days   Timing:  Constant   Progression:  Worsening Severity:  Moderate Timing:  Constant Progression:  Worsening Chronicity:  New   Past Medical History  Diagnosis Date  . Bipolar 1 disorder   . Paranoid schizophrenia   . Cellulitis and abscess   . Substance abuse    Past Surgical History  Procedure Laterality Date  . Cesarean section     History reviewed. No pertinent family history. History  Substance Use Topics  . Smoking status: Current Every Day Smoker -- 0.50 packs/day    Types: Cigarettes  . Smokeless tobacco: Never Used  . Alcohol Use: No   OB History   Grav Para Term Preterm Abortions TAB SAB Ect Mult Living                 Review of Systems  Skin: Positive for rash.  All other systems reviewed and are negative.    Allergies  Ciprofloxacin  Home Medications   Current Outpatient Rx  Name  Route  Sig  Dispense  Refill  . metroNIDAZOLE (FLAGYL) 500 MG tablet   Oral   Take 1 tablet (500 mg total) by mouth 2 (two) times daily.   14 tablet   0    BP 118/80  Pulse 92  Temp(Src) 98.4 F (36.9 C) (Oral)  Resp 20  SpO2 99%  LMP 09/06/2013 Physical Exam  Nursing note and vitals reviewed. Constitutional: She is oriented  to person, place, and time. She appears well-developed and well-nourished. No distress.  HENT:  Head: Normocephalic and atraumatic.  Neck: Normal range of motion. Neck supple.  Genitourinary:  There are 2 small vesicular lesions noted to the left labia majora and inferior to the vagina. These are tender to the touch.  Neurological: She is alert and oriented to person, place, and time.  Skin: Skin is warm and dry. She is not diaphoretic.    ED Course  Procedures (including critical care time) Labs Review Labs Reviewed - No data to display Imaging Review No results found.  MDM  No diagnosis found. These appear to be vesicular lesions that I am suspicious are herpetic.  A viral culture has been obtained and I believe the appropriate course of action is to start her on acyclovir before this worsens. She was recently tested for gonorrhea and Chlamydia, both knees were negative. The symptoms she was having at that time have resolved and I do not believe retesting her for these is indicated.    Geoffery Lyons, MD 10/01/13 1029

## 2013-10-06 ENCOUNTER — Telehealth (HOSPITAL_COMMUNITY): Payer: Self-pay | Admitting: Emergency Medicine

## 2013-10-06 LAB — VIRAL CULTURE VIRC: Special Requests: NORMAL

## 2013-10-07 ENCOUNTER — Telehealth (HOSPITAL_COMMUNITY): Payer: Self-pay | Admitting: Emergency Medicine

## 2013-10-07 NOTE — ED Notes (Signed)
Pt calling for results of Viral cx obtained 10/8.  ID verified x 2.  Pt informed no viral organism's isolated.

## 2013-10-30 ENCOUNTER — Other Ambulatory Visit: Payer: Self-pay

## 2014-05-07 ENCOUNTER — Encounter (HOSPITAL_COMMUNITY): Payer: Self-pay | Admitting: Emergency Medicine

## 2014-05-07 ENCOUNTER — Inpatient Hospital Stay (HOSPITAL_COMMUNITY)
Admission: EM | Admit: 2014-05-07 | Discharge: 2014-05-08 | Disposition: A | Discharge status: Other Acute Inpatient Hospital | Payer: Medicaid Other | Attending: Obstetrics and Gynecology | Admitting: Obstetrics and Gynecology

## 2014-05-07 DIAGNOSIS — O2390 Unspecified genitourinary tract infection in pregnancy, unspecified trimester: Secondary | ICD-10-CM | POA: Insufficient documentation

## 2014-05-07 DIAGNOSIS — O99330 Smoking (tobacco) complicating pregnancy, unspecified trimester: Secondary | ICD-10-CM | POA: Insufficient documentation

## 2014-05-07 DIAGNOSIS — Z8659 Personal history of other mental and behavioral disorders: Secondary | ICD-10-CM | POA: Insufficient documentation

## 2014-05-07 DIAGNOSIS — F1410 Cocaine abuse, uncomplicated: Secondary | ICD-10-CM | POA: Insufficient documentation

## 2014-05-07 DIAGNOSIS — Z792 Long term (current) use of antibiotics: Secondary | ICD-10-CM | POA: Insufficient documentation

## 2014-05-07 DIAGNOSIS — F1510 Other stimulant abuse, uncomplicated: Secondary | ICD-10-CM | POA: Insufficient documentation

## 2014-05-07 DIAGNOSIS — R1084 Generalized abdominal pain: Secondary | ICD-10-CM

## 2014-05-07 DIAGNOSIS — O99340 Other mental disorders complicating pregnancy, unspecified trimester: Secondary | ICD-10-CM | POA: Insufficient documentation

## 2014-05-07 DIAGNOSIS — E871 Hypo-osmolality and hyponatremia: Secondary | ICD-10-CM | POA: Insufficient documentation

## 2014-05-07 DIAGNOSIS — A599 Trichomoniasis, unspecified: Secondary | ICD-10-CM

## 2014-05-07 DIAGNOSIS — F1110 Opioid abuse, uncomplicated: Secondary | ICD-10-CM | POA: Insufficient documentation

## 2014-05-07 DIAGNOSIS — F1210 Cannabis abuse, uncomplicated: Secondary | ICD-10-CM | POA: Insufficient documentation

## 2014-05-07 DIAGNOSIS — K649 Unspecified hemorrhoids: Secondary | ICD-10-CM

## 2014-05-07 DIAGNOSIS — R109 Unspecified abdominal pain: Secondary | ICD-10-CM

## 2014-05-07 DIAGNOSIS — Z79899 Other long term (current) drug therapy: Secondary | ICD-10-CM | POA: Insufficient documentation

## 2014-05-07 DIAGNOSIS — E860 Dehydration: Secondary | ICD-10-CM | POA: Insufficient documentation

## 2014-05-07 DIAGNOSIS — O4700 False labor before 37 completed weeks of gestation, unspecified trimester: Secondary | ICD-10-CM | POA: Insufficient documentation

## 2014-05-07 DIAGNOSIS — N390 Urinary tract infection, site not specified: Secondary | ICD-10-CM | POA: Insufficient documentation

## 2014-05-07 DIAGNOSIS — Z872 Personal history of diseases of the skin and subcutaneous tissue: Secondary | ICD-10-CM | POA: Insufficient documentation

## 2014-05-07 LAB — CBC WITH DIFFERENTIAL/PLATELET
BASOS ABS: 0 10*3/uL (ref 0.0–0.1)
BASOS PCT: 0 % (ref 0–1)
Eosinophils Absolute: 0 10*3/uL (ref 0.0–0.7)
Eosinophils Relative: 0 % (ref 0–5)
HCT: 30.4 % — ABNORMAL LOW (ref 36.0–46.0)
Hemoglobin: 10.4 g/dL — ABNORMAL LOW (ref 12.0–15.0)
LYMPHS PCT: 14 % (ref 12–46)
Lymphs Abs: 1.7 10*3/uL (ref 0.7–4.0)
MCH: 26.9 pg (ref 26.0–34.0)
MCHC: 34.2 g/dL (ref 30.0–36.0)
MCV: 78.8 fL (ref 78.0–100.0)
MONO ABS: 1.1 10*3/uL — AB (ref 0.1–1.0)
Monocytes Relative: 9 % (ref 3–12)
NEUTROS ABS: 9.5 10*3/uL — AB (ref 1.7–7.7)
NEUTROS PCT: 77 % (ref 43–77)
Platelets: 370 10*3/uL (ref 150–400)
RBC: 3.86 MIL/uL — ABNORMAL LOW (ref 3.87–5.11)
RDW: 13.2 % (ref 11.5–15.5)
WBC: 12.3 10*3/uL — ABNORMAL HIGH (ref 4.0–10.5)

## 2014-05-07 LAB — COMPREHENSIVE METABOLIC PANEL
ALBUMIN: 2 g/dL — AB (ref 3.5–5.2)
ALK PHOS: 138 U/L — AB (ref 39–117)
ALT: 8 U/L (ref 0–35)
AST: 17 U/L (ref 0–37)
BUN: 9 mg/dL (ref 6–23)
CO2: 23 meq/L (ref 19–32)
CREATININE: 0.51 mg/dL (ref 0.50–1.10)
Calcium: 8.6 mg/dL (ref 8.4–10.5)
Chloride: 95 mEq/L — ABNORMAL LOW (ref 96–112)
GFR calc Af Amer: >90 mL/min (ref >90)
Glucose, Bld: 95 mg/dL (ref 70–99)
POTASSIUM: 3.6 meq/L — AB (ref 3.7–5.3)
SODIUM: 129 meq/L — AB (ref 137–147)
Total Bilirubin: 0.3 mg/dL (ref 0.3–1.2)
Total Protein: 6.2 g/dL (ref 6.0–8.3)

## 2014-05-07 LAB — URINE MICROSCOPIC-ADD ON

## 2014-05-07 LAB — URINALYSIS, ROUTINE W REFLEX MICROSCOPIC
GLUCOSE, UA: NEGATIVE mg/dL
Hgb urine dipstick: NEGATIVE
KETONES UR: 15 mg/dL — AB
NITRITE: POSITIVE — AB
PH: 5.5 (ref 5.0–8.0)
Protein, ur: 30 mg/dL — AB
SPECIFIC GRAVITY, URINE: 1.029 (ref 1.005–1.030)
Urobilinogen, UA: 2 mg/dL — ABNORMAL HIGH (ref 0.0–1.0)

## 2014-05-07 LAB — TYPE AND SCREEN
ABO/RH(D): O POS
Antibody Screen: NEGATIVE

## 2014-05-07 LAB — RAPID URINE DRUG SCREEN, HOSP PERFORMED
Amphetamines: POSITIVE — AB
BARBITURATES: NOT DETECTED
BENZODIAZEPINES: NOT DETECTED
COCAINE: POSITIVE — AB
OPIATES: POSITIVE — AB
Tetrahydrocannabinol: POSITIVE — AB

## 2014-05-07 LAB — TSH: TSH: 0.739 u[IU]/mL (ref 0.350–4.500)

## 2014-05-07 LAB — POC URINE PREG, ED: Preg Test, Ur: POSITIVE — AB

## 2014-05-07 LAB — ABO/RH: ABO/RH(D): O POS

## 2014-05-07 MED ORDER — SODIUM CHLORIDE 0.9 % IV BOLUS (SEPSIS)
1000.0000 mL | Freq: Once | INTRAVENOUS | Status: AC
  Administered 2014-05-07: 1000 mL via INTRAVENOUS

## 2014-05-07 MED ORDER — METRONIDAZOLE 500 MG PO TABS
2000.0000 mg | ORAL_TABLET | Freq: Once | ORAL | Status: AC
  Administered 2014-05-07: 2000 mg via ORAL
  Filled 2014-05-07: qty 4

## 2014-05-07 MED ORDER — CEFTRIAXONE SODIUM 1 G IJ SOLR
1.0000 g | Freq: Once | INTRAMUSCULAR | Status: AC
  Administered 2014-05-07: 1 g via INTRAVENOUS
  Filled 2014-05-07: qty 10

## 2014-05-07 MED ORDER — CEPHALEXIN 500 MG PO CAPS: 500.0000 mg | ORAL_CAPSULE | Freq: Four times a day (QID) | ORAL | Status: DC

## 2014-05-07 NOTE — MAU Note (Signed)
Received call from lab that previous nurse did not label FFN or GC/Chlamydia. Had to throw out. F Cresenzo Dishmon CNM notified.

## 2014-05-07 NOTE — ED Notes (Signed)
Rapid Response Nurse, Denny PeonErin, called and aware of pt and will be here shortly.

## 2014-05-07 NOTE — ED Notes (Signed)
Patient crying and asking for meal. Discussed with Dr. Gwendolyn GrantWalden.  He allows patient to eat at this time. Meal given to patient.

## 2014-05-07 NOTE — ED Notes (Signed)
Pt denies any prenatal care- reports she estimates she is about [redacted] weeks along.  Pt admits to vaginal discharge yellow in color for the past week, also admits to pain with urination.  Pt admits to drug use, reports heroin, crystal meth, cocaine, and marijuana within the last week- pt went to Methadone clinic this AM and received dose of Methadone.  Reports she has been feeling depressed because her boyfriend died- states that is why she started using drugs again.

## 2014-05-07 NOTE — Discharge Instructions (Signed)
Trichomoniasis °Trichomoniasis is an infection, caused by the Trichomonas organism, that affects both women and men. In women, the outer female genitalia and the vagina are affected. In men, the penis is mainly affected, but the prostate and other reproductive organs can also be involved. Trichomoniasis is a sexually transmitted disease (STD) and is most often passed to another person through sexual contact. The majority of people who get trichomoniasis do so from a sexual encounter and are also at risk for other STDs. °CAUSES  °· Sexual intercourse with an infected partner. °· It can be present in swimming pools or hot tubs. °SYMPTOMS  °· Abnormal gray-green frothy vaginal discharge in women. °· Vaginal itching and irritation in women. °· Itching and irritation of the area outside the vagina in women. °· Penile discharge with or without pain in males. °· Inflammation of the urethra (urethritis), causing painful urination. °· Bleeding after sexual intercourse. °RELATED COMPLICATIONS °· Pelvic inflammatory disease. °· Infection of the uterus (endometritis). °· Infertility. °· Tubal (ectopic) pregnancy. °· It can be associated with other STDs, including gonorrhea and chlamydia, hepatitis B, and HIV. °COMPLICATIONS DURING PREGNANCY °· Early (premature) delivery. °· Premature rupture of the membranes (PROM). °· Low birth weight. °DIAGNOSIS  °· Visualization of Trichomonas under the microscope from the vagina discharge. °· Ph of the vagina greater than 4.5, tested with a test tape. °· Trich Rapid Test. °· Culture of the organism, but this is not usually needed. °· It may be found on a Pap test. °· Having a "strawberry cervix,"which means the cervix looks very red like a strawberry. °TREATMENT  °· You may be given medication to fight the infection. Inform your caregiver if you could be or are pregnant. Some medications used to treat the infection should not be taken during pregnancy. °· Over-the-counter medications or  creams to decrease itching or irritation may be recommended. °· Your sexual partner will need to be treated if infected. °HOME CARE INSTRUCTIONS  °· Take all medication prescribed by your caregiver. °· Take over-the-counter medication for itching or irritation as directed by your caregiver. °· Do not have sexual intercourse while you have the infection. °· Do not douche or wear tampons. °· Discuss your infection with your partner, as your partner may have acquired the infection from you. Or, your partner may have been the person who transmitted the infection to you. °· Have your sex partner examined and treated if necessary. °· Practice safe, informed, and protected sex. °· See your caregiver for other STD testing. °SEEK MEDICAL CARE IF:  °· You still have symptoms after you finish the medication. °· You have an oral temperature above 102° F (38.9° C). °· You develop belly (abdominal) pain. °· You have pain when you urinate. °· You have bleeding after sexual intercourse. °· You develop a rash. °· The medication makes you sick or makes you throw up (vomit). °Document Released: 06/06/2001 Document Revised: 03/04/2012 Document Reviewed: 07/02/2009 °[employer]® Patient Information ©2014 [employer]. ° °
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
## 2014-05-07 NOTE — ED Notes (Signed)
Discussed with the patient that current plan of care is to be transferred to [facility].

## 2014-05-07 NOTE — ED Notes (Signed)
Pt sts "im having one now", placed hand on abd around baby in multiple locations, abd was not hard, did not feel like she was having a contration.

## 2014-05-07 NOTE — ED Notes (Signed)
RPR and GC/Chlamydia Probe Amp will not be collected here since patient is being transferred.

## 2014-05-07 NOTE — ED Notes (Signed)
Pt called for triage, no answer

## 2014-05-07 NOTE — MAU Provider Note (Signed)
None     Chief Complaint:  Abdominal Pain, Hemorrhoids and Contractions   Briana Zhang is  33 y.o. [phone] at 4247w4d presents complaining of Abdominal Pain, Hemorrhoids and Contractions . Pt has not had any PNC, EDC based on uncertain LMP. She states occasional contractions are associated with none vaginal bleeding, intact membranes, along with active fetal movement. Pt was transferred from [facility] to [facility].  She was dx there with a UTI, given IV rocephin. Pt noted to be having occ ctx on EFM and sent here for eval.  She c/o abd pain asso with ctx, green vaginal discharge, and hemorrhoids.  Has applied for medicaid, awaiting her card.  Admits to drug use. Started going to the methadone clinic this morning. Please see ED provider's notes for complete details.   Obstetrical/Gynecological History: OB History   Grav Para Term Preterm Abortions TAB SAB Ect Mult Living   4 2 2  0 1 1    2      Past Medical History: Past Medical History  Diagnosis Date  . Bipolar 1 disorder   . Paranoid schizophrenia   . Cellulitis and abscess   . Substance abuse     Past Surgical History: Past Surgical History  Procedure Laterality Date  . Cesarean section      Family History: History reviewed. No pertinent family history.  Social History: History  Substance Use Topics  . Smoking status: Current Every Day Smoker -- 0.50 packs/day    Types: Cigarettes  . Smokeless tobacco: Never Used  . Alcohol Use: No    Allergies:  Allergies  Allergen Reactions  . Ciprofloxacin Swelling and Rash    Rashes "breaks her out"    Meds:  No prescriptions prior to admission    Review of Systems   Constitutional: Negative for fever and chills Eyes: Negative for visual disturbances Respiratory: Negative for shortness of breath, dyspnea Cardiovascular: Negative for chest pain or palpitations  Gastrointestinal: Negative for vomiting, diarrhea and constipation Genitourinary: Positive for dysuria and  urgency Musculoskeletal: Negative for back pain, joint pain, myalgias  Neurological: Negative for dizziness and headaches     Physical Exam  Blood pressure 98/58, pulse 78, temperature 97.8 F (36.6 C), temperature source Oral, resp. rate 16, height 5\' 1"  (1.549 m), weight 48.988 kg (108 lb), last menstrual period 09/06/2013, SpO2 97.00%. GENERAL: Well-developed, poorly nourished female in no acute distress.  LUNGS: Clear to auscultation bilaterally.  HEART: Regular rate and rhythm. ABDOMEN: Soft, nontender, nondistended, gravid. Fundal height u+4 EXTREMITIES: Nontender, no edema, 2+ distal pulses. DTR's 2+ CERVICAL EXAM: Dilatation 0cm   Effacement 0%   Station -3   Presentation: probably cephalic FHT:  Baseline rate 150 bpm   Variability moderate  Accelerations present   Decelerations none Contractions: occ now Rectum:  Small, non-thrombosed hemorrhoid   Labs: Results for orders placed during the hospital encounter of 05/07/14 (from the past 24 hour(s))  URINALYSIS, ROUTINE W REFLEX MICROSCOPIC   Collection Time    05/07/14  6:55 PM      Result Value Ref Range   Color, Urine AMBER (*) YELLOW   APPearance HAZY (*) CLEAR   Specific Gravity, Urine 1.029  1.005 - 1.030   pH 5.5  5.0 - 8.0   Glucose, UA NEGATIVE  NEGATIVE mg/dL   Hgb urine dipstick NEGATIVE  NEGATIVE   Bilirubin Urine MODERATE (*) NEGATIVE   Ketones, ur 15 (*) NEGATIVE mg/dL   Protein, ur 30 (*) NEGATIVE mg/dL   Urobilinogen, UA  2.0 (*) 0.0 - 1.0 mg/dL   Nitrite POSITIVE (*) NEGATIVE   Leukocytes, UA MODERATE (*) NEGATIVE  URINE RAPID DRUG SCREEN (HOSP PERFORMED)   Collection Time    05/07/14  6:55 PM      Result Value Ref Range   Opiates POSITIVE (*) NONE DETECTED   Cocaine POSITIVE (*) NONE DETECTED   Benzodiazepines NONE DETECTED  NONE DETECTED   Amphetamines POSITIVE (*) NONE DETECTED   Tetrahydrocannabinol POSITIVE (*) NONE DETECTED   Barbiturates NONE DETECTED  NONE DETECTED  URINE MICROSCOPIC-ADD  ON   Collection Time    05/07/14  6:55 PM      Result Value Ref Range   Squamous Epithelial / LPF FEW (*) RARE   WBC, UA 11-20  <3 WBC/hpf   RBC / HPF 0-2  <3 RBC/hpf   Bacteria, UA FEW (*) RARE   Urine-Other MUCOUS PRESENT    POC URINE PREG, ED   Collection Time    05/07/14  7:02 PM      Result Value Ref Range   Preg Test, Ur POSITIVE (*) NEGATIVE  CBC WITH DIFFERENTIAL   Collection Time    05/07/14  8:25 PM      Result Value Ref Range   WBC 12.3 (*) 4.0 - 10.5 K/uL   RBC 3.86 (*) 3.87 - 5.11 MIL/uL   Hemoglobin 10.4 (*) 12.0 - 15.0 g/dL   HCT 16.130.4 (*) 09.636.0 - 04.546.0 %   MCV 78.8  78.0 - 100.0 fL   MCH 26.9  26.0 - 34.0 pg   MCHC 34.2  30.0 - 36.0 g/dL   RDW 40.913.2  81.111.5 - 91.415.5 %   Platelets 370  150 - 400 K/uL   Neutrophils Relative % 77  43 - 77 %   Neutro Abs 9.5 (*) 1.7 - 7.7 K/uL   Lymphocytes Relative 14  12 - 46 %   Lymphs Abs 1.7  0.7 - 4.0 K/uL   Monocytes Relative 9  3 - 12 %   Monocytes Absolute 1.1 (*) 0.1 - 1.0 K/uL   Eosinophils Relative 0  0 - 5 %   Eosinophils Absolute 0.0  0.0 - 0.7 K/uL   Basophils Relative 0  0 - 1 %   Basophils Absolute 0.0  0.0 - 0.1 K/uL  COMPREHENSIVE METABOLIC PANEL   Collection Time    05/07/14  8:25 PM      Result Value Ref Range   Sodium 129 (*) 137 - 147 mEq/L   Potassium 3.6 (*) 3.7 - 5.3 mEq/L   Chloride 95 (*) 96 - 112 mEq/L   CO2 23  19 - 32 mEq/L   Glucose, Bld 95  70 - 99 mg/dL   BUN 9  6 - 23 mg/dL   Creatinine, Ser 7.820.51  0.50 - 1.10 mg/dL   Calcium 8.6  8.4 - 95.610.5 mg/dL   Total Protein 6.2  6.0 - 8.3 g/dL   Albumin 2.0 (*) 3.5 - 5.2 g/dL   AST 17  0 - 37 U/L   ALT 8  0 - 35 U/L   Alkaline Phosphatase 138 (*) 39 - 117 U/L   Total Bilirubin 0.3  0.3 - 1.2 mg/dL   GFR calc non Af Amer >90  >90 mL/min   GFR calc Af Amer >90  >90 mL/min  TSH   Collection Time    05/07/14  8:25 PM      Result Value Ref Range   TSH 0.739  0.350 - 4.500  uIU/mL  TYPE AND SCREEN   Collection Time    05/07/14  9:19 PM      Result  Value Ref Range   ABO/RH(D) O POS     Antibody Screen NEG     Sample Expiration 05/10/2014    ABO/RH   Collection Time    05/07/14  9:19 PM      Result Value Ref Range   ABO/RH(D) O POS     Urine showed Trich Fetal fibronectin collected, but refused by lab b/c it wasn't labeled.     Assessment: Briana Zhang is  33 y.o. [phone] at [redacted]w[redacted]d presents with :  Plan: -UTI:  Treated with IV rocephin and rx for Keflex (cheaper than macrobid) -Trichimoniasis:  Treated in MAU with PO Flagyl -Pt already has been in contact with [facility] for [facility]; states will f/u.   Jacklyn Shell 5/14/201511:04 PM

## 2014-05-07 NOTE — ED Notes (Signed)
OB rapid response at bedside with baby monitor.

## 2014-05-07 NOTE — ED Notes (Signed)
Multiple atttempts by phlebotomy and RN to draw blood.

## 2014-05-07 NOTE — ED Notes (Addendum)
Pt c/o possible contractions, sts she thinks she might be having one every hour, sts she is [redacted] weeks pregnant. sts she is also concerned about her hemorrhoids that started a couple of days ago. Reports she feels like she has to urinate all the time and thinks she has a UTI, denies dysuria/blood in urine. Denies gush of fluid from vagina/water breaking. sts she has been having vaginal d/c that is creamy/green color, foul odor, sts she is still sexually active and doesn't use protection, sts she thinks she could have an STD. Nad, skin warm and dry, resp e/u.

## 2014-05-07 NOTE — Progress Notes (Signed)
1848  Arrived to evaluate this 32yo G4P2 @ approximately 29.[redacted] wks GA according to one health dept visit with pregnancy test.  Patient is an admitted heroin, cocaine, marijuana, and crystal meth user with an extensive mental health history.  Today she has multiple complaints, including:  Abdominal pain, green vaginal discharge, constipation, hemorrhoids, and desire for substance abuse treatment.  She is having irregular contractions.  Frequent fetal movement palpated.  No vaginal bleeding or leaking of fluid.  1930  Medical screening exam/ labs/ SSE in progress.  OBRR to call FP for consult.

## 2014-05-07 NOTE — ED Provider Notes (Signed)
CSN: [id number]     Arrival date & time 05/07/14  1754 History   First MD Initiated Contact with Patient 05/07/14 1838     Chief Complaint  Patient presents with  . Abdominal Pain  . Hemorrhoids  . Contractions     (Consider location/radiation/quality/duration/timing/severity/associated sxs/prior Treatment) Patient is a 33 y.o. female presenting with abdominal pain. The history is provided by the patient.  Abdominal Pain Pain location:  Suprapubic (And back) Pain quality: cramping, dull and sharp   Pain radiates to:  Does not radiate Pain severity:  Moderate Onset quality:  Gradual Timing:  Constant Progression:  Worsening Chronicity:  New Context: recent sexual activity   Context: not alcohol use, not retching and not trauma   Relieved by:  Nothing Worsened by:  Nothing tried Ineffective treatments:  None tried Associated symptoms: dysuria and vaginal discharge (Copious)   Associated symptoms: no diarrhea, no fever, no hematemesis, no hematuria, no shortness of breath and no vomiting   Risk factors: pregnancy   Risk factors comment:  Polysubstance abuse   Past Medical History  Diagnosis Date  . Bipolar 1 disorder   . Paranoid schizophrenia   . Cellulitis and abscess   . Substance abuse    Past Surgical History  Procedure Laterality Date  . Cesarean section     No family history on file. History  Substance Use Topics  . Smoking status: Current Every Day Smoker -- 0.50 packs/day    Types: Cigarettes  . Smokeless tobacco: Never Used  . Alcohol Use: No   OB History   Grav Para Term Preterm Abortions TAB SAB Ect Mult Living   1              Review of Systems  Constitutional: Negative for fever.  Respiratory: Negative for shortness of breath.   Gastrointestinal: Positive for abdominal pain. Negative for vomiting, diarrhea and hematemesis.  Genitourinary: Positive for dysuria and vaginal discharge (Copious). Negative for hematuria.  All other systems reviewed  and are negative.     Allergies  Ciprofloxacin  Home Medications   Prior to Admission medications   Medication Sig Start Date End Date Taking? Authorizing Provider  acyclovir (ZOVIRAX) 400 MG tablet Take 1 tablet (400 mg total) by mouth 5 (five) times daily. 10/01/13   Geoffery Lyonsouglas Delo, MD  metroNIDAZOLE (FLAGYL) 500 MG tablet Take 1 tablet (500 mg total) by mouth 2 (two) times daily. 09/22/13   Teressa LowerVrinda Pickering, NP   BP 91/57  Pulse 92  Temp(Src) 98 F (36.7 C) (Oral)  Resp 18  Ht 5\' 1"  (1.549 m)  Wt 108 lb (48.988 kg)  BMI 20.42 kg/m2  SpO2 100%  LMP 09/06/2013 Physical Exam  Constitutional: She is oriented to person, place, and time. She appears well-developed and well-nourished. No distress.  HENT:  Head: Normocephalic.  Eyes: Conjunctivae are normal.  Neck: Neck supple. No tracheal deviation present.  Cardiovascular: Normal rate, regular rhythm and normal heart sounds.   Pulmonary/Chest: Effort normal. No respiratory distress. She has no wheezes.  Abdominal: Soft. Distention: Gravid abdomen with tenderness at apex of uterus. Positive fetal movement. There is tenderness (Suprapubic).  Neurological: She is alert and oriented to person, place, and time. No cranial nerve deficit. Coordination normal.  Skin: Skin is warm and dry.  Psychiatric: She has a normal mood and affect.    ED Course  Procedures (including critical care time) Labs Review Labs Reviewed  URINALYSIS, ROUTINE W REFLEX MICROSCOPIC - Abnormal; Notable for the following:  Color, Urine AMBER (*)    APPearance HAZY (*)    Bilirubin Urine MODERATE (*)    Ketones, ur 15 (*)    Protein, ur 30 (*)    Urobilinogen, UA 2.0 (*)    Nitrite POSITIVE (*)    Leukocytes, UA MODERATE (*)    All other components within normal limits  URINE RAPID DRUG SCREEN (HOSP PERFORMED) - Abnormal; Notable for the following:    Opiates POSITIVE (*)    Cocaine POSITIVE (*)    Amphetamines POSITIVE (*)    Tetrahydrocannabinol  POSITIVE (*)    All other components within normal limits  CBC WITH DIFFERENTIAL - Abnormal; Notable for the following:    WBC 12.3 (*)    RBC 3.86 (*)    Hemoglobin 10.4 (*)    HCT 30.4 (*)    Neutro Abs 9.5 (*)    Monocytes Absolute 1.1 (*)    All other components within normal limits  COMPREHENSIVE METABOLIC PANEL - Abnormal; Notable for the following:    Sodium 129 (*)    Potassium 3.6 (*)    Chloride 95 (*)    Albumin 2.0 (*)    Alkaline Phosphatase 138 (*)    All other components within normal limits  URINE MICROSCOPIC-ADD ON - Abnormal; Notable for the following:    Squamous Epithelial / LPF FEW (*)    Bacteria, UA FEW (*)    All other components within normal limits  POC URINE PREG, ED - Abnormal; Notable for the following:    Preg Test, Ur POSITIVE (*)    All other components within normal limits  URINE CULTURE  TSH  RPR  HIV ANTIBODY (ROUTINE TESTING)  RUBELLA SCREEN  HEPATITIS PANEL, ACUTE  TYPE AND SCREEN  ABO/RH    Imaging Review No results found.   EKG Interpretation None      MDM   Final diagnoses:  Abdominal pain  UTI (lower urinary tract infection)   33 y.o. female presents with no prenatal care and new onset cramping and lower bowel pain. She as at roughly 28 weeks, has been heavily abusing cocaine, marijuana, heroin, and amphetamines at home and this has worsened since her boyfriend and father of baby committed suicide in the last month. She has had multiple sexual partners without protection recently. She states she is concerned that she has a urinary tract infection currently and has noticed a feeling of contractions which cause her presentation today. OB nursing available at bedside for evaluation, the patient is having current contractions and does show evidence of a urinary tract infection so will be treated with Rocephin in the emergency department and be [facility] as her hemodynamic status is stable. She was provided a  bolus of IV fluid for clinically evident dehydration and mild hyponatremia. Transferred in stable [facility] for further workup and evaluation of leg presenting pregnancy that is very high risk.    Lyndal Pulleyaniel Lowry Bala, MD 05/07/14 [phone]

## 2014-05-08 LAB — HIV ANTIBODY (ROUTINE TESTING W REFLEX): HIV: NONREACTIVE

## 2014-05-08 LAB — HEPATITIS PANEL, ACUTE
HCV AB: REACTIVE — AB
HEP A IGM: NONREACTIVE
HEP B C IGM: NONREACTIVE
HEP B S AG: NEGATIVE

## 2014-05-08 LAB — RPR

## 2014-05-08 LAB — RUBELLA SCREEN: Rubella: 1.42 Index — ABNORMAL HIGH (ref <0.90)

## 2014-05-08 NOTE — MAU Provider Note (Signed)
Attestation of Attending Supervision of Advanced Practitioner (CNM/NP): Evaluation and management procedures were performed by the Advanced Practitioner under my supervision and collaboration.  I have reviewed the Advanced Practitioner's note and chart, and I agree with the management and plan.  [name] 05/08/2014 8:01 AM

## 2014-05-08 NOTE — ED Provider Notes (Signed)
I saw and evaluated the patient, reviewed the resident's note and I agree with the findings and plan.   EKG Interpretation None      Patient here with abdominal pain. [redacted] weeks pregnant. Has substance abuse problem. No vaginal bleeding, gush of fluid. Rapid Response OB nurses here at bedside - contractions noted. They spoke with Dr. Jolayne Pantheronstant, who [facility]. Patient has UTI, rocephin given. Transferred.   Dagmar HaitWilliam Francene Mcerlean, MD 05/08/14 [phone]

## 2014-05-09 LAB — URINE CULTURE
CULTURE: NO GROWTH
Colony Count: NO GROWTH

## 2014-05-19 ENCOUNTER — Encounter: Payer: Self-pay | Admitting: Obstetrics & Gynecology

## 2014-05-19 ENCOUNTER — Telehealth: Payer: Self-pay | Admitting: *Deleted

## 2014-05-19 NOTE — Telephone Encounter (Signed)
Called Briana Zhang at number she left [phone] and left a message I am returning her call. Please call clinic

## 2014-05-19 NOTE — Telephone Encounter (Signed)
Briana Zhang called and left a letter that she wants a nurse to fax a letter to her attorney that she has an appointment 05/27/14 and is her first OB appointment and is important that she keeps it.

## 2014-05-21 NOTE — Telephone Encounter (Signed)
Called patient stating I am returning her phone call. Patient stated that was okay and she has already got it taken care of. Patient had no further questions

## 2014-05-27 ENCOUNTER — Encounter: Payer: Self-pay | Admitting: Family Medicine

## 2014-05-27 ENCOUNTER — Other Ambulatory Visit (HOSPITAL_COMMUNITY)
Admission: RE | Admit: 2014-05-27 | Discharge: 2014-05-27 | Disposition: A | Discharge status: Home / Self Care | Payer: Medicaid Other | Source: Ambulatory Visit | Attending: Family Medicine | Admitting: Family Medicine

## 2014-05-27 ENCOUNTER — Ambulatory Visit (INDEPENDENT_AMBULATORY_CARE_PROVIDER_SITE_OTHER): Payer: Medicaid Other | Admitting: Family Medicine

## 2014-05-27 VITALS — BP 113/75 | HR 84 | Temp 97.1°F | Wt 104.8 lb

## 2014-05-27 DIAGNOSIS — F1120 Opioid dependence, uncomplicated: Secondary | ICD-10-CM

## 2014-05-27 DIAGNOSIS — O0973 Supervision of high risk pregnancy due to social problems, third trimester: Secondary | ICD-10-CM

## 2014-05-27 DIAGNOSIS — Z01419 Encounter for gynecological examination (general) (routine) without abnormal findings: Secondary | ICD-10-CM | POA: Insufficient documentation

## 2014-05-27 DIAGNOSIS — O0930 Supervision of pregnancy with insufficient antenatal care, unspecified trimester: Secondary | ICD-10-CM

## 2014-05-27 DIAGNOSIS — N898 Other specified noninflammatory disorders of vagina: Secondary | ICD-10-CM

## 2014-05-27 DIAGNOSIS — Z113 Encounter for screening for infections with a predominantly sexual mode of transmission: Secondary | ICD-10-CM | POA: Insufficient documentation

## 2014-05-27 DIAGNOSIS — O09899 Supervision of other high risk pregnancies, unspecified trimester: Secondary | ICD-10-CM

## 2014-05-27 DIAGNOSIS — Z1151 Encounter for screening for human papillomavirus (HPV): Secondary | ICD-10-CM | POA: Insufficient documentation

## 2014-05-27 DIAGNOSIS — Z23 Encounter for immunization: Secondary | ICD-10-CM

## 2014-05-27 HISTORY — DX: Supervision of high risk pregnancy due to social problems, third trimester: O09.73

## 2014-05-27 HISTORY — DX: Opioid dependence, uncomplicated: F11.20

## 2014-05-27 LAB — POCT URINALYSIS DIP (DEVICE)
Glucose, UA: NEGATIVE mg/dL
HGB URINE DIPSTICK: NEGATIVE
Ketones, ur: 15 mg/dL — AB
NITRITE: NEGATIVE
Protein, ur: 30 mg/dL — AB
Specific Gravity, Urine: 1.025 (ref 1.005–1.030)
UROBILINOGEN UA: 2 mg/dL — AB (ref 0.0–1.0)
pH: 6.5 (ref 5.0–8.0)

## 2014-05-27 MED ORDER — TETANUS-DIPHTH-ACELL PERTUSSIS 5-2.5-18.5 LF-MCG/0.5 IM SUSP: 0.5000 mL | Freq: Once | INTRAMUSCULAR | Status: DC

## 2014-05-27 NOTE — Progress Notes (Signed)
Initial prenatal visit with labs, attends Methadone clinic daily and has paper work to be signed

## 2014-05-27 NOTE — Patient Instructions (Signed)
Second Trimester of Pregnancy The second trimester is from week 13 through week 28, months 4 through 6. The second trimester is often a time when you feel your best. Your body has also adjusted to being pregnant, and you begin to feel better physically. Usually, morning sickness has lessened or quit completely, you may have more energy, and you may have an increase in appetite. The second trimester is also a time when the fetus is growing rapidly. At the end of the sixth month, the fetus is about 9 inches long and weighs about 1 pounds. You will likely begin to feel the baby move (quickening) between 18 and 20 weeks of the pregnancy. BODY CHANGES Your body goes through many changes during pregnancy. The changes vary from woman to woman.   Your weight will continue to increase. You will notice your lower abdomen bulging out.  You may begin to get stretch marks on your hips, abdomen, and breasts.  You may develop headaches that can be relieved by medicines approved by your caregiver.  You may urinate more often because the fetus is pressing on your bladder.  You may develop or continue to have heartburn as a result of your pregnancy.  You may develop constipation because certain hormones are causing the muscles that push waste through your intestines to slow down.  You may develop hemorrhoids or swollen, bulging veins (varicose veins).  You may have back pain because of the weight gain and pregnancy hormones relaxing your joints between the bones in your pelvis and as a result of a shift in weight and the muscles that support your balance.  Your breasts will continue to grow and be tender.  Your gums may bleed and may be sensitive to brushing and flossing.  Dark spots or blotches (chloasma, mask of pregnancy) may develop on your face. This will likely fade after the baby is born.  A dark line from your belly button to the pubic area (linea nigra) may appear. This will likely fade after the  baby is born. WHAT TO EXPECT AT YOUR PRENATAL VISITS During a routine prenatal visit:  You will be weighed to make sure you and the fetus are growing normally.  Your blood pressure will be taken.  Your abdomen will be measured to track your baby's growth.  The fetal heartbeat will be listened to.  Any test results from the previous visit will be discussed. Your caregiver may ask you:  How you are feeling.  If you are feeling the baby move.  If you have had any abnormal symptoms, such as leaking fluid, bleeding, severe headaches, or abdominal cramping.  If you have any questions. Other tests that may be performed during your second trimester include:  Blood tests that check for:  Low iron levels (anemia).  Gestational diabetes (between 24 and 28 weeks).  Rh antibodies.  Urine tests to check for infections, diabetes, or protein in the urine.  An ultrasound to confirm the proper growth and development of the baby.  An amniocentesis to check for possible genetic problems.  Fetal screens for spina bifida and Down syndrome. HOME CARE INSTRUCTIONS   Avoid all smoking, herbs, alcohol, and unprescribed drugs. These chemicals affect the formation and growth of the baby.  Follow your caregiver's instructions regarding medicine use. There are medicines that are either safe or unsafe to take during pregnancy.  Exercise only as directed by your caregiver. Experiencing uterine cramps is a good sign to stop exercising.  Continue to eat regular,   healthy meals.  Wear a good support bra for breast tenderness.  Do not use hot tubs, steam rooms, or saunas.  Wear your seat belt at all times when driving.  Avoid raw meat, uncooked cheese, cat litter boxes, and soil used by cats. These carry germs that can cause birth defects in the baby.  Take your prenatal vitamins.  Try taking a stool softener (if your caregiver approves) if you develop constipation. Eat more high-fiber foods,  such as fresh vegetables or fruit and whole grains. Drink plenty of fluids to keep your urine clear or pale yellow.  Take warm sitz baths to soothe any pain or discomfort caused by hemorrhoids. Use hemorrhoid cream if your caregiver approves.  If you develop varicose veins, wear support hose. Elevate your feet for 15 minutes, 3 4 times a day. Limit salt in your diet.  Avoid heavy lifting, wear low heel shoes, and practice good posture.  Rest with your legs elevated if you have leg cramps or low back pain.  Visit your dentist if you have not gone yet during your pregnancy. Use a soft toothbrush to brush your teeth and be gentle when you floss.  A sexual relationship may be continued unless your caregiver directs you otherwise.  Continue to go to all your prenatal visits as directed by your caregiver. SEEK MEDICAL CARE IF:   You have dizziness.  You have mild pelvic cramps, pelvic pressure, or nagging pain in the abdominal area.  You have persistent nausea, vomiting, or diarrhea.  You have a bad smelling vaginal discharge.  You have pain with urination. SEEK IMMEDIATE MEDICAL CARE IF:   You have a fever.  You are leaking fluid from your vagina.  You have spotting or bleeding from your vagina.  You have severe abdominal cramping or pain.  You have rapid weight gain or loss.  You have shortness of breath with chest pain.  You notice sudden or extreme swelling of your face, hands, ankles, feet, or legs.  You have not felt your baby move in over an hour.  You have severe headaches that do not go away with medicine.  You have vision changes. Document Released: 12/05/2001 Document Revised: 08/13/2013 Document Reviewed: 02/11/2013 [employer] Patient Information 2014 [employer].  

## 2014-05-27 NOTE — Progress Notes (Signed)
Subjective:    Briana Zhang is being seen today for her first obstetrical visit.  This is not a planned pregnancy. She is at 4676w3d gestation. Her obstetrical history is significant for heroin use during pregancny, on methadone, other children with parents. Relationship with FOB: Deceased. Patient Does intend to breast feed. Pregnancy history fully reviewed. Hx 2 prior c-sections - plans for repeat  Trich and UTI Dx at [facility]U, pt reports being tx and resolution of ctx since that time.  Menstrual History: OB History   Grav Para Term Preterm Abortions TAB SAB Ect Mult Living   4 2 2  0 1 1    2        Patient's last menstrual period was 09/06/2013.    The following portions of the patient's history were reviewed and updated as appropriate: allergies, current medications, past family history, past medical history, past social history, past surgical history and problem list.  Review of Systems +FM, no LOF, no VB, no CTX hx of trich resolved and rctx resolved Pertinent items are noted in HPI.    Objective:    BP 113/75  Pulse 84  Temp(Src) 97.1 F (36.2 C)  Wt 47.537 kg (104 lb 12.8 oz)  LMP 09/06/2013  General Appearance:    Alert, cooperative, no distress, appears stated age  Head:    Normocephalic, without obvious abnormality, atraumatic  Eyes:     conjunctiva/corneas clear, EOM's intact, fundi    benign, both eyes  Throat:   Lips, mucosa, and tongue normal; teeth and gums normal  Neck:   Supple, symmetrical, trachea midline, no adenopathy;    thyroid:  no enlargement/tenderness/nodules;   Back:     Symmetric, no curvature, ROM normal, no CVA tenderness  Lungs:     Clear to auscultation bilaterally, respirations unlabored  Chest Wall:    No tenderness or deformity   Heart:    Regular rate and rhythm, S1 and S2 normal, no murmur, rub   or gallop  Abdomen:     Soft, non-tender, bowel sounds active all four quadrants,    no masses, no organomegaly  Genitalia:    Normal female  without lesion, discharge or tenderness, pap smear permfored     Extremities:   Extremities normal, atraumatic, no cyanosis or edema  Pulses:   2+ and symmetric all extremities  Skin:   Skin color, texture, turgor normal, no rashes or lesions  Lymph nodes:   Cervical, supraclavicular, and axillary nodes normal  Neurologic:   CN grossly intact, normal strength, sensation and reflexes    throughout      Assessment:        Briana Zhang is a 33 y.o. [id number] at 9876w3d by unsure LMP but ~27 here for ROB visit.  Discussed with Patient:  - NICU referral for methadone withdrawal - Hep panel (hx of sharing needles) -Plans to breast feed.  All questions answered. -Continue prenatal vitamins. -Reviewed fetal kick counts (Pt to perform daily at a time when the baby is active, lie laterally with both hands on belly in quiet room and count all movements (hiccups, shoulder rolls, obvious kicks, etc); pt is to report to clinic or MAU for less than 10 movements felt in a one hour time period-pt told as soon as she counts 10 movements the count is complete.)  - Routine precautions discussed (depression, infection s/s).   Patient provided with all pertinent phone numbers for emergencies. - RTC for any VB, regular, painful cramps/ctxs occurring at a rate  of >2/10 min, fever (100.5 or higher), n/v/d, any pain that is unresolving or worsening, LOF, decreased fetal movement, CP, SOB, edema  Problems: Patient Active Problem List   Diagnosis Date Noted  . Supervision of high risk pregnancy due to social problems in third trimester 05/27/2014  . 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 Glucose tolerance test ordered.  Patient will draw in clinic.  Will f/u test and amend plan based on results. 2. CBC and antibody screen ordered. 3. Prenatal labs 4. Hepatitis Panel 5. Drug screening 6.PAP today  [ ]  Vaccines: Flu:dec  Tdap: today [x ] BCM: mirena  Edu: [x ] PTL precautions; [ ]  BF class; [ ]   childbirth class; [ ]   BF counseling;  Initial labs drawn.  Prenatal vitamins. Problem list reviewed and updated. Role of ultrasound in pregnancy discussed; fetal survey: ordered. Follow up in 2 weeks. 60% of 45 min visit spent on counseling and coordination of care.

## 2014-05-27 NOTE — Progress Notes (Signed)
Ultrasound scheduled for 06/01/14 at 1400. Called Heather in NICU to coordinate NICU NAS Consult-- left message with patient information and contact information. Informed patient she will be contacted with information. Authorization to [employer] from [employer] at [place] for Pregnancy sheet filled out and signed with patient.

## 2014-05-28 ENCOUNTER — Encounter (HOSPITAL_COMMUNITY): Payer: Self-pay

## 2014-05-28 LAB — WET PREP, GENITAL
Clue Cells Wet Prep HPF POC: NONE SEEN
TRICH WET PREP: NONE SEEN
YEAST WET PREP: NONE SEEN

## 2014-05-28 LAB — OBSTETRIC PANEL
Antibody Screen: NEGATIVE
BASOS ABS: 0 10*3/uL (ref 0.0–0.1)
Basophils Relative: 0 % (ref 0–1)
Eosinophils Absolute: 0 10*3/uL (ref 0.0–0.7)
Eosinophils Relative: 0 % (ref 0–5)
HEMATOCRIT: 34.8 % — AB (ref 36.0–46.0)
HEP B S AG: NEGATIVE
Hemoglobin: 11.8 g/dL — ABNORMAL LOW (ref 12.0–15.0)
LYMPHS ABS: 2.6 10*3/uL (ref 0.7–4.0)
Lymphocytes Relative: 28 % (ref 12–46)
MCH: 26.6 pg (ref 26.0–34.0)
MCHC: 33.9 g/dL (ref 30.0–36.0)
MCV: 78.6 fL (ref 78.0–100.0)
Monocytes Absolute: 0.7 10*3/uL (ref 0.1–1.0)
Monocytes Relative: 7 % (ref 3–12)
NEUTROS ABS: 6 10*3/uL (ref 1.7–7.7)
Neutrophils Relative %: 65 % (ref 43–77)
PLATELETS: 317 10*3/uL (ref 150–400)
RBC: 4.43 MIL/uL (ref 3.87–5.11)
RDW: 14.5 % (ref 11.5–15.5)
Rh Type: POSITIVE
Rubella: 1.54 Index — ABNORMAL HIGH (ref <0.90)
WBC: 9.3 10*3/uL (ref 4.0–10.5)

## 2014-05-28 LAB — GLUCOSE TOLERANCE, 1 HOUR (50G) W/O FASTING: Glucose, 1 Hour GTT: 90 mg/dL (ref 70–140)

## 2014-05-28 LAB — GC/CHLAMYDIA PROBE AMP
CT Probe RNA: NEGATIVE
GC PROBE AMP APTIMA: NEGATIVE

## 2014-05-28 LAB — HIV ANTIBODY (ROUTINE TESTING W REFLEX): HIV 1&2 Ab, 4th Generation: NONREACTIVE

## 2014-05-28 NOTE — Progress Notes (Signed)
Attempted to call this patient to set up a NICU NAS tour. Both phone numbers listed are disconnected. Feel free to call [phone] to schedule this tour at a later time when the patient comes in for an appointment. Thank you.

## 2014-05-31 LAB — CULTURE, OB URINE

## 2014-06-01 ENCOUNTER — Ambulatory Visit (HOSPITAL_COMMUNITY): Admission: RE | Admit: 2014-06-01 | Payer: Medicaid Other | Source: Ambulatory Visit

## 2014-06-01 LAB — CYTOLOGY - PAP

## 2014-06-02 LAB — OPIATES/OPIOIDS (LC/MS-MS)
Codeine Urine: 6493 ng/mL — AB (ref <50)
Hydrocodone: NEGATIVE ng/mL (ref <50)
Hydromorphone: 1028 ng/mL — AB (ref <50)
Morphine Urine: >75000 ng/mL — AB (ref <50)
NORHYDROCODONE, UR: NEGATIVE ng/mL (ref <50)
NOROXYCODONE, UR: NEGATIVE ng/mL (ref <50)
Oxycodone, ur: NEGATIVE ng/mL (ref <50)
Oxymorphone: NEGATIVE ng/mL (ref <50)

## 2014-06-02 LAB — AMPHETAMINES (GC/LC/MS), URINE
AMPHETAMINE CONF, UR: NEGATIVE ng/mL (ref <250)
Methamphetamine Quant, Ur: NEGATIVE ng/mL (ref <250)

## 2014-06-02 LAB — MEPERIDINE (GC/LC/MS), URINE
MEPERIDINE UR CONFIRM: NEGATIVE ng/mL (ref <100)
Normeperidine (GC/LC/MS), ur confirm: NEGATIVE ng/mL (ref <100)

## 2014-06-02 LAB — METHADONE (GC/LC/MS), URINE
EDDP (GC/LC/MS), ur confirm: 22967 ng/mL — AB (ref <100)
Methadone (GC/LC/MS), ur confirm: 4048 ng/mL — AB (ref <100)

## 2014-06-02 LAB — CANNABANOIDS (GC/LC/MS), URINE: THC-COOH (GC/LC/MS), ur confirm: 183 ng/mL — AB (ref <5)

## 2014-06-02 LAB — OXYCODONE, URINE (LC/MS-MS)
Noroxycodone, Ur: NEGATIVE ng/mL (ref <50)
OXYCODONE, UR: NEGATIVE ng/mL (ref <50)
OXYMORPHONE, URINE: NEGATIVE ng/mL (ref <50)

## 2014-06-02 LAB — MDMA (ECSTASY), URINE
MDA GC/MS confirm: NEGATIVE ng/mL (ref <200)
MDMA GC/MS CONF: NEGATIVE ng/mL (ref <200)

## 2014-06-02 LAB — COCAINE METABOLITE (GC/LC/MS), URINE: Benzoylecgonine GC/MS Conf: >75000 ng/mL — AB (ref <100)

## 2014-06-02 LAB — FENTANYL (GC/LC/MS), URINE
Fentanyl, confirm: 129.3 ng/mL — AB (ref <0.5)
Norfentanyl, confirm: >1000 ng/mL — AB (ref <0.5)

## 2014-06-03 ENCOUNTER — Ambulatory Visit (HOSPITAL_COMMUNITY)
Admission: RE | Admit: 2014-06-03 | Discharge: 2014-06-03 | Disposition: A | Discharge status: Home / Self Care | Payer: Medicaid Other | Source: Ambulatory Visit | Attending: Family Medicine | Admitting: Family Medicine

## 2014-06-03 ENCOUNTER — Other Ambulatory Visit: Payer: Self-pay | Admitting: Family Medicine

## 2014-06-03 ENCOUNTER — Telehealth: Payer: Self-pay | Admitting: Family Medicine

## 2014-06-03 DIAGNOSIS — O0930 Supervision of pregnancy with insufficient antenatal care, unspecified trimester: Secondary | ICD-10-CM

## 2014-06-03 DIAGNOSIS — Z3689 Encounter for other specified antenatal screening: Secondary | ICD-10-CM | POA: Insufficient documentation

## 2014-06-03 LAB — PRESCRIPTION MONITORING PROFILE (19 PANEL)
BARBITURATE SCREEN, URINE: NEGATIVE ng/mL
Benzodiazepine Screen, Urine: NEGATIVE ng/mL
Buprenorphine, Urine: NEGATIVE ng/mL
CARISOPRODOL, URINE: NEGATIVE ng/mL
Creatinine, Urine: 247.66 mg/dL (ref >20.0)
METHAQUALONE SCREEN (URINE): NEGATIVE ng/mL
NITRITES URINE, INITIAL: NEGATIVE ug/mL
PROPOXYPHENE: NEGATIVE ng/mL
Phencyclidine, Ur: NEGATIVE ng/mL
TAPENTADOLUR: NEGATIVE ng/mL
Tramadol Scrn, Ur: NEGATIVE ng/mL
Zolpidem, Urine: NEGATIVE ng/mL
pH, Initial: 7.1 pH (ref 4.5–8.9)

## 2014-06-03 MED ORDER — AMPICILLIN 500 MG PO CAPS
500.0000 mg | ORAL_CAPSULE | Freq: Four times a day (QID) | ORAL | Status: DC
Start: 2014-06-03 — End: 2014-06-11

## 2014-06-03 NOTE — Telephone Encounter (Signed)
Called patient at mobile and home number and both numbers were invalid. Called emergency contact number and the mailbox was full. Patient has ultrasound appt this afternoon, will try to reach patient there.

## 2014-06-03 NOTE — Telephone Encounter (Signed)
UTI - sensitive to ampicillin 500mg  QID x7d  Nurses to call

## 2014-06-04 NOTE — Telephone Encounter (Signed)
Patient called back to front desk. I spoke with patient and gave her results. Patient had no further questions. Rx sent to [facility] on [address].

## 2014-06-04 NOTE — Telephone Encounter (Signed)
Called patient, no answer- left message that we are trying to reach you with some results, nothing urgent but please give us a call back at the clinics 

## 2014-06-06 ENCOUNTER — Encounter: Payer: Self-pay | Admitting: Family Medicine

## 2014-06-09 ENCOUNTER — Encounter: Payer: Medicaid Other | Admitting: Obstetrics and Gynecology

## 2014-06-11 ENCOUNTER — Telehealth: Payer: Self-pay | Admitting: *Deleted

## 2014-06-11 MED ORDER — AMPICILLIN 500 MG PO CAPS: 500.0000 mg | ORAL_CAPSULE | Freq: Four times a day (QID) | ORAL | Status: DC

## 2014-06-11 NOTE — Telephone Encounter (Signed)
Patient called and stated that she was told she had an rx at the pharmacy but nothing has been called in. I reviewed chart and saw that rx was printed. Order resent to pharmacy.

## 2014-06-14 ENCOUNTER — Inpatient Hospital Stay (HOSPITAL_COMMUNITY)
Admission: AD | Admit: 2014-06-14 | Discharge: 2014-06-22 | DRG: 765 | Disposition: A | Discharge status: Home / Self Care | Payer: Medicaid Other | Source: Ambulatory Visit | Attending: Obstetrics & Gynecology | Admitting: Obstetrics & Gynecology

## 2014-06-14 ENCOUNTER — Encounter (HOSPITAL_COMMUNITY): Payer: Self-pay | Admitting: *Deleted

## 2014-06-14 ENCOUNTER — Inpatient Hospital Stay (HOSPITAL_COMMUNITY): Payer: Medicaid Other

## 2014-06-14 DIAGNOSIS — O4100X0 Oligohydramnios, unspecified trimester, not applicable or unspecified: Secondary | ICD-10-CM | POA: Diagnosis present

## 2014-06-14 DIAGNOSIS — F1920 Other psychoactive substance dependence, uncomplicated: Secondary | ICD-10-CM | POA: Diagnosis present

## 2014-06-14 DIAGNOSIS — Z98891 History of uterine scar from previous surgery: Secondary | ICD-10-CM

## 2014-06-14 DIAGNOSIS — F1120 Opioid dependence, uncomplicated: Secondary | ICD-10-CM | POA: Diagnosis present

## 2014-06-14 DIAGNOSIS — O99334 Smoking (tobacco) complicating childbirth: Secondary | ICD-10-CM | POA: Diagnosis present

## 2014-06-14 DIAGNOSIS — O212 Late vomiting of pregnancy: Secondary | ICD-10-CM | POA: Diagnosis present

## 2014-06-14 DIAGNOSIS — O99324 Drug use complicating childbirth: Secondary | ICD-10-CM

## 2014-06-14 DIAGNOSIS — F319 Bipolar disorder, unspecified: Secondary | ICD-10-CM | POA: Diagnosis present

## 2014-06-14 DIAGNOSIS — O34599 Maternal care for other abnormalities of gravid uterus, unspecified trimester: Secondary | ICD-10-CM | POA: Diagnosis present

## 2014-06-14 DIAGNOSIS — O4590 Premature separation of placenta, unspecified, unspecified trimester: Principal | ICD-10-CM | POA: Diagnosis present

## 2014-06-14 DIAGNOSIS — O0973 Supervision of high risk pregnancy due to social problems, third trimester: Secondary | ICD-10-CM

## 2014-06-14 DIAGNOSIS — O99344 Other mental disorders complicating childbirth: Secondary | ICD-10-CM | POA: Diagnosis present

## 2014-06-14 DIAGNOSIS — F1110 Opioid abuse, uncomplicated: Secondary | ICD-10-CM | POA: Diagnosis present

## 2014-06-14 DIAGNOSIS — B1920 Unspecified viral hepatitis C without hepatic coma: Secondary | ICD-10-CM | POA: Diagnosis present

## 2014-06-14 DIAGNOSIS — O4593 Premature separation of placenta, unspecified, third trimester: Secondary | ICD-10-CM | POA: Diagnosis present

## 2014-06-14 DIAGNOSIS — O26619 Liver and biliary tract disorders in pregnancy, unspecified trimester: Secondary | ICD-10-CM | POA: Diagnosis present

## 2014-06-14 DIAGNOSIS — O34219 Maternal care for unspecified type scar from previous cesarean delivery: Secondary | ICD-10-CM | POA: Diagnosis present

## 2014-06-14 DIAGNOSIS — F200 Paranoid schizophrenia: Secondary | ICD-10-CM | POA: Diagnosis present

## 2014-06-14 HISTORY — DX: Premature separation of placenta, unspecified, third trimester: O45.93

## 2014-06-14 LAB — CBC
HEMATOCRIT: 36.1 % (ref 36.0–46.0)
Hemoglobin: 11.8 g/dL — ABNORMAL LOW (ref 12.0–15.0)
MCH: 26.6 pg (ref 26.0–34.0)
MCHC: 32.7 g/dL (ref 30.0–36.0)
MCV: 81.3 fL (ref 78.0–100.0)
Platelets: 339 10*3/uL (ref 150–400)
RBC: 4.44 MIL/uL (ref 3.87–5.11)
RDW: 14 % (ref 11.5–15.5)
WBC: 10.4 10*3/uL (ref 4.0–10.5)

## 2014-06-14 LAB — RAPID URINE DRUG SCREEN, HOSP PERFORMED
Amphetamines: POSITIVE — AB
BARBITURATES: NOT DETECTED
Benzodiazepines: NOT DETECTED
COCAINE: POSITIVE — AB
Opiates: POSITIVE — AB
TETRAHYDROCANNABINOL: POSITIVE — AB

## 2014-06-14 LAB — URINE MICROSCOPIC-ADD ON

## 2014-06-14 LAB — URINALYSIS, ROUTINE W REFLEX MICROSCOPIC
BILIRUBIN URINE: NEGATIVE
Glucose, UA: NEGATIVE mg/dL
KETONES UR: 15 mg/dL — AB
NITRITE: NEGATIVE
Protein, ur: 30 mg/dL — AB
SPECIFIC GRAVITY, URINE: 1.02 (ref 1.005–1.030)
Urobilinogen, UA: 1 mg/dL (ref 0.0–1.0)
pH: 7 (ref 5.0–8.0)

## 2014-06-14 LAB — PREPARE RBC (CROSSMATCH)

## 2014-06-14 MED ORDER — PRENATAL MULTIVITAMIN CH
1.0000 | ORAL_TABLET | Freq: Every day | ORAL | Status: DC
  Administered 2014-06-16 – 2014-06-17 (×2): 1 via ORAL
  Filled 2014-06-14 (×3): qty 1

## 2014-06-14 MED ORDER — AMPICILLIN 500 MG PO CAPS
500.0000 mg | ORAL_CAPSULE | Freq: Four times a day (QID) | ORAL | Status: DC
  Administered 2014-06-14 – 2014-06-18 (×14): 500 mg via ORAL
  Filled 2014-06-14 (×20): qty 1

## 2014-06-14 MED ORDER — METHADONE HCL 10 MG/ML PO CONC
90.0000 mg | Freq: Every day | ORAL | Status: AC
  Filled 2014-06-14: qty 9

## 2014-06-14 MED ORDER — METHADONE HCL 10 MG PO TABS: 90.0000 mg | ORAL_TABLET | Freq: Every day | ORAL | Status: DC

## 2014-06-14 MED ORDER — CALCIUM CARBONATE ANTACID 500 MG PO CHEW
2.0000 | CHEWABLE_TABLET | ORAL | Status: DC | PRN
  Administered 2014-06-17: 400 mg via ORAL
  Filled 2014-06-14: qty 2
  Filled 2014-06-14: qty 1

## 2014-06-14 MED ORDER — ZOLPIDEM TARTRATE 5 MG PO TABS: 5.0000 mg | ORAL_TABLET | Freq: Every evening | ORAL | Status: DC | PRN

## 2014-06-14 MED ORDER — TERBUTALINE SULFATE 1 MG/ML IJ SOLN
0.2500 mg | Freq: Once | INTRAMUSCULAR | Status: AC
  Administered 2014-06-14: 0.25 mg via SUBCUTANEOUS
  Filled 2014-06-14: qty 1

## 2014-06-14 MED ORDER — ACETAMINOPHEN 325 MG PO TABS
650.0000 mg | ORAL_TABLET | ORAL | Status: DC | PRN
  Administered 2014-06-16: 650 mg via ORAL
  Filled 2014-06-14: qty 2

## 2014-06-14 MED ORDER — LACTATED RINGERS IV SOLN
INTRAVENOUS | Status: DC
  Administered 2014-06-14 – 2014-06-17 (×6): via INTRAVENOUS

## 2014-06-14 MED ORDER — DOCUSATE SODIUM 100 MG PO CAPS
100.0000 mg | ORAL_CAPSULE | Freq: Every day | ORAL | Status: DC
  Administered 2014-06-14 – 2014-06-17 (×3): 100 mg via ORAL
  Filled 2014-06-14 (×5): qty 1

## 2014-06-14 NOTE — H&P (Signed)
CSN: [id number]     Arrival date & time 06/14/14  1551 History     None      Chief Complaint   Patient presents with   .  Vaginal Bleeding        (Consider location/radiation/quality/duration/timing/severity/associated sxs/prior Treatment) HPI Comments: Sm amt pinkish brown with SVE, cervix ft/60/post/-2   Vaginal Bleeding The patient's primary symptoms include vaginal bleeding. This is a new problem. The current episode started today. The problem occurs constantly. The problem has been unchanged. The patient is experiencing no pain. She is pregnant.  [id number] at 35 week in with c/o pressure and vag bleeding. States sex 3 days ago. Prev c/s x 2    Past Medical History   Diagnosis  Date   .  Bipolar 1 disorder     .  Paranoid schizophrenia     .  Cellulitis and abscess     .  Substance abuse      Past Surgical History   Procedure  Laterality  Date   .  Cesarean section       .  Wisdom tooth extraction        Family History   Problem  Relation  Age of Onset   .  Cancer  Mother     .  Depression  Mother     .  Diabetes  Father     .  Cancer  Maternal Grandmother     .  Cancer  Maternal Grandfather      History   Substance Use Topics   .  Smoking status:  Current Every Day Smoker -- 0.50 packs/day       Types:  Cigarettes   .  Smokeless tobacco:  Never Used   .  Alcohol Use:  No    OB History     Grav  Para  Term  Preterm  Abortions  TAB  SAB  Ect  Mult  Living     4  2  2   0  1  1        2        Review of Systems  Constitutional: Negative.   HENT: Negative.   Eyes: Negative.   Respiratory: Negative.   Cardiovascular: Negative.   Gastrointestinal: Negative.   Endocrine: Negative.   Genitourinary: Positive for vaginal bleeding.  Allergic/Immunologic: Negative.   Neurological: Negative.   Hematological: Negative.   Psychiatric/Behavioral: Negative.          Allergies    Ciprofloxacin    Home Medications       Prior to Admission medications     Medication  Sig  Start Date  End Date  Taking?  Authorizing Dacia Capers   ampicillin (PRINCIPEN) 500 MG capsule  Take 1 capsule (500 mg total) by mouth 4 (four) times daily.  06/11/14    Yes  Minta BalsamMichael R Odom, MD   Docusate Calcium (STOOL SOFTENER PO)  Take 1 capsule by mouth daily as needed (for constipation.).      Yes  Historical Adanely Reynoso, MD   methadone (DOLOPHINE) 10 MG/ML solution  Take 90 mg by mouth daily.       Yes  Historical Cerys Winget, MD    BP 114/74  Pulse 96  Temp(Src) 97.6 F (36.4 C)  Resp 18  Ht 5\' 1"  (1.549 m)  Wt 109 lb (49.442 kg)  BMI 20.61 kg/m2  LMP 09/06/2013 Physical Exam  Constitutional: She is oriented to person, place, and time. She appears well-developed and well-nourished.  HENT:   Head: Normocephalic.  Neck: Normal range of motion.  Cardiovascular: Normal rate, regular rhythm, normal heart sounds and intact distal pulses.   Pulmonary/Chest: Effort normal and breath sounds normal.  Abdominal: Soft. Bowel sounds are normal.  Genitourinary: Vagina normal and uterus normal.  Musculoskeletal: Normal range of motion.  Neurological: She is alert and oriented to person, place, and time. She has normal reflexes.  Skin: Skin is warm and dry.  Psychiatric: She has a normal mood and affect. Her behavior is normal. Thought content normal.     ED Course    Procedures (including critical care time) Labs Review Labs Reviewed   URINALYSIS, ROUTINE W REFLEX MICROSCOPIC - Abnormal; Notable for the following:      Hgb urine dipstick  LARGE (*)       Ketones, ur  15 (*)       Protein, ur  30 (*)       Leukocytes, UA  SMALL (*)       All other components within normal limits   URINE RAPID DRUG SCREEN (HOSP PERFORMED) - Abnormal; Notable for the following:      Opiates  POSITIVE (*)       Cocaine  POSITIVE (*)       Amphetamines  POSITIVE (*)       Tetrahydrocannabinol  POSITIVE (*)       All other components within normal limits   URINE MICROSCOPIC-ADD ON - Abnormal;  Notable for the following:      Squamous Epithelial / LPF  FEW (*)       Bacteria, UA  MANY (*)       All other components within normal limits      Imaging Review No results found.   EKG Interpretation None         MDM       Final diagnoses:   None      preg at 35 wks, scant amt vag brownish pink bleeding.   Pos drug screen for , cocaine, opiates, amphetimines, THC Suspect abruption. Will consult Dr. Marice Potterove.         Ferdie PingMarie Darlene Lawson, CNM Certified Nurse Midwife Cosign Needed  Service date: 06/14/2014 4:08 PM  Edit Abel PrestoDelete Bookmark Copy   CSN: [id number]     Arrival date & time 06/14/14  1551 History     None     No chief complaint on file.     (Consider location/radiation/quality/duration/timing/severity/associated sxs/prior Treatment) HPI Comments: Sm amt pinkish brown with SVE, cervix ft/60/post/-2   Vaginal Bleeding The patient's primary symptoms include vaginal bleeding. This is a new problem. The current episode started today. The problem occurs constantly. The problem has been unchanged. The patient is experiencing no pain. She is pregnant.  [id number] at 35 week in with c/o pressure and vag bleeding. States sex 3 days ago. Prev c/s x 2    Past Medical History   Diagnosis  Date   .  Bipolar 1 disorder     .  Paranoid schizophrenia     .  Cellulitis and abscess     .  Substance abuse      Past Surgical History   Procedure  Laterality  Date   .  Cesarean section       .  Wisdom tooth extraction        Family History   Problem  Relation  Age of Onset   .  Cancer  Mother     .  Depression  Mother     .  Diabetes  Father     .  Cancer  Maternal Grandmother     .  Cancer  Maternal Grandfather      History   Substance Use Topics   .  Smoking status:  Current Every Day Smoker -- 0.50 packs/day       Types:  Cigarettes   .  Smokeless tobacco:  Never Used   .  Alcohol Use:  No    OB History     Grav  Para  Term  Preterm  Abortions  TAB  SAB   Ect  Mult  Living     4  2  2   0  1  1        2        Review of Systems  Constitutional: Negative.   HENT: Negative.   Eyes: Negative.   Respiratory: Negative.   Cardiovascular: Negative.   Gastrointestinal: Negative.   Endocrine: Negative.   Genitourinary: Positive for vaginal bleeding.  Allergic/Immunologic: Negative.   Neurological: Negative.   Hematological: Negative.   Psychiatric/Behavioral: Negative.          Allergies    Ciprofloxacin    Home Medications       Prior to Admission medications    Medication  Sig  Start Date  End Date  Taking?  Authorizing Irvin Bastin   ampicillin (PRINCIPEN) 500 MG capsule  Take 1 capsule (500 mg total) by mouth 4 (four) times daily.  06/11/14      Minta Balsam, MD   methadone (DOLOPHINE) 10 MG/ML solution  Take 80 mg by mouth daily.        Historical Thomson Herbers, MD    LMP 09/06/2013 Physical Exam  Constitutional: She is oriented to person, place, and time. She appears well-developed and well-nourished.  HENT:   Head: Normocephalic.  Neck: Normal range of motion.  Cardiovascular: Normal rate, regular rhythm, normal heart sounds and intact distal pulses.   Pulmonary/Chest: Effort normal and breath sounds normal.  Abdominal: Soft. Bowel sounds are normal.  Genitourinary: Vagina normal and uterus normal.  Musculoskeletal: Normal range of motion.  Neurological: She is alert and oriented to person, place, and time. She has normal reflexes.  Skin: Skin is warm and dry.  Psychiatric: She has a normal mood and affect. Her behavior is normal. Thought content normal.     ED Course    Procedures (including critical care time) Labs Review Labs Reviewed   URINALYSIS, ROUTINE W REFLEX MICROSCOPIC      Imaging Review No results found.   EKG Interpretation None         MDM       Final diagnoses:   None      preg at 35 wks, scant amt vag brownish pink bleeding.   Pos drug screen for , cocaine, opiates, amphetimines,  THC Suspect abruption. Will consult Dr. Marice Potter.  Revision History...     Date/Time User Action   06/14/2014 5:53 PM Ferdie Ping, CNM Sign   06/14/2014 4:15 PM Ferdie Ping, CNM Sign  View Details Report                  Patient Status  click to open    New Reading Doc Flowsheets   Patient Status   06/14/14 1618  06/14/14 1722   Patient Status  Status  Results Pending  Observation  Medical Screening Exam  click to open    No data available.          Disposition  click to open    New ReadingGo to Doc Flowsheets No data found.

## 2014-06-14 NOTE — MAU Note (Signed)
Pt presents to MAU with complaints of vaginal bleeding that started this morning. PT had intercourse 3 days ago.

## 2014-06-14 NOTE — MAU Note (Signed)
Briana Zhang in to perform bedside ultrasound.

## 2014-06-14 NOTE — Plan of Care (Signed)
Problem: Consults Goal: Birthing Suites Patient Information Press F2 to bring up selections list Outcome: Completed/Met Date Met:  06/14/14  Pt < [redacted] weeks EGA

## 2014-06-14 NOTE — MAU Note (Signed)
Assumed care of patient.

## 2014-06-14 NOTE — MAU Provider Note (Signed)
CSN: [id number]     Arrival date & time 06/14/14  1551 History   None    No chief complaint on file.    (Consider location/radiation/quality/duration/timing/severity/associated sxs/prior Treatment) HPI Comments: Sm amt pinkish brown with SVE, cervix ft/60/post/-2  Vaginal Bleeding The patient's primary symptoms include vaginal bleeding. This is a new problem. The current episode started today. The problem occurs constantly. The problem has been unchanged. The patient is experiencing no pain. She is pregnant.   [id number] at 35 week in with c/o pressure and vag bleeding. States sex 3 days ago. Prev c/s x 2  Past Medical History  Diagnosis Date  . Bipolar 1 disorder   . Paranoid schizophrenia   . Cellulitis and abscess   . Substance abuse    Past Surgical History  Procedure Laterality Date  . Cesarean section    . Wisdom tooth extraction     Family History  Problem Relation Age of Onset  . Cancer Mother   . Depression Mother   . Diabetes Father   . Cancer Maternal Grandmother   . Cancer Maternal Grandfather    History  Substance Use Topics  . Smoking status: Current Every Day Smoker -- 0.50 packs/day    Types: Cigarettes  . Smokeless tobacco: Never Used  . Alcohol Use: No   OB History   Grav Para Term Preterm Abortions TAB SAB Ect Mult Living   4 2 2  0 1 1    2      Review of Systems  Constitutional: Negative.   HENT: Negative.   Eyes: Negative.   Respiratory: Negative.   Cardiovascular: Negative.   Gastrointestinal: Negative.   Endocrine: Negative.   Genitourinary: Positive for vaginal bleeding.  Allergic/Immunologic: Negative.   Neurological: Negative.   Hematological: Negative.   Psychiatric/Behavioral: Negative.       Allergies  Ciprofloxacin  Home Medications   Prior to Admission medications   Medication Sig Start Date End Date Taking? Authorizing Provider  ampicillin (PRINCIPEN) 500 MG capsule Take 1 capsule (500 mg total) by mouth 4 (four) times  daily. 06/11/14   Minta BalsamMichael R Odom, MD  methadone (DOLOPHINE) 10 MG/ML solution Take 80 mg by mouth daily.    Historical Provider, MD   LMP 09/06/2013 Physical Exam  Constitutional: She is oriented to person, place, and time. She appears well-developed and well-nourished.  HENT:  Head: Normocephalic.  Neck: Normal range of motion.  Cardiovascular: Normal rate, regular rhythm, normal heart sounds and intact distal pulses.   Pulmonary/Chest: Effort normal and breath sounds normal.  Abdominal: Soft. Bowel sounds are normal.  Genitourinary: Vagina normal and uterus normal.  Musculoskeletal: Normal range of motion.  Neurological: She is alert and oriented to person, place, and time. She has normal reflexes.  Skin: Skin is warm and dry.  Psychiatric: She has a normal mood and affect. Her behavior is normal. Thought content normal.    ED Course  Procedures (including critical care time) Labs Review Labs Reviewed  URINALYSIS, ROUTINE W REFLEX MICROSCOPIC    Imaging Review No results found.   EKG Interpretation None      MDM   Final diagnoses:  None    preg at 35 wks, scant amt vag brownish pink bleeding.  Pos drug screen for , cocaine, opiates, amphetimines, THC Suspect abruption. Will consult Dr. Marice Potterove.

## 2014-06-14 NOTE — MAU Provider Note (Signed)
CSN: [id number]     Arrival date & time 06/14/14  1551 History   None    Chief Complaint  Patient presents with  . Vaginal Bleeding     (Consider location/radiation/quality/duration/timing/severity/associated sxs/prior Treatment) HPI Comments: Sm amt pinkish brown with SVE, cervix ft/60/post/-2  Vaginal Bleeding The patient's primary symptoms include vaginal bleeding. This is a new problem. The current episode started today. The problem occurs constantly. The problem has been unchanged. The patient is experiencing no pain. She is pregnant.   [id number] at 35 week in with c/o pressure and vag bleeding. States sex 3 days ago. Prev c/s x 2  Past Medical History  Diagnosis Date  . Bipolar 1 disorder   . Paranoid schizophrenia   . Cellulitis and abscess   . Substance abuse    Past Surgical History  Procedure Laterality Date  . Cesarean section    . Wisdom tooth extraction     Family History  Problem Relation Age of Onset  . Cancer Mother   . Depression Mother   . Diabetes Father   . Cancer Maternal Grandmother   . Cancer Maternal Grandfather    History  Substance Use Topics  . Smoking status: Current Every Day Smoker -- 0.50 packs/day    Types: Cigarettes  . Smokeless tobacco: Never Used  . Alcohol Use: No   OB History   Grav Para Term Preterm Abortions TAB SAB Ect Mult Living   4 2 2  0 1 1    2      Review of Systems  Constitutional: Negative.   HENT: Negative.   Eyes: Negative.   Respiratory: Negative.   Cardiovascular: Negative.   Gastrointestinal: Negative.   Endocrine: Negative.   Genitourinary: Positive for vaginal bleeding.  Allergic/Immunologic: Negative.   Neurological: Negative.   Hematological: Negative.   Psychiatric/Behavioral: Negative.       Allergies  Ciprofloxacin  Home Medications   Prior to Admission medications   Medication Sig Start Date End Date Taking? Authorizing Latangela Mccomas  ampicillin (PRINCIPEN) 500 MG capsule Take 1 capsule (500  mg total) by mouth 4 (four) times daily. 06/11/14  Yes Minta BalsamMichael R Odom, MD  Docusate Calcium (STOOL SOFTENER PO) Take 1 capsule by mouth daily as needed (for constipation.).   Yes Historical Lavone Weisel, MD  methadone (DOLOPHINE) 10 MG/ML solution Take 90 mg by mouth daily.    Yes Historical Aneesa Romey, MD   BP 114/74  Pulse 96  Temp(Src) 97.6 F (36.4 C)  Resp 18  Ht 5\' 1"  (1.549 m)  Wt 109 lb (49.442 kg)  BMI 20.61 kg/m2  LMP 09/06/2013 Physical Exam  Constitutional: She is oriented to person, place, and time. She appears well-developed and well-nourished.  HENT:  Head: Normocephalic.  Neck: Normal range of motion.  Cardiovascular: Normal rate, regular rhythm, normal heart sounds and intact distal pulses.   Pulmonary/Chest: Effort normal and breath sounds normal.  Abdominal: Soft. Bowel sounds are normal.  Genitourinary: Vagina normal and uterus normal.  Musculoskeletal: Normal range of motion.  Neurological: She is alert and oriented to person, place, and time. She has normal reflexes.  Skin: Skin is warm and dry.  Psychiatric: She has a normal mood and affect. Her behavior is normal. Thought content normal.    ED Course  Procedures (including critical care time) Labs Review Labs Reviewed  URINALYSIS, ROUTINE W REFLEX MICROSCOPIC - Abnormal; Notable for the following:    Hgb urine dipstick LARGE (*)    Ketones, ur 15 (*)    Protein,  ur 30 (*)    Leukocytes, UA SMALL (*)    All other components within normal limits  URINE RAPID DRUG SCREEN (HOSP PERFORMED) - Abnormal; Notable for the following:    Opiates POSITIVE (*)    Cocaine POSITIVE (*)    Amphetamines POSITIVE (*)    Tetrahydrocannabinol POSITIVE (*)    All other components within normal limits  URINE MICROSCOPIC-ADD ON - Abnormal; Notable for the following:    Squamous Epithelial / LPF FEW (*)    Bacteria, UA MANY (*)    All other components within normal limits    Imaging Review No results found.   EKG  Interpretation None      MDM   Final diagnoses:  None    preg at 35 wks, scant amt vag brownish pink bleeding.  Pos drug screen for , cocaine, opiates, amphetimines, THC Suspect abruption. Will consult Dr. Marice Potterove.

## 2014-06-15 ENCOUNTER — Encounter (HOSPITAL_COMMUNITY): Payer: Self-pay | Admitting: General Practice

## 2014-06-15 LAB — GROUP B STREP BY PCR: Group B strep by PCR: NEGATIVE

## 2014-06-15 LAB — CULTURE, OB URINE
CULTURE: NO GROWTH
Colony Count: NO GROWTH
Special Requests: NORMAL

## 2014-06-15 LAB — OB RESULTS CONSOLE GBS: GBS: NEGATIVE

## 2014-06-15 MED ORDER — METHADONE HCL 10 MG/ML PO CONC
90.0000 mg | ORAL | Status: DC
  Administered 2014-06-15 – 2014-06-18 (×4): 90 mg via ORAL
  Filled 2014-06-15 (×3): qty 9

## 2014-06-15 MED ORDER — BETAMETHASONE SOD PHOS & ACET 6 (3-3) MG/ML IJ SUSP
12.0000 mg | Freq: Every day | INTRAMUSCULAR | Status: AC
  Administered 2014-06-15 – 2014-06-16 (×2): 12 mg via INTRAMUSCULAR
  Filled 2014-06-15 (×2): qty 2

## 2014-06-15 MED ORDER — OXYCODONE-ACETAMINOPHEN 5-325 MG PO TABS
2.0000 | ORAL_TABLET | Freq: Once | ORAL | Status: AC
  Administered 2014-06-15: 2 via ORAL
  Filled 2014-06-15: qty 2

## 2014-06-15 MED ORDER — MAGNESIUM SULFATE 40 G IN LACTATED RINGERS - SIMPLE
2.0000 g/h | INTRAVENOUS | Status: DC
  Administered 2014-06-16: 2 g/h via INTRAVENOUS
  Filled 2014-06-15 (×2): qty 500

## 2014-06-15 MED ORDER — MAGNESIUM SULFATE BOLUS VIA INFUSION
4.0000 g | Freq: Once | INTRAVENOUS | Status: AC
  Administered 2014-06-15: 4 g via INTRAVENOUS
  Filled 2014-06-15: qty 500

## 2014-06-15 MED ORDER — PENICILLIN G POTASSIUM 5000000 UNITS IJ SOLR
5.0000 10*6.[IU] | Freq: Once | INTRAVENOUS | Status: AC
  Administered 2014-06-15: 5 10*6.[IU] via INTRAVENOUS
  Filled 2014-06-15: qty 5

## 2014-06-15 MED ORDER — DEXTROSE 5 % IV SOLN
2.5000 10*6.[IU] | INTRAVENOUS | Status: DC
  Filled 2014-06-15 (×4): qty 2.5

## 2014-06-15 NOTE — Progress Notes (Signed)
Patient ID: Briana Zhang, female   DOB: 05/20/1981, 33 y.o.   MRN: [id number] HD #2  S. She reports that her bleeding has now changed to dark brown. She reports good FM, denies ROM, and says that her contractions have lessened greatly.  O. VSS, AF      FHR - category 1, rare contraction      Only dark brown blood seen on her pad from overnight  A/P. 35.[redacted] weeks EGA with abruption- continue monitoring and delivery (RCS) for worsened bleeding.

## 2014-06-15 NOTE — Progress Notes (Signed)
[facility] called, Methadone done verified at 90 mg.

## 2014-06-15 NOTE — Progress Notes (Signed)
Ur chart review completed.  

## 2014-06-16 ENCOUNTER — Inpatient Hospital Stay (HOSPITAL_BASED_OUTPATIENT_CLINIC_OR_DEPARTMENT_OTHER): Payer: Medicaid Other

## 2014-06-16 DIAGNOSIS — O4590 Premature separation of placenta, unspecified, unspecified trimester: Secondary | ICD-10-CM | POA: Diagnosis not present

## 2014-06-16 DIAGNOSIS — F1120 Opioid dependence, uncomplicated: Secondary | ICD-10-CM | POA: Diagnosis not present

## 2014-06-16 NOTE — Progress Notes (Signed)
Patient ID: Briana Zhang, female   DOB: 05/31/1981, 33 y.o.   MRN: [id number] FACULTY PRACTICE ANTEPARTUM(COMPREHENSIVE) NOTE  Briana Russianshley K Dobis is a 33 y.o. [id number] at 4762w1d  who is admitted for Preterm labor and vaginal bleeding. Length of Stay:  2  Days  Subjective: Patient reports the fetal movement as active. Patient reports uterine contraction  activity as rare cramping. Patient reports  vaginal bleeding as none. Patient describes fluid per vagina as None.  Vitals:  Blood pressure 115/57, pulse 73, temperature 98.1 F (36.7 C), temperature source Oral, resp. rate 16, height 5\' 1"  (1.549 m), weight 109 lb (49.442 kg), last menstrual period 09/06/2013, SpO2 99.00%. Physical Examination:  General appearance - alert, well appearing, and in no distress Abdomen - soft, nontender, nondistended, no masses or organomegaly Extremities - Homan's sign negative bilaterally  Fetal Monitoring:  Baseline: 120 bpm, Variability: Good {> 6 bpm), Accelerations: Reactive and Decelerations: Absent  Labs:  Results for orders placed during the hospital encounter of 06/14/14 (from the past 24 hour(s))  GROUP B STREP BY PCR   Collection Time    06/15/14 10:10 AM      Result Value Ref Range   Group B strep by PCR NEGATIVE  NEGATIVE    Imaging Studies:    Growth and dopplers at 2 weeks from last US  Medications:  Scheduled . ampicillin  500 mg Oral 4 times per day  . betamethasone acetate-betamethasone sodium phosphate  12 mg Intramuscular Daily  . docusate sodium  100 mg Oral Daily  . methadone  90 mg Oral Q24H  . prenatal multivitamin  1 tablet Oral Q1200   I have reviewed the patient's current medications.  ASSESSMENT: Patient Active Problem List   Diagnosis Date Noted  . Placental abruption in third trimester 06/14/2014  . Supervision of high risk pregnancy due to social problems in third trimester 05/27/2014  . 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  Transition to procardia as needed. Needs GCT in 1 week from last dose of betamethasone. Question whether bleeding was from preterm labor vs abruption.  Continuous fetal monitoring while magnesium is infusion.    LEGGETT,KELLY H. 06/16/2014,7:15 AM
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
## 2014-06-17 ENCOUNTER — Inpatient Hospital Stay (HOSPITAL_COMMUNITY): Payer: Medicaid Other

## 2014-06-17 DIAGNOSIS — O4590 Premature separation of placenta, unspecified, unspecified trimester: Secondary | ICD-10-CM

## 2014-06-17 DIAGNOSIS — F1120 Opioid dependence, uncomplicated: Secondary | ICD-10-CM

## 2014-06-17 LAB — TYPE AND SCREEN
ABO/RH(D): O POS
Antibody Screen: NEGATIVE

## 2014-06-17 MED ORDER — PANTOPRAZOLE SODIUM 40 MG PO TBEC: 40.0000 mg | DELAYED_RELEASE_TABLET | Freq: Once | ORAL | Status: DC

## 2014-06-17 MED ORDER — PANTOPRAZOLE SODIUM 40 MG PO TBEC
40.0000 mg | DELAYED_RELEASE_TABLET | Freq: Every day | ORAL | Status: DC
  Administered 2014-06-17: 40 mg via ORAL
  Filled 2014-06-17: qty 1

## 2014-06-17 MED ORDER — POLYETHYLENE GLYCOL 3350 17 G PO PACK
17.0000 g | PACK | Freq: Every day | ORAL | Status: DC
  Administered 2014-06-17: 17 g via ORAL
  Filled 2014-06-17 (×3): qty 1

## 2014-06-17 MED ORDER — NIFEDIPINE ER 30 MG PO TB24
30.0000 mg | ORAL_TABLET | Freq: Two times a day (BID) | ORAL | Status: DC
Start: 2014-06-17 — End: 2014-06-18
  Administered 2014-06-17 – 2014-06-18 (×2): 30 mg via ORAL
  Filled 2014-06-17 (×4): qty 1

## 2014-06-17 MED ORDER — NIFEDIPINE 10 MG PO CAPS
30.0000 mg | ORAL_CAPSULE | Freq: Once | ORAL | Status: AC | PRN
  Administered 2014-06-17: 30 mg via ORAL
  Filled 2014-06-17: qty 3

## 2014-06-17 MED ORDER — NIFEDIPINE 10 MG PO CAPS
30.0000 mg | ORAL_CAPSULE | Freq: Once | ORAL | Status: AC
  Administered 2014-06-17: 30 mg via ORAL
  Filled 2014-06-17: qty 3

## 2014-06-17 NOTE — Progress Notes (Signed)
CSW planned to meet with patient this morning to have her contact her counselor at [facility] in order to give permission for CSW to speak with her to get information as soon as possible to fax with [facility], but patient has been in MFM this morning.  CSW asked bedside RN to call CSW as soon as patient returns.  CSW attempted to call [facility], but there was no answer and no way to leave a message.

## 2014-06-17 NOTE — Progress Notes (Signed)
CSW met with patient to discuss need to speak with her counselor at [facility] for information needed to make referral to [facility] for inpatient substance abuse treatment.  Patient attempted to call her counselor but receptionist stated she was unavailable.  Patient left message with receptionist requesting a call back from counselor today if possible.  CSW explained to patient what is needed from the clinic and asked her to give her counselor permission to call CSW and give counselor CSW's phone number.  CSW also wrote down exactly what she needs to explain to her counselor before asking the counselor to call CSW.  Patient stated understanding.  Patient states she is feeling well today and plans to take a shower now.  She states she has talked to a friend and thinks she will be able to have transportation to [facility] in [place] is a bed is secured.  CSW explained that if patient is medically ready for discharge from [facility] prior to a bed becoming available at [name], she will have to remain in contact with CSW by phone so we can continue to coordinate admission.  Patient states understanding.  CSW will continue to follow closely.

## 2014-06-17 NOTE — Progress Notes (Signed)
Patient ID: Briana Zhang, female   DOB: 08/25/1981, 33 y.o.   MRN: [id number] FACULTY PRACTICE ANTEPARTUM(COMPREHENSIVE) NOTE  Briana Zhang is a 33 y.o. [id number] at 2578w2d  who is admitted for Preterm labor and vaginal bleeding. Length of Stay:  3  Days  Subjective: Patient reports feeling well. She reports noticing some scant amount of blood when wiping Patient reports the fetal movement as active. Patient reports uterine contraction  activity as rare cramping. Patient reports  vaginal bleeding as none. Patient describes fluid per vagina as None.  Vitals:  Blood pressure 109/53, pulse 65, temperature 97.8 F (36.6 C), temperature source Oral, resp. rate 12, height 5\' 1"  (1.549 m), weight 109 lb (49.442 kg), last menstrual period 09/06/2013, SpO2 99.00%. Physical Examination:  General appearance - alert, well appearing, and in no distress Abdomen - soft, nontender, nondistended, no masses or organomegaly Extremities - Homan's sign negative bilaterally  Fetal Monitoring:  Baseline: 130 bpm, Variability: Good {> 6 bpm), Accelerations: Reactive, Decelerations: Absent and Toco: irregular contractions  Labs:  No results found for this or any previous visit (from the past 24 hour(s)).  Imaging Studies:    Growth and dopplers at 2 weeks from last US  Medications:  Scheduled . ampicillin  500 mg Oral 4 times per day  . docusate sodium  100 mg Oral Daily  . methadone  90 mg Oral Q24H  . prenatal multivitamin  1 tablet Oral Q1200   I have reviewed the patient's current medications.  ASSESSMENT: Patient Active Problem List   Diagnosis Date Noted  . Placental abruption in third trimester 06/14/2014  . Supervision of high risk pregnancy due to social problems in third trimester 05/27/2014  . 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

## 2014-06-17 NOTE — Progress Notes (Signed)
Patient ID: Briana Zhang, female   DOB: 04/03/1981, 33 y.o.   MRN: [id number]  Sterile speculum exam to rule out PPROM.  Small pool of milky pink/red fluid.  No ferning.  Cervix Dilation: 1 Effacement (%): 80 Cervical Position: Anterior Station: -1 Presentation: Vertex Exam by:: Williams cnm

## 2014-06-17 NOTE — Progress Notes (Signed)
CSW has not yet received a call or any faxed information from [facility] in order to make [facility]  CSW will follow up again tomorrow.

## 2014-06-17 NOTE — Progress Notes (Signed)
Reviewed the Fetal monitoring strip with Dr Despina HiddenEure.

## 2014-06-17 NOTE — Progress Notes (Signed)
[facility] ultrasound  Indication: 33 yr old [id number] at 6925w2d by third trimester ultrasound with vaginal bleeding and drug use for fetal growth  Findings: 1. Single intrauterine pregnancy. 2. Estimated fetal weight is in the 34th%. All parameters are in the <5th% except for the abdomen. 3. Posterior placenta without evidence of previa. 4. Oligohydramnios; AFI is 2cm. 5. The view of the posterior fossa and ankles and hands remain limited. 6. The fetal heart appears large in proportion to the fetal chest. Small pericardial effusion measuring 4mm. 7. The remainder of the limited anatomy survey is normal. 8. Normal biophysical profile of 8/8. 9. Normal umbilical artery Doppler studies.  Recommendations: 1. Overall growth is appropriate however all parameters aside from abdominal circumference are lagging: - discussed with patient and patient's provider that given dating is so poor it is difficult to determine the growth pattern in this fetus - not typical pattern of asymmetric IUGR given abdominal circumference is normal and measures ahead of the rest of the biometry - patient had normal glucola - discussed could represent aneuploidy, constitutional, placental insufficiency, or skeletal dysplasia - discussed Doppler studies are normal - however there is oligohydramnios 2. Oligohydramnios: - recommend evaluate patient for rupture of membranes as patient has had bleeding over the last several days - would recommend continued inpatient management at this time given oligohydramnios - if not found to have rupture of membranes recommend repeat AFI in 2-5 days 3. Discussed fetal heart appears large in proportion to the chest: - heart structurally appears normal - there is a small effusion which may be physiologic given small size - unclear if this represents a small chest or an enlarged heart - would recommend fetal echocardigram if patient is discharged and close surveillance (at  least weekly) 4. Drug use: - patient is currently on methadone 5. Will need to evaluate evolving clinical picture to determine delivery timing (patient will deliver by repeat C section)  Eulis FosterKristen Quinn, MD

## 2014-06-18 ENCOUNTER — Inpatient Hospital Stay (HOSPITAL_COMMUNITY): Payer: Medicaid Other | Admitting: Anesthesiology

## 2014-06-18 ENCOUNTER — Encounter (HOSPITAL_COMMUNITY): Payer: Self-pay | Admitting: Anesthesiology

## 2014-06-18 ENCOUNTER — Encounter (HOSPITAL_COMMUNITY)
Admission: AD | Disposition: A | Discharge status: Home / Self Care | Payer: Self-pay | Source: Ambulatory Visit | Attending: Obstetrics & Gynecology

## 2014-06-18 ENCOUNTER — Encounter (HOSPITAL_COMMUNITY): Payer: Medicaid Other | Admitting: Anesthesiology

## 2014-06-18 DIAGNOSIS — Z98891 History of uterine scar from previous surgery: Secondary | ICD-10-CM

## 2014-06-18 DIAGNOSIS — F1120 Opioid dependence, uncomplicated: Secondary | ICD-10-CM

## 2014-06-18 DIAGNOSIS — O4590 Premature separation of placenta, unspecified, unspecified trimester: Secondary | ICD-10-CM

## 2014-06-18 DIAGNOSIS — O99324 Drug use complicating childbirth: Secondary | ICD-10-CM

## 2014-06-18 DIAGNOSIS — O26619 Liver and biliary tract disorders in pregnancy, unspecified trimester: Secondary | ICD-10-CM

## 2014-06-18 DIAGNOSIS — F1920 Other psychoactive substance dependence, uncomplicated: Secondary | ICD-10-CM

## 2014-06-18 DIAGNOSIS — O4100X0 Oligohydramnios, unspecified trimester, not applicable or unspecified: Secondary | ICD-10-CM

## 2014-06-18 HISTORY — DX: History of uterine scar from previous surgery: Z98.891

## 2014-06-18 LAB — TYPE AND SCREEN
ABO/RH(D): O POS
Antibody Screen: NEGATIVE
UNIT DIVISION: 0
UNIT DIVISION: 0
Unit division: 0
Unit division: 0

## 2014-06-18 LAB — CBC
HEMATOCRIT: 37.4 % (ref 36.0–46.0)
Hemoglobin: 12.2 g/dL (ref 12.0–15.0)
MCH: 26.8 pg (ref 26.0–34.0)
MCHC: 32.6 g/dL (ref 30.0–36.0)
MCV: 82.2 fL (ref 78.0–100.0)
PLATELETS: 307 10*3/uL (ref 150–400)
RBC: 4.55 MIL/uL (ref 3.87–5.11)
RDW: 14 % (ref 11.5–15.5)
WBC: 16.3 10*3/uL — AB (ref 4.0–10.5)

## 2014-06-18 SURGERY — Surgical Case
Anesthesia: Spinal

## 2014-06-18 MED ORDER — LACTATED RINGERS IV SOLN
INTRAVENOUS | Status: DC
  Administered 2014-06-18 (×2): via INTRAVENOUS

## 2014-06-18 MED ORDER — LIDOCAINE HCL 0.5 % IJ SOLN
INTRAMUSCULAR | Status: DC | PRN
  Administered 2014-06-18: 30 mL

## 2014-06-18 MED ORDER — DIPHENHYDRAMINE HCL 25 MG PO CAPS: 25.0000 mg | ORAL_CAPSULE | Freq: Four times a day (QID) | ORAL | Status: DC | PRN

## 2014-06-18 MED ORDER — ZOLPIDEM TARTRATE 5 MG PO TABS: 5.0000 mg | ORAL_TABLET | Freq: Every evening | ORAL | Status: DC | PRN

## 2014-06-18 MED ORDER — SIMETHICONE 80 MG PO CHEW
80.0000 mg | CHEWABLE_TABLET | ORAL | Status: DC | PRN
  Administered 2014-06-19: 80 mg via ORAL
  Filled 2014-06-18 (×4): qty 1

## 2014-06-18 MED ORDER — SENNOSIDES-DOCUSATE SODIUM 8.6-50 MG PO TABS
2.0000 | ORAL_TABLET | ORAL | Status: DC
  Administered 2014-06-19 – 2014-06-21 (×4): 2 via ORAL
  Filled 2014-06-18 (×4): qty 2

## 2014-06-18 MED ORDER — DIPHENHYDRAMINE HCL 50 MG/ML IJ SOLN: 12.5000 mg | Freq: Four times a day (QID) | INTRAMUSCULAR | Status: DC | PRN

## 2014-06-18 MED ORDER — TETANUS-DIPHTH-ACELL PERTUSSIS 5-2.5-18.5 LF-MCG/0.5 IM SUSP: 0.5000 mL | Freq: Once | INTRAMUSCULAR | Status: DC

## 2014-06-18 MED ORDER — METOCLOPRAMIDE HCL 5 MG/ML IJ SOLN: 10.0000 mg | Freq: Three times a day (TID) | INTRAMUSCULAR | Status: DC | PRN

## 2014-06-18 MED ORDER — NALOXONE HCL 0.4 MG/ML IJ SOLN: 0.4000 mg | INTRAMUSCULAR | Status: DC | PRN

## 2014-06-18 MED ORDER — SODIUM CHLORIDE 0.9 % IJ SOLN
3.0000 mL | INTRAMUSCULAR | Status: DC | PRN
  Administered 2014-06-18: 3 mL via INTRAVENOUS

## 2014-06-18 MED ORDER — MIDAZOLAM HCL 2 MG/2ML IJ SOLN
INTRAMUSCULAR | Status: AC
  Filled 2014-06-18: qty 2

## 2014-06-18 MED ORDER — CEFAZOLIN SODIUM-DEXTROSE 2-3 GM-% IV SOLR
2.0000 g | INTRAVENOUS | Status: AC
  Administered 2014-06-18: 2 g via INTRAVENOUS
  Filled 2014-06-18: qty 50

## 2014-06-18 MED ORDER — DIPHENHYDRAMINE HCL 50 MG/ML IJ SOLN: 25.0000 mg | INTRAMUSCULAR | Status: DC | PRN

## 2014-06-18 MED ORDER — KETOROLAC TROMETHAMINE 30 MG/ML IJ SOLN: 30.0000 mg | Freq: Four times a day (QID) | INTRAMUSCULAR | Status: DC | PRN

## 2014-06-18 MED ORDER — TERBUTALINE SULFATE 1 MG/ML IJ SOLN
0.2500 mg | Freq: Once | INTRAMUSCULAR | Status: AC
  Administered 2014-06-18: 0.25 mg via SUBCUTANEOUS
  Filled 2014-06-18: qty 1

## 2014-06-18 MED ORDER — MENTHOL 3 MG MT LOZG: 1.0000 | LOZENGE | OROMUCOSAL | Status: DC | PRN

## 2014-06-18 MED ORDER — SCOPOLAMINE 1 MG/3DAYS TD PT72: 1.0000 | MEDICATED_PATCH | Freq: Once | TRANSDERMAL | Status: DC

## 2014-06-18 MED ORDER — MEPERIDINE HCL 25 MG/ML IJ SOLN: 6.2500 mg | INTRAMUSCULAR | Status: DC | PRN

## 2014-06-18 MED ORDER — SIMETHICONE 80 MG PO CHEW
80.0000 mg | CHEWABLE_TABLET | ORAL | Status: DC
  Administered 2014-06-19 – 2014-06-20 (×2): 80 mg via ORAL
  Filled 2014-06-18 (×2): qty 1

## 2014-06-18 MED ORDER — OXYTOCIN 40 UNITS IN LACTATED RINGERS INFUSION - SIMPLE MED: 62.5000 mL/h | INTRAVENOUS | Status: AC

## 2014-06-18 MED ORDER — LIDOCAINE HCL (PF) 0.5 % IJ SOLN
INTRAMUSCULAR | Status: AC
  Filled 2014-06-18: qty 50

## 2014-06-18 MED ORDER — HYDROMORPHONE HCL PF 1 MG/ML IJ SOLN
INTRAMUSCULAR | Status: AC
Start: 2014-06-18 — End: 2014-06-18
  Administered 2014-06-18: 0.5 mg via INTRAVENOUS
  Filled 2014-06-18: qty 1

## 2014-06-18 MED ORDER — MAGNESIUM HYDROXIDE 400 MG/5ML PO SUSP: 30.0000 mL | ORAL | Status: DC | PRN

## 2014-06-18 MED ORDER — KETOROLAC TROMETHAMINE 30 MG/ML IJ SOLN: 15.0000 mg | Freq: Once | INTRAMUSCULAR | Status: DC | PRN

## 2014-06-18 MED ORDER — KETAMINE HCL 10 MG/ML IJ SOLN
INTRAMUSCULAR | Status: AC
Start: 2014-06-18 — End: 2014-06-18
  Filled 2014-06-18: qty 1

## 2014-06-18 MED ORDER — DIPHENHYDRAMINE HCL 25 MG PO CAPS
25.0000 mg | ORAL_CAPSULE | ORAL | Status: DC | PRN
  Filled 2014-06-18: qty 1

## 2014-06-18 MED ORDER — DIPHENHYDRAMINE HCL 12.5 MG/5ML PO ELIX: 12.5000 mg | ORAL_SOLUTION | Freq: Four times a day (QID) | ORAL | Status: DC | PRN

## 2014-06-18 MED ORDER — MORPHINE SULFATE 0.5 MG/ML IJ SOLN
INTRAMUSCULAR | Status: AC
  Filled 2014-06-18: qty 10

## 2014-06-18 MED ORDER — NALBUPHINE HCL 10 MG/ML IJ SOLN: 5.0000 mg | INTRAMUSCULAR | Status: DC | PRN

## 2014-06-18 MED ORDER — CITRIC ACID-SODIUM CITRATE 334-500 MG/5ML PO SOLN
ORAL | Status: AC
  Filled 2014-06-18: qty 15

## 2014-06-18 MED ORDER — SODIUM CHLORIDE 0.9 % IJ SOLN: 3.0000 mL | INTRAMUSCULAR | Status: DC | PRN

## 2014-06-18 MED ORDER — ONDANSETRON HCL 4 MG/2ML IJ SOLN: 4.0000 mg | Freq: Four times a day (QID) | INTRAMUSCULAR | Status: DC | PRN

## 2014-06-18 MED ORDER — IBUPROFEN 600 MG PO TABS: 600.0000 mg | ORAL_TABLET | Freq: Four times a day (QID) | ORAL | Status: DC | PRN

## 2014-06-18 MED ORDER — PNEUMOCOCCAL VAC POLYVALENT 25 MCG/0.5ML IJ INJ
0.5000 mL | INJECTION | INTRAMUSCULAR | Status: AC
  Administered 2014-06-21: 0.5 mL via INTRAMUSCULAR
  Filled 2014-06-18: qty 0.5

## 2014-06-18 MED ORDER — ONDANSETRON HCL 4 MG PO TABS: 4.0000 mg | ORAL_TABLET | ORAL | Status: DC | PRN

## 2014-06-18 MED ORDER — CITRIC ACID-SODIUM CITRATE 334-500 MG/5ML PO SOLN
30.0000 mL | Freq: Once | ORAL | Status: AC
  Administered 2014-06-18: 30 mL via ORAL

## 2014-06-18 MED ORDER — FENTANYL CITRATE 0.05 MG/ML IJ SOLN
INTRAMUSCULAR | Status: AC
  Filled 2014-06-18: qty 2

## 2014-06-18 MED ORDER — ONDANSETRON HCL 4 MG/2ML IJ SOLN: 4.0000 mg | INTRAMUSCULAR | Status: DC | PRN

## 2014-06-18 MED ORDER — HYDROMORPHONE HCL PF 1 MG/ML IJ SOLN
0.2500 mg | INTRAMUSCULAR | Status: DC | PRN
  Administered 2014-06-18 (×4): 0.5 mg via INTRAVENOUS

## 2014-06-18 MED ORDER — FENTANYL CITRATE 0.05 MG/ML IJ SOLN
INTRAMUSCULAR | Status: DC | PRN
  Administered 2014-06-18 (×2): 100 ug via INTRAVENOUS

## 2014-06-18 MED ORDER — KETAMINE HCL 10 MG/ML IJ SOLN
INTRAMUSCULAR | Status: DC | PRN
  Administered 2014-06-18 (×4): 20 mg via INTRAVENOUS
  Administered 2014-06-18: 30 mg via INTRAVENOUS
  Administered 2014-06-18 (×2): 20 mg via INTRAVENOUS

## 2014-06-18 MED ORDER — SODIUM CHLORIDE 0.9 % IJ SOLN
3.0000 mL | Freq: Two times a day (BID) | INTRAMUSCULAR | Status: DC
  Administered 2014-06-18: 3 mL via INTRAVENOUS

## 2014-06-18 MED ORDER — PRENATAL MULTIVITAMIN CH
1.0000 | ORAL_TABLET | Freq: Every day | ORAL | Status: DC
  Administered 2014-06-19 – 2014-06-22 (×4): 1 via ORAL
  Filled 2014-06-18 (×4): qty 1

## 2014-06-18 MED ORDER — MIDAZOLAM HCL 2 MG/2ML IJ SOLN
INTRAMUSCULAR | Status: DC | PRN
  Administered 2014-06-18 (×2): 1 mg via INTRAVENOUS

## 2014-06-18 MED ORDER — BUTORPHANOL TARTRATE 1 MG/ML IJ SOLN
2.0000 mg | INTRAMUSCULAR | Status: DC | PRN
  Administered 2014-06-18: 2 mg via INTRAVENOUS
  Filled 2014-06-18: qty 2

## 2014-06-18 MED ORDER — PROMETHAZINE HCL 25 MG/ML IJ SOLN: 6.2500 mg | INTRAMUSCULAR | Status: DC | PRN

## 2014-06-18 MED ORDER — OXYCODONE-ACETAMINOPHEN 5-325 MG PO TABS
1.0000 | ORAL_TABLET | ORAL | Status: DC | PRN
  Administered 2014-06-19 (×4): 1 via ORAL
  Administered 2014-06-20: 2 via ORAL
  Administered 2014-06-20 (×2): 1 via ORAL
  Administered 2014-06-20 – 2014-06-22 (×6): 2 via ORAL
  Administered 2014-06-22: 1 via ORAL
  Administered 2014-06-22: 2 via ORAL
  Filled 2014-06-18: qty 1
  Filled 2014-06-18 (×2): qty 2
  Filled 2014-06-18: qty 1
  Filled 2014-06-18: qty 2
  Filled 2014-06-18: qty 1
  Filled 2014-06-18 (×3): qty 2
  Filled 2014-06-18: qty 1
  Filled 2014-06-18 (×2): qty 2
  Filled 2014-06-18: qty 1
  Filled 2014-06-18: qty 2
  Filled 2014-06-18: qty 1

## 2014-06-18 MED ORDER — KETOROLAC TROMETHAMINE 30 MG/ML IJ SOLN
30.0000 mg | Freq: Four times a day (QID) | INTRAMUSCULAR | Status: DC
  Administered 2014-06-18: 30 mg via INTRAVENOUS
  Filled 2014-06-18: qty 1

## 2014-06-18 MED ORDER — DIBUCAINE 1 % RE OINT: 1.0000 "application " | TOPICAL_OINTMENT | RECTAL | Status: DC | PRN

## 2014-06-18 MED ORDER — MAGNESIUM SULFATE 40 G IN LACTATED RINGERS - SIMPLE
3.0000 g/h | INTRAVENOUS | Status: DC
Start: 2014-06-18 — End: 2014-06-18
  Filled 2014-06-18: qty 500

## 2014-06-18 MED ORDER — OXYTOCIN 10 UNIT/ML IJ SOLN
40.0000 [IU] | INTRAVENOUS | Status: DC | PRN
  Administered 2014-06-18: 40 [IU] via INTRAVENOUS

## 2014-06-18 MED ORDER — HYDROMORPHONE HCL PF 1 MG/ML IJ SOLN
1.0000 mg | Freq: Once | INTRAMUSCULAR | Status: AC
  Administered 2014-06-18: 1 mg via INTRAVENOUS
  Filled 2014-06-18: qty 1

## 2014-06-18 MED ORDER — METHADONE HCL 10 MG/ML PO CONC
90.0000 mg | ORAL | Status: DC
  Administered 2014-06-19 – 2014-06-22 (×4): 90 mg via ORAL
  Filled 2014-06-18 (×5): qty 9

## 2014-06-18 MED ORDER — DIPHENHYDRAMINE HCL 50 MG/ML IJ SOLN: 12.5000 mg | INTRAMUSCULAR | Status: DC | PRN

## 2014-06-18 MED ORDER — LANOLIN HYDROUS EX OINT: 1.0000 "application " | TOPICAL_OINTMENT | CUTANEOUS | Status: DC | PRN

## 2014-06-18 MED ORDER — BUPIVACAINE IN DEXTROSE 0.75-8.25 % IT SOLN
INTRATHECAL | Status: DC | PRN
  Administered 2014-06-18: 11 mg via INTRATHECAL

## 2014-06-18 MED ORDER — IBUPROFEN 600 MG PO TABS
600.0000 mg | ORAL_TABLET | Freq: Four times a day (QID) | ORAL | Status: DC
  Administered 2014-06-19 – 2014-06-22 (×14): 600 mg via ORAL
  Filled 2014-06-18 (×14): qty 1

## 2014-06-18 MED ORDER — HYDROMORPHONE HCL PF 1 MG/ML IJ SOLN
INTRAMUSCULAR | Status: AC
  Administered 2014-06-18: 0.5 mg via INTRAVENOUS
  Filled 2014-06-18: qty 1

## 2014-06-18 MED ORDER — MAGNESIUM SULFATE BOLUS VIA INFUSION
4.0000 g | Freq: Once | INTRAVENOUS | Status: AC
  Administered 2014-06-18: 4 g via INTRAVENOUS
  Filled 2014-06-18: qty 500

## 2014-06-18 MED ORDER — MEASLES, MUMPS & RUBELLA VAC ~~LOC~~ INJ: 0.5000 mL | INJECTION | Freq: Once | SUBCUTANEOUS | Status: DC

## 2014-06-18 MED ORDER — MIDAZOLAM HCL 2 MG/2ML IJ SOLN: 0.5000 mg | Freq: Once | INTRAMUSCULAR | Status: DC | PRN

## 2014-06-18 MED ORDER — HYDROMORPHONE 0.3 MG/ML IV SOLN
INTRAVENOUS | Status: DC
  Administered 2014-06-18: 19:00:00 via INTRAVENOUS
  Administered 2014-06-18: 1.8 mg via INTRAVENOUS
  Administered 2014-06-19: 2.4 mg via INTRAVENOUS
  Administered 2014-06-19: 0.3 mg via INTRAVENOUS
  Filled 2014-06-18: qty 25

## 2014-06-18 MED ORDER — ACETAMINOPHEN 500 MG PO TABS: 1000.0000 mg | ORAL_TABLET | Freq: Four times a day (QID) | ORAL | Status: DC

## 2014-06-18 MED ORDER — LACTATED RINGERS IV SOLN
INTRAVENOUS | Status: DC | PRN
  Administered 2014-06-18: 17:00:00 via INTRAVENOUS

## 2014-06-18 MED ORDER — SIMETHICONE 80 MG PO CHEW
80.0000 mg | CHEWABLE_TABLET | Freq: Three times a day (TID) | ORAL | Status: DC
  Administered 2014-06-19 – 2014-06-22 (×13): 80 mg via ORAL
  Filled 2014-06-18 (×8): qty 1

## 2014-06-18 MED ORDER — SODIUM CHLORIDE 0.9 % IJ SOLN: 9.0000 mL | INTRAMUSCULAR | Status: DC | PRN

## 2014-06-18 MED ORDER — LACTATED RINGERS IV SOLN
INTRAVENOUS | Status: DC
  Administered 2014-06-19: 03:00:00 via INTRAVENOUS

## 2014-06-18 MED ORDER — HYDROMORPHONE HCL PF 1 MG/ML IJ SOLN
1.0000 mg | INTRAMUSCULAR | Status: DC | PRN
  Administered 2014-06-18: 2 mg via INTRAVENOUS
  Filled 2014-06-18: qty 2

## 2014-06-18 MED ORDER — ONDANSETRON HCL 4 MG/2ML IJ SOLN: 4.0000 mg | Freq: Three times a day (TID) | INTRAMUSCULAR | Status: DC | PRN

## 2014-06-18 MED ORDER — ONDANSETRON HCL 4 MG/2ML IJ SOLN
INTRAMUSCULAR | Status: DC | PRN
  Administered 2014-06-18: 4 mg via INTRAVENOUS

## 2014-06-18 MED ORDER — DEXTROSE 5 % IV SOLN
1.0000 ug/kg/h | INTRAVENOUS | Status: DC | PRN
  Filled 2014-06-18: qty 2

## 2014-06-18 MED ORDER — WITCH HAZEL-GLYCERIN EX PADS: 1.0000 "application " | MEDICATED_PAD | CUTANEOUS | Status: DC | PRN

## 2014-06-18 SURGICAL SUPPLY — 32 items
APL SKNCLS STERI-STRIP NONHPOA (GAUZE/BANDAGES/DRESSINGS) ×1
BARRIER ADHS 3X4 INTERCEED (GAUZE/BANDAGES/DRESSINGS) IMPLANT
BENZOIN TINCTURE PRP APPL 2/3 (GAUZE/BANDAGES/DRESSINGS) ×2 IMPLANT
BRR ADH 4X3 ABS CNTRL BYND (GAUZE/BANDAGES/DRESSINGS)
CLAMP CORD UMBIL (MISCELLANEOUS) IMPLANT
CLOSURE WOUND 1/2 X4 (GAUZE/BANDAGES/DRESSINGS) ×1
CLOTH BEACON ORANGE TIMEOUT ST (SAFETY) ×3 IMPLANT
DRAPE LG THREE QUARTER DISP (DRAPES) IMPLANT
DRSG OPSITE POSTOP 4X10 (GAUZE/BANDAGES/DRESSINGS) ×3 IMPLANT
DURAPREP 26ML APPLICATOR (WOUND CARE) ×3 IMPLANT
ELECT REM PT RETURN 9FT ADLT (ELECTROSURGICAL) ×3
ELECTRODE REM PT RTRN 9FT ADLT (ELECTROSURGICAL) ×1 IMPLANT
EXTRACTOR VACUUM KIWI (MISCELLANEOUS) IMPLANT
GLOVE BIO SURGEON STRL SZ 6.5 (GLOVE) ×2 IMPLANT
GLOVE BIO SURGEONS STRL SZ 6.5 (GLOVE) ×1
GLOVE BIOGEL PI IND STRL 7.0 (GLOVE) ×1 IMPLANT
GLOVE BIOGEL PI INDICATOR 7.0 (GLOVE) ×2
GOWN STRL REUS W/TWL LRG LVL3 (GOWN DISPOSABLE) ×6 IMPLANT
KIT ABG SYR 3ML LUER SLIP (SYRINGE) IMPLANT
NDL HYPO 25X5/8 SAFETYGLIDE (NEEDLE) IMPLANT
NEEDLE HYPO 25X5/8 SAFETYGLIDE (NEEDLE) IMPLANT
NS IRRIG 1000ML POUR BTL (IV SOLUTION) ×3 IMPLANT
PACK C SECTION WH (CUSTOM PROCEDURE TRAY) ×3 IMPLANT
PAD OB MATERNITY 4.3X12.25 (PERSONAL CARE ITEMS) ×3 IMPLANT
STRIP CLOSURE SKIN 1/2X4 (GAUZE/BANDAGES/DRESSINGS) ×1 IMPLANT
SUT VIC AB 0 CT1 36 (SUTURE) ×18 IMPLANT
SUT VIC AB 2-0 CT1 27 (SUTURE) ×3
SUT VIC AB 2-0 CT1 TAPERPNT 27 (SUTURE) ×1 IMPLANT
SUT VIC AB 4-0 PS2 27 (SUTURE) ×3 IMPLANT
TOWEL OR 17X24 6PK STRL BLUE (TOWEL DISPOSABLE) ×3 IMPLANT
TRAY FOLEY CATH 14FR (SET/KITS/TRAYS/PACK) IMPLANT
WATER STERILE IRR 1000ML POUR (IV SOLUTION) ×3 IMPLANT

## 2014-06-18 NOTE — Progress Notes (Signed)
UR completed 

## 2014-06-18 NOTE — Transfer of Care (Signed)
Immediate Anesthesia Transfer of Care Note  Patient: Briana Zhang  Procedure(s) Performed: Procedure(s): CESAREAN SECTION (N/A)  Patient Location: PACU  Anesthesia Type:Spinal  Level of Consciousness: awake, alert  and oriented  Airway & Oxygen Therapy: Patient Spontanous Breathing and Patient connected to nasal cannula oxygen  Post-op Assessment: Report given to PACU RN and Post -op Vital signs reviewed and stable  Post vital signs: Reviewed and stable  Complications: No apparent anesthesia complications

## 2014-06-18 NOTE — Progress Notes (Signed)
Patient admitted to using a little heroin and little cocaine last night around 8 pm.

## 2014-06-18 NOTE — Progress Notes (Signed)
Briana Zhang is a 33 y.o. [id number] at 7012w3d admitted for threatened PTL and polysubstance abuse.    Subjective:  Pt has continued contracting despite magnesium today. Contractions are now getting more frequent and stronger again despite a 6gm mag bolus and 3mg /hr now.     Objective: BP 113/69  Pulse 70  Temp(Src) 98.5 F (36.9 C) (Oral)  Resp 20  Ht 5\' 1"  (1.549 m)  Wt 54.84 kg (120 lb 14.4 oz)  BMI 22.86 kg/m2  SpO2 100%  LMP 09/06/2013 I/O last 3 completed shifts: In: 2799.2 [P.O.:1320; I.V.:1479.2] Out: 2750 [Urine:2750] Total I/O In: 394 [I.V.:394] Out: 450 [Urine:450]  FHT:  FHR: 140 bpm, variability: moderate,  accelerations:  Present,  decelerations:  Present occasional variables UC:   regular, every 4-6 minutes SVE:   Dilation: 4 Effacement (%): 90 Station: 0 Exam by:: Dr. Reola CalkinsBeck  Labs: Lab Results  Component Value Date   WBC 10.4 06/14/2014   HGB 11.8* 06/14/2014   HCT 36.1 06/14/2014   MCV 81.3 06/14/2014   PLT 339 06/14/2014    Assessment / Plan: Preterm labor  Continued cervical change despite mag 6/3 and worsening pain with ctx.  Previous c/s x2. Discussed with pt need for repeat and she agrees. NICU notified  The risks of cesarean section discussed with the patient included but were not limited to: bleeding which may require transfusion or reoperation; infection which may require antibiotics; injury to bowel, bladder, ureters or other surrounding organs; injury to the fetus; need for additional procedures including hysterectomy in the event of a life-threatening hemorrhage; placental abnormalities wth subsequent pregnancies, incisional problems, thromboembolic phenomenon and other postoperative/anesthesia complications. The patient concurred with the proposed plan, giving informed written consent for the procedure.   Patient has been NPO since midnight she will remain NPO for procedure. Anesthesia and OR aware. Preoperative prophylactic antibiotics and SCDs  ordered on call to the OR.  To OR when ready.   BECK, KELI L 06/18/2014, 3:25 PM

## 2014-06-18 NOTE — Op Note (Signed)
Briana RussianAshley K Zhang PROCEDURE DATE: 06/14/2014 - 06/18/2014  PREOPERATIVE DIAGNOSES: Intrauterine pregnancy at  2112w3d weeks gestation; preterm labor; previous cesarean section x2  POSTOPERATIVE DIAGNOSES: The same  PROCEDURE: Repeat Low Transverse Cesarean Section  SURGEON:  Dr. Scheryl DarterJames Arnold  ASSISTANT:  Dr. Rulon AbideKeli Beck  ANESTHESIOLOGIST: Dr. Brayton CavesFreeman Jackson  INDICATIONS: Briana Zhang is a 33 y.o. [phone] at 4612w3d here for cesarean section secondary to the indications listed under preoperative diagnoses; please see preoperative note for further details.  The risks of cesarean section were discussed with the patient including but were not limited to: bleeding which may require transfusion or reoperation; infection which may require antibiotics; injury to bowel, bladder, ureters or other surrounding organs; injury to the fetus; need for additional procedures including hysterectomy in the event of a life-threatening hemorrhage; placental abnormalities wth subsequent pregnancies, incisional problems, thromboembolic phenomenon and other postoperative/anesthesia complications.   The patient concurred with the proposed plan, giving informed written consent for the procedure.    FINDINGS:  Viable female infant in cephalic presentation.  Apgars 9 and 9.  Clear amniotic fluid.  Intact placenta, three vessel cord.  Appearance of large placental infarct.  Bladder adhesions. Normal uterus, fallopian tubes and ovaries bilaterally.  ANESTHESIA: Spinal INTRAVENOUS FLUIDS: 1500 ml ESTIMATED BLOOD LOSS: 400 ml URINE OUTPUT:  1550 ml SPECIMENS: Placenta sent to pathology COMPLICATIONS: None immediate. Likely previous placental infarct.   PROCEDURE IN DETAIL:  The patient preoperatively received intravenous antibiotics and had sequential compression devices applied to her lower extremities.  She was then taken to the operating room where spinal anesthesia was administered and was found to be adequate. She was then  placed in a dorsal supine position with a leftward tilt, and prepped and draped in a sterile manner.  A foley catheter was placed into her bladder and attached to constant gravity.  After an adequate timeout was performed, a Pfannenstiel skin incision was made with scalpel and carried through to the underlying layer of fascia. The fascia was incised in the midline, and this incision was extended bilaterally using the Mayo scissors.  Kocher clamps were applied to the superior aspect of the fascial incision and the underlying rectus muscles were dissected off bluntly. A similar process was carried out on the inferior aspect of the fascial incision. The rectus muscles were separated in the midline bluntly and the peritoneum was entered bluntly. Several bladder adhesions were taken down by creating a bladder flap with metzenbaum scissors.  Attention was turned to the lower uterine segment where a low transverse hysterotomy was made with a scalpel and extended bilaterally bluntly.  The infant was successfully delivered, the cord was clamped and cut and the infant was handed over to awaiting neonatology team. Uterine massage was then administered, and the placenta delivered intact with a three-vessel cord. The uterus was then cleared of clot and debris.  Patient became violently ill at this point with severe vomiting and complaining of pain which was treated by anesthesia with phenergan, fentanyl, nitrous oxide, and ketamine. The hysterotomy was closed with 0 Vicryl in a running locked fashion, and an imbricating layer was also placed with 0 Vicryl.   The pelvis was cleared of all clot and debris. Hemostasis was confirmed on all surfaces.  The peritoneum and the muscles were reapproximated using 0 Vicryl running stitches. The fascia was then closed using 0 Vicryl in a running fashion.  The subcutaneous layer was irrigated and 30 ml of 0.5% Marcaine was injected subcutaneously around the incision.  The skin was closed with  a 4-0 Vicryl subcuticular stitch. The patient tolerated the procedure well after being medicated further as above. Sponge, lap, instrument and needle counts were correct x 2.  She was taken to the recovery room in stable condition.   BECK, Redmond BasemanKELI L, MD

## 2014-06-18 NOTE — Anesthesia Preprocedure Evaluation (Addendum)
Anesthesia Evaluation  Patient identified by MRN, date of birth, ID band Patient awake    Reviewed: Allergy & Precautions, H&P , NPO status , Patient's Chart, lab work & pertinent test results  Airway Mallampati: II      Dental   Pulmonary Current Smoker,  breath sounds clear to auscultation        Cardiovascular Exercise Tolerance: Good Rhythm:regular Rate:Normal     Neuro/Psych    GI/Hepatic (+) Hepatitis -, C  Endo/Other    Renal/GU      Musculoskeletal   Abdominal   Peds  Hematology   Anesthesia Other Findings Bipolar 1 disorder     Paranoid schizophrenia        Cellulitis and abscess     Substance abuse    Reproductive/Obstetrics (+) Pregnancy                         Anesthesia Physical Anesthesia Plan  ASA: III  Anesthesia Plan: Spinal   Post-op Pain Management:    Induction:   Airway Management Planned:   Additional Equipment:   Intra-op Plan:   Post-operative Plan:   Informed Consent: I have reviewed the patients History and Physical, chart, labs and discussed the procedure including the risks, benefits and alternatives for the proposed anesthesia with the patient or authorized representative who has indicated his/her understanding and acceptance.     Plan Discussed with: Anesthesiologist, CRNA and Surgeon  Anesthesia Plan Comments:        Anesthesia Quick Evaluation

## 2014-06-18 NOTE — Anesthesia Procedure Notes (Signed)
Spinal  Patient location during procedure: OR Start time: 06/18/2014 4:11 PM Staffing Anesthesiologist: Brayton CavesJACKSON, FREEMAN Performed by: anesthesiologist  Preanesthetic Checklist Completed: patient identified, site marked, surgical consent, pre-op evaluation, timeout performed, IV checked, risks and benefits discussed and monitors and equipment checked Spinal Block Patient position: sitting Prep: DuraPrep Patient monitoring: heart rate, cardiac monitor, continuous pulse ox and blood pressure Approach: midline Location: L3-4 Injection technique: single-shot Needle Needle type: Sprotte  Needle gauge: 24 G Needle length: 9 cm Assessment Sensory level: T4 Additional Notes Patient identified.  Risk benefits discussed including failed block, incomplete pain control, headache, nerve damage, paralysis, blood pressure changes, nausea, vomiting, reactions to medication both toxic or allergic, and postpartum back pain.  Patient expressed understanding and wished to proceed.  All questions were answered.  Sterile technique used throughout procedure.  CSF was clear.  No parasthesia or other complications.  Please see nursing notes for vital signs.

## 2014-06-18 NOTE — Progress Notes (Signed)
Patient ID: Briana Zhang, female   DOB: 04/12/1981, 33 y.o.   MRN: [id number] FACULTY PRACTICE ANTEPARTUM(COMPREHENSIVE) NOTE  Briana Zhang is a 33 y.o. [id number] at 4243w3d by best clinical estimate who is admitted for Preterm labor.   Fetal presentation is cephalic. Length of Stay:  4  Days  Subjective:  Patient reports the fetal movement as active. Patient reports uterine contraction  activity as irregular, every 3-5 minutes. Patient reports  vaginal bleeding as none. Patient describes fluid per vagina as None.  Vitals:  Blood pressure 119/75, pulse 66, temperature 98 F (36.7 C), temperature source Oral, resp. rate 18, height 5\' 1"  (1.549 m), weight 120 lb 14.4 oz (54.84 kg), last menstrual period 09/06/2013, SpO2 100.00%. Physical Examination:  General appearance - alert, well appearing, and in no distress and in mild to moderate distress Heart - normal rate and regular rhythm Abdomen - soft, nontender, nondistended Fundal Height:  size equals dates Cervical Exam: Evaluated by digital exam., Position: mid position, Dilation: 3cm, Thickness: paper thin and Consistency: soft and found to be paper thin/ 100%/-2 and fetal presentation is cephalic. Extremities: extremities normal, atraumatic Membranes:intact  Fetal Monitoring:  Baseline: 145 bpm, Variability: Good {> 6 bpm), Accelerations: Reactive and Decelerations: Absent  Labs:  Results for orders placed during the hospital encounter of 06/14/14 (from the past 24 hour(s))  TYPE AND SCREEN   Collection Time    06/17/14  9:00 PM      Result Value Ref Range   ABO/RH(D) O POS     Antibody Screen NEG     Sample Expiration 06/20/2014      Imaging Studies:      Currently EPIC will not allow sonographic studies to automatically populate into notes.  In the meantime, copy and paste results into note or free text.  Medications:  Scheduled . ampicillin  500 mg Oral 4 times per day  . docusate sodium  100 mg Oral Daily  . magnesium   4 g Intravenous Once  . methadone  90 mg Oral Q24H  . NIFEdipine  30 mg Oral BID  . pantoprazole  40 mg Oral Daily  . polyethylene glycol  17 g Oral Daily  . prenatal multivitamin  1 tablet Oral Q1200  . sodium chloride  3 mL Intravenous Q12H   I have reviewed the patient's current medications.  ASSESSMENT: Patient Active Problem List   Diagnosis Date Noted  . Placental abruption in third trimester 06/14/2014  . Supervision of high risk pregnancy due to social problems in third trimester 05/27/2014  . 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 Urine drug screen.  ARNOLD,JAMES 06/18/2014,10:54 AM

## 2014-06-18 NOTE — Anesthesia Postprocedure Evaluation (Signed)
Anesthesia Post Note  Patient: Briana Zhang  Procedure(s) Performed: Procedure(s) (LRB): CESAREAN SECTION (N/A)  Anesthesia type: Spinal  Patient location: PACU  Post pain: Pain level controlled  Post assessment: Post-op Vital signs reviewed  Last Vitals:  Filed Vitals:   06/18/14 1815  BP: 131/86  Pulse: 64  Temp:   Resp: 20    Post vital signs: Reviewed  Level of consciousness: awake  Complications: No apparent anesthesia complications

## 2014-06-19 ENCOUNTER — Encounter (HOSPITAL_COMMUNITY): Payer: Self-pay | Admitting: *Deleted

## 2014-06-19 LAB — CBC
HCT: 35 % — ABNORMAL LOW (ref 36.0–46.0)
HEMOGLOBIN: 11.6 g/dL — AB (ref 12.0–15.0)
MCH: 27 pg (ref 26.0–34.0)
MCHC: 33.1 g/dL (ref 30.0–36.0)
MCV: 81.4 fL (ref 78.0–100.0)
Platelets: 286 10*3/uL (ref 150–400)
RBC: 4.3 MIL/uL (ref 3.87–5.11)
RDW: 14 % (ref 11.5–15.5)
WBC: 13.6 10*3/uL — ABNORMAL HIGH (ref 4.0–10.5)

## 2014-06-19 LAB — DRUGS OF ABUSE SCREEN W/O ALC, ROUTINE URINE
Amphetamine Screen, Ur: NEGATIVE
Barbiturate Quant, Ur: NEGATIVE
Benzodiazepines.: NEGATIVE
CREATININE, U: 27 mg/dL
Cocaine Metabolites: POSITIVE — AB
MARIJUANA METABOLITE: NEGATIVE
METHADONE: POSITIVE — AB
OPIATE SCREEN, URINE: POSITIVE — AB
PROPOXYPHENE: NEGATIVE
Phencyclidine (PCP): NEGATIVE

## 2014-06-19 NOTE — Anesthesia Postprocedure Evaluation (Signed)
Anesthesia Post Note  Patient: Briana Zhang  Procedure(s) Performed: Procedure(s) (LRB): CESAREAN SECTION (N/A)  Anesthesia type: Spinal  Patient location: Mother/Baby  Post pain: Pain level controlled  Post assessment: Post-op Vital signs reviewed  Last Vitals:  Filed Vitals:   06/19/14 0537  BP: 111/73  Pulse: 71  Temp: 37.1 C  Resp: 16    Post vital signs: Reviewed  Level of consciousness: awake  Complications: No apparent anesthesia complications

## 2014-06-19 NOTE — Addendum Note (Signed)
Addendum created 06/19/14 0756 by Algis GreenhouseLinda A Burger, CRNA   Modules edited: Notes Section   Notes Section:  File: [id number]

## 2014-06-19 NOTE — Progress Notes (Signed)
Subjective:good pain relief. Expressed interest in inpatient substance abuse therapy. Admitted to use of heroin in hospital Postpartum Day 1: Cesarean Delivery Patient reports incisional pain and tolerating PO.    Objective: Vital signs in last 24 hours: Temp:  [97.8 F (36.6 C)-99.1 F (37.3 C)] 98.8 F (37.1 C) (06/26 0537) Pulse Rate:  [55-80] 71 (06/26 0537) Resp:  [15-27] 16 (06/26 0537) BP: (110-131)/(61-96) 111/73 mmHg (06/26 0537) SpO2:  [92 %-100 %] 92 % (06/26 0537)  Physical Exam:  General: alert, cooperative and no distress Lochia: appropriate Uterine Fundus: firm Incision: healing well, no significant drainage DVT Evaluation: No evidence of DVT seen on physical exam.   Recent Labs  06/18/14 1548  HGB 12.2  HCT 37.4    Assessment/Plan: Status post Cesarean section. Postoperative course complicated by opioid abuse  SW [facility].  ARNOLD,JAMES 06/19/2014, 6:05 AM

## 2014-06-19 NOTE — Progress Notes (Signed)
Entered pt room, accompanied by CNA, to assess pt. CNA was assisting pt to ambulate. This RN noticed a bloody napkin lying in the pts bed. I carefully picked up the bloody napkin and felt it had something hard in it. I carefully unwrapped the napkin to find an open, uncapped, empty, syringe. I asked the pt where the needle came from and initially she stated she did not know what I was talking about. I stated that this was in your bed beside you, where did this come from and what were you using it for? I did observe a female visitor, visiting the pt, at approximately [id number]. At this time the pt was requesting her belongings from L&D. The female visitor did not stay very long.  I asked the pt if this female visitor brought any needles or heroin in the room and the pt denied that the female visitor brought anything.  Pt then stated it came from her belongings and it had a residue wash in it. When I asked the pt to explain what a residue wash was she stated it was heroin. I asked the pt when she last used the heroin and where she used it. The pt states that she used it after 7 p.m. In her right forearm. I looked at the pts arm at this time to find a small pin point size open area where the pt states she injected the residue wash/heroin. Pt was educated at this time of the dangers of open needle and using heroin in combination with medications that she is currently taking while in the hospital. House coverage was notified at this point for further evaluation of the situation. Dr. Debroah LoopArnold notified at this time of pt situation and made aware of pt O2 sats dropping to 89% while in the room. New orders obtained at this time to stop PCA.

## 2014-06-19 NOTE — Progress Notes (Signed)
I was called to pt room by RN Carmelina DaneJill Hamm who noted pt holding something in hand as though to hide it. Noreene LarssonJill asked about it and while trying to awaken the pt, noted wadded-up bloody tissue and syringe in bed. I asked Ofcr. Ammie FerrierLaura Pearson to accompany me to room, where I asked pt what was happening. She was very cooperative, talked about "just like to see the blood..it''s a needle thing". I asked where the rest of the drugs were and she said "over there" pointing to her bags and belongings. I handed her her purse; she withdrew 4 capped used syringes and a tablespoon with significant grayish-yellow residue and solid matter. Of note, there was a cap without a syringe, but pt claims there were no more drugs. She has several other belonging bags in room, but stated "nothing in those" and I left them alone. L Pearson, security, and I inventoried the items and discarded of them. I cautioned pt to be careful for herself and for the sake of the staff, pt agreed.

## 2014-06-20 ENCOUNTER — Encounter (HOSPITAL_COMMUNITY): Payer: Self-pay | Admitting: Obstetrics & Gynecology

## 2014-06-20 LAB — RPR

## 2014-06-20 NOTE — Progress Notes (Signed)
Subjective: Postpartum Day 1: Cesarean Delivery Patient reports no complaints.    Objective: Vital signs in last 24 hours: Temp:  [97.7 F (36.5 C)-98.6 F (37 C)] 97.7 F (36.5 C) (06/27 0556) Pulse Rate:  [54-74] 54 (06/27 0556) Resp:  [16-18] 16 (06/27 0556) BP: (93-138)/(53-86) 138/86 mmHg (06/27 0556) SpO2:  [92 %-100 %] 100 % (06/27 0556)  Physical Exam:  General: alert, cooperative and no distress Lochia: appropriate Uterine Fundus: firm Incision: clean dry intact DVT Evaluation: No evidence of DVT seen on physical exam.   Recent Labs  06/18/14 1548 06/19/14 0601  HGB 12.2 11.6*  HCT 37.4 35.0*    Assessment/Plan: Status post Cesarean section. Doing well postoperatively.  Plan to keep until Monday due to inpatient detox arrangements  Briana Zhang,Briana Zhang 06/20/2014, 8:00 AM

## 2014-06-20 NOTE — Progress Notes (Signed)
Clinical Social Work Department PSYCHOSOCIAL ASSESSMENT - MATERNAL/CHILD 06/20/2014  Patient:  Briana Zhang, Briana Zhang  Account Number:  [id number]  [place] Date:  06/14/2014  Ardine Eng Name:   Briana Zhang    Clinical Social Worker:  Terri Piedra, LCSW   Date/Time:  06/20/2014 02:00 PM  Date Referred:        Other referral source:   This was a follow up visit and new assessment now that baby has been born    I:  FAMILY / HOME ENVIRONMENT Child's legal guardian:  PARENT  Guardian - Name Guardian - Age Guardian - Address  Briana Zhang 32 [address] [place], [place] [address]   Other household support members/support persons Name Relationship DOB  Briana Zhang FRIEND    Other support:   MOB states her family cares for her two older daughters, ages 17 and 5, but are not involved in her life at this time.  MOB states she has friends who are her support system.    II  PSYCHOSOCIAL DATA Information Source:  Patient Interview  Occupational hygienist Employment:   Financial resources:  [employer] - County:  [employer] [employer] / Grade:   Maternity Care Coordinator / Child Services Coordination / Early Interventions:  Cultural issues impacting care:   None stated    III  STRENGTHS Strengths  Understanding of illness  Other - See comment   Strength comment:  MOB states a strong desire for inpatient substance abuse treatment in order to get off of drugs.   IV  RISK FACTORS AND CURRENT PROBLEMS Current Problem:  YES   Risk Factor & Current Problem Patient Issue Family Issue Risk Factor / Current Problem Comment  Substance Abuse Y N Polysubstance abuse  Mental Illness Y N Bipolar, Schizophrenia   N N     V  SOCIAL WORK ASSESSMENT  CSW met with MOB in her third floor room/306 to follow up now that baby has been born.  CSW initially met MOB earlier this week while she was a patient on Antenatal.  MOB was pleasant and welcoming as usual and willing to talk  with CSW.  She states she is feeling okay, although in some pain from the c/section.  She remains interested in inpatient treatment at [name].  CSW explained that, unfortunately, since she is no longer pregnant, she may not be at the top of the waiting list once CSW is able to make the referral.  CSW explained that there was no way to avoid this, but wanted to make sure she understood.  She stated understanding.  CSW explained that CSW cannot make the referral until documentation from [facility] is received.  MOB provided CSW with the consent form and CSW faxed it to [facility] today, however, MOB has already signed consent between [place] prior to CSW's request for information.  CSW informed MOB that since she will most likely be discharged prior to [place] receiving the information, it may be her counselor who needs to make the referral, but that CSW will do everything possible to help and support her in getting into treatment.  CSW asked her to keep in touch with CSW so that CSW can continue to assist her.  CSW asked about reports from staff that she has been using drugs while inpatient at [facility].  MOB states that no one believes her, but that she has not actually been using drugs, but shooting old syringes she had with her for  the residue and for the act of shooting up.  She states there is a rush that she gets just from watching the blood from her vein come up into the needle.  (CSW did not push this issue, but later spoke with Dr. Louanne Skye fellow, who states MOB was unresponsive in the bed when the needle was found in bed with her.  CSW also checked drug screen completed on 06/18/14-4 days after admission-which was positive for Cocaine.  This drug would likely be out of a person's system in this period of time.)  CSW informed MOB that CPS will be involved given her drug use.  MOB was understanding and was not surprised.  CSW asked what her plan is for the baby, and she replied,  "I just want what's best for her."  CSW stated that she could talk with CSW about anything, whether that be adoption, which MOB brought up very briefly earlier this week, the posibility of a friend or family member caring for infant at discharge, or the posibility of baby going to a treatment center if MOB has been successful in treatment for a period of time.  MOB had no further thoughts about a plan at this time.  CSW encouraged her to visit the baby if she wishes to be involved and states that the guilt she may be feeling can be a motivation for her to get on the right track instead of feeling shameful.  CSW feels that she was able to speak openly about her emotions surrounding her feelings of guilt and that she was encouraged by this conversation.  CSW asked MOB to call for support at any time.  MOB was appreciative.  CSW made report to CPS.  Case was assigned to S. Bookman who called CSW to state that she planned to come to the hospital this afternoon to meet with MOB and see baby.  CSW let MOB and staff know to expect CPS worker's visit.  CSW will continue to follow closely.      VI SOCIAL WORK PLAN Social Work Plan  Psychosocial Support/Ongoing Assessment of Needs  Patient/Family Education  Child Protective Services Report   Type of pt/family education:   Hospital drug screen policy  Report to CPS   If child protective services report - county:  [place] If child protective services report - date:  06/19/2014 Information/referral to community resources comment:   CSW is working on Arbor[employer]urist a referral to [facility] in [place], [place], but is still awaiting documentation from [facility], which is needed before referral can be made.   Other social work plan:   MOB's UDS on admission was positive for Cocaine, Opiates, THC and Amphetamines.  Baby's UDS was negative.  CSW questions whether the sample was baby's first urine.  CSW will monitor MDS results and provide CPS worker  with this result.

## 2014-06-21 NOTE — Lactation Note (Signed)
This note was copied from the chart of Girl Zena Amosshley Leamy. Lactation Consultation Note  Patient Name: Girl Zena Amosshley Falotico Today's Date: 06/21/2014   Lactation has attempted to see mother today. She has either been in NICU, had visitors, gone to gift shop with an escort, and is now sleeping. Patient was caught with drug paraphernalia in her bed this hospital admission post delivery. She is also taking methadone. Due to illegal drug use beyond methadone, patient is to pump and dump her milk at this time. RN reports the mother has been pumping and discards. Unsure of patient's destination after discharge, SW trying to arrange for inpatient rehab. LC to follow while baby is in NICU and as needed. Will assess mother's need for a breast pump prior to discharge.  Maternal Data    Feeding    LATCH Score/Interventions                      Lactation Tools Discussed/Used     Consult Status      Christella HartiganDaly, Beverly M 06/21/2014, 4:05 PM

## 2014-06-21 NOTE — Progress Notes (Signed)
Postpartum Day 3: Cesarean Delivery Admission complicated with heroin abuse in hospital  Subjective: Patient reports no complaints.   As per RN, baby is doing well in NICU  Objective: Vital signs in last 24 hours: Temp:  [97.8 F (36.6 C)-99 F (37.2 C)] 98 F (36.7 C) (06/28 0543) Pulse Rate:  [64-86] 64 (06/28 0543) Resp:  [16] 16 (06/28 0543) BP: (101-123)/(58-74) 123/74 mmHg (06/28 0543) SpO2:  [98 %-99 %] 99 % (06/28 0543)  Physical Exam:  General: alert, cooperative and no distress Lochia: appropriate Uterine Fundus: firm Incision: clean, dry, intact DVT Evaluation: No evidence of DVT seen on physical exam.   Recent Labs  06/18/14 1548 06/19/14 0601  HGB 12.2 11.6*  HCT 37.4 35.0*    Assessment/Plan: Status post Cesarean section. Doing well postoperatively.  Plan to keep until Monday due to inpatient detox arrangements/transfer   Tereso NewcomerUgonna A Anyanwu, MD 06/21/2014, 7:29 AM

## 2014-06-22 LAB — OPIATE, QUANTITATIVE, URINE
CODEINE URINE: NEGATIVE ng/mL (ref <50)
Hydrocodone: NEGATIVE ng/mL (ref <50)
Hydromorphone GC/MS Conf: 94 ng/mL — ABNORMAL HIGH (ref <50)
Morphine, Confirm: 2127 ng/mL — ABNORMAL HIGH (ref <50)
Norhydrocodone, Ur: NEGATIVE ng/mL (ref <50)
Noroxycodone, Ur: NEGATIVE ng/mL (ref <50)
Oxycodone, ur: NEGATIVE ng/mL (ref <50)
Oxymorphone: NEGATIVE ng/mL (ref <50)

## 2014-06-22 LAB — METHADONE, CONFIRMATION
METHADONE GC/MS CONF: 661 ng/mL — AB (ref <100)
METHADONE METABOLITE, URINE: 5949 ng/mL — AB (ref <100)

## 2014-06-22 LAB — COCAINE, URINE, CONFIRMATION: Benzoylecgonine GC/MS Conf: 649 ng/mL — ABNORMAL HIGH (ref <100)

## 2014-06-22 MED ORDER — IBUPROFEN 600 MG PO TABS: 600.0000 mg | ORAL_TABLET | Freq: Four times a day (QID) | ORAL | Status: DC

## 2014-06-22 MED ORDER — OXYCODONE-ACETAMINOPHEN 5-325 MG PO TABS: 1.0000 | ORAL_TABLET | ORAL | Status: DC | PRN

## 2014-06-22 NOTE — Progress Notes (Addendum)
CSW met with MOB and Terrance/roommate to see how she has been coping over the weekend.  MOB was up and seemed to be in good spirits today.  MOB is understanding of the fact that she will have to discharge today and await a bed opening at [facility] while at home.  CSW is not aware as to how long the wait will be and has spoken with MOB as well as Ebony/[facility] Counselor as far as follow up on referral made by CSW today.  Ebony will make follow up contact.  MOB continues to report a strong desire for inpatient treatment and desires to continue Methadone treatment.  Therefore, CSW is not aware of any other options other than [facility].  MOB can go inpatient at [facility] for 2 weeks to detox and stabilize, but then she will either need to have the baby with her in order to remain in the post partum program, or she will need to make other arrangements.  CSW contacted Judith Jones/Perinatal Substance Abuse Specialist in [place], who is looking into possibilities as well.  CPS worker has met with MOB and states the meeting went well, however, she did not identify anyone as a possible placement option for baby while MOB is in treatment.  CSW asked MOB today if she has any options, and she states she would like CPS to consider her roommate Terrance Moore/[phone] and/or her mother/Jerrilynn Horgan/[phone].   CSW provided CPS worker with this information (names/contact numbers).  At this time, baby will remain in the hospital for an undetermined amount of time.  CSW recommends psychiatric follow up and provided information on how to access services at the [facility] while she waits for a bed at [facility].  MOB was pleasant as usual and receptive to CSW intervention.  She is in agreement that she needs mental health follow up.  CSW discussed signs and symptoms of PPD to watch for.  She is concerned about this given her current situation, but denies any symptoms after her other two  pregnancies.  MOB denies SI/HI.  She states thinking about dying in the past, but never having a plan to act on her thoughts.  No current thoughts of dying.  CSW instructed her to call 911 or go to the nearest ER if she ever has a plan to commit suicide.  She agreed.   

## 2014-06-22 NOTE — Progress Notes (Signed)
Subjective: Postpartum Day 3: Cesarean Delivery Patient reports tolerating PO, + flatus and no problems voiding.  Good pain control.  Pt is pumping breast milk and discarding it. Pt remains on Methadone.  No reports overnight of additional drug use.   Objective: Vital signs in last 24 hours: Temp:  [97.9 F (36.6 C)-98.1 F (36.7 C)] 98.1 F (36.7 C) (06/29 0540) Pulse Rate:  [69-79] 69 (06/29 0540) Resp:  [16-18] 16 (06/29 0540) BP: (108-118)/(63-68) 116/67 mmHg (06/29 0540) SpO2:  [98 %-100 %] 98 % (06/29 0540)  Physical Exam:  General: alert and no distress Lochia: appropriate Uterine Fundus: firm Incision: no significant drainage, dressing on and clean and dry DVT Evaluation: No evidence of DVT seen on physical exam.  No results found for this basename: HGB, HCT,  in the last 72 hours  Assessment/Plan: Status post Cesarean section. Doing well postoperatively.  social work is trying to find an inpatient drug treatment program for pt.  The disposition is to be determined by them. Pt is clinically ready for discharge. HARRAWAY-SMITH, CAROLYN 06/22/2014, 9:07 AM

## 2014-06-22 NOTE — Progress Notes (Signed)
Pt teaching complete   Prescription given

## 2014-06-22 NOTE — Progress Notes (Signed)
Ur chart review completed.  

## 2014-06-22 NOTE — Discharge Summary (Signed)
Obstetric Discharge Summary Reason for Admission: premature labor Prenatal Procedures: tocolysis Intrapartum Procedures: cesarean: low cervical, transverse Postpartum Procedures: none Complications-Operative and Postpartum: opioid use Hemoglobin  Date Value Ref Range Status  06/19/2014 11.6* 12.0 - 15.0 g/dL Final     HCT  Date Value Ref Range Status  06/19/2014 35.0* 36.0 - 46.0 % Final   Willodean Rosenthalarolyn Harraway-Smith, MD Physician Signed Obstetrics/Gynecology Progress Notes Service date: 06/22/2014 9:07 AM   Subjective:  Postpartum Day 3: Cesarean Delivery  Patient reports tolerating PO, + flatus and no problems voiding. Good pain control. Pt is pumping breast milk and discarding it. Pt remains on Methadone. No reports overnight of additional drug use.  Objective:  Vital signs in last 24 hours:  Temp: [97.9 F (36.6 C)-98.1 F (36.7 C)] 98.1 F (36.7 C) (06/29 0540)  Pulse Rate: [69-79] 69 (06/29 0540)  Resp: [16-18] 16 (06/29 0540)  BP: (108-118)/(63-68) 116/67 mmHg (06/29 0540)  SpO2: [98 %-100 %] 98 % (06/29 0540)  Physical Exam:  General: alert and no distress  Lochia: appropriate  Uterine Fundus: firm  Incision: no significant drainage, dressing on and clean and dry  DVT Evaluation: No evidence of DVT seen on physical exam.  No results found for this basename: HGB, HCT, in the last 72 hours  Assessment/Plan:  Status post Cesarean section. Doing well postoperatively.  social work is trying to find an inpatient drug treatment program for pt. The disposition is to be determined by them.  Pt is clinically ready for discharge.  HARRAWAY-SMITH, CAROLYN  06/22/2014, 9:07 AM      Discharge Diagnoses: premature delivery Patient Active Problem List   Diagnosis Date Noted  . S/P C-section 06/18/2014  . Placental abruption in third trimester 06/14/2014  . Supervision of high risk pregnancy due to social problems in third trimester 05/27/2014  . 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   Current Outpatient Prescriptions on File Prior to Encounter  Medication Sig Dispense Refill  . ampicillin (PRINCIPEN) 500 MG capsule Take 1 capsule (500 mg total) by mouth 4 (four) times daily.  28 capsule  0  . methadone (DOLOPHINE) 10 MG/ML solution Take 90 mg by mouth daily.          Discharge Information: Date: 06/22/2014 Activity: pelvic rest and no heavy lifting Diet: routine Medications: Ibuprofen and Percocet Condition: stable Instructions: refer to practice specific booklet Discharge to: home Follow-up Information   Follow up with [facility] In 6 weeks.   Contact information:   [address] [place] [address] [phone]       Newborn Data: Live born female  Birth Weight: 4 lb 3 oz (1900 g) APGAR: 9, 9  Baby in NICU  ARNOLD,JAMES 06/22/2014, 3:04 PM

## 2014-06-22 NOTE — Discharge Instructions (Signed)
Cesarean Delivery °Care After °Refer to this sheet in the next few weeks. These instructions provide you with information on caring for yourself after your procedure. Your health care provider may also give you specific instructions. Your treatment has been planned according to current medical practices, but problems sometimes occur. Call your health care provider if you have any problems or questions after you go home. °HOME CARE INSTRUCTIONS  °· Only take over-the-counter or prescription medications as directed by your health care provider. °· Do not drink alcohol, especially if you are breastfeeding or taking medication to relieve pain. °· Do not chew or smoke tobacco. °· Continue to use good perineal care. Good perineal care includes: °¨ Wiping your perineum from front to back. °¨ Keeping your perineum clean. °· Check your surgical cut (incision) daily for increased redness, drainage, swelling, or separation of skin. °· Clean your incision gently with soap and water every day, and then pat it dry. If your health care provider says it is OK, leave the incision uncovered. Use a bandage (dressing) if the incision is draining fluid or appears irritated. If the adhesive strips across the incision do not fall off within 7 days, carefully peel them off. °· Hug a pillow when coughing or sneezing until your incision is healed. This helps to relieve pain. °· Do not use tampons or douche until your health care provider says it is okay. °· Shower, wash your hair, and take tub baths as directed by your health care provider. °· Wear a well-fitting bra that provides breast support. °· Limit wearing support panties or control-top hose. °· Drink enough fluids to keep your urine clear or pale yellow. °· Eat high-fiber foods such as whole grain cereals and breads, brown rice, beans, and fresh fruits and vegetables every day. These foods may help prevent or relieve constipation. °· Resume activities such as climbing stairs,  driving, lifting, exercising, or traveling as directed by your health care provider. °· Talk to your health care provider about resuming sexual activities. This is dependent upon your risk of infection, your rate of healing, and your comfort and desire to resume sexual activity. °· Try to have someone help you with your household activities and your newborn for at least a few days after you leave the hospital. °· Rest as much as possible. Try to rest or take a nap when your newborn is sleeping. °· Increase your activities gradually. °· Keep all of your scheduled postpartum appointments. It is very important to keep your scheduled follow-up appointments. At these appointments, your health care provider will be checking to make sure that you are healing physically and emotionally. °SEEK MEDICAL CARE IF:  °· You are passing large clots from your vagina. Save any clots to show your health care provider. °· You have a foul smelling discharge from your vagina. °· You have trouble urinating. °· You are urinating frequently. °· You have pain when you urinate. °· You have a change in your bowel movements. °· You have increasing redness, pain, or swelling near your incision. °· You have pus draining from your incision. °· Your incision is separating. °· You have painful, hard, or reddened breasts. °· You have a severe headache. °· You have blurred vision or see spots. °· You feel sad or depressed. °· You have thoughts of hurting yourself or your newborn. °· You have questions about your care, the care of your newborn, or medications. °· You are dizzy or lightheaded. °· You have a rash. °· You   have pain, redness, or swelling at the site of the removed intravenous access (IV) tube. °· You have nausea or vomiting. °· You stopped breastfeeding and have not had a menstrual period within 12 weeks of stopping. °· You are not breastfeeding and have not had a menstrual period within 12 weeks of delivery. °· You have a fever. °SEEK  IMMEDIATE MEDICAL CARE IF: °· You have persistent pain. °· You have chest pain. °· You have shortness of breath. °· You faint. °· You have leg pain. °· You have stomach pain. °· Your vaginal bleeding saturates 2 or more sanitary pads in 1 hour. °MAKE SURE YOU:  °· Understand these instructions. °· Will watch your condition. °· Will get help right away if you are not doing well or get worse. °Document Released: 09/02/2002 Document Revised: 08/13/2013 Document Reviewed: 08/07/2012 °ExitCare® Patient Information ©2015 [employer]. This information is not intended to replace advice given to you by your health care provider. Make sure you discuss any questions you have with your health care provider. ° °

## 2014-06-23 ENCOUNTER — Ambulatory Visit: Payer: Self-pay

## 2014-06-23 NOTE — Lactation Note (Signed)
This note was copied from the chart of Briana Zena Amosshley Blankley. Lactation Consultation Note      Initial consult with this mom of a NICU baby, NAS. Mom an admitted drug abuser, entering a rehab facility. She is pumping and dumping for now, in hopes of being able to b-provide EBm for breast feed her baby in the future. Mom ias aware she can call lactation for questions/concerns, prn. [facility] and will get a [facility] DEP at that time.  Patient Name: Briana Zhang WUJWJ'XToday's Date: 06/23/2014     Maternal Data    Feeding Feeding Type: Formula Length of feed: 30 min  LATCH Score/Interventions                      Lactation Tools Discussed/Used     Consult Status      Alfred LevinsLee, Christine Anne 06/23/2014, 10:27 AM

## 2014-06-30 ENCOUNTER — Encounter: Payer: Medicaid Other | Admitting: Obstetrics and Gynecology

## 2014-07-04 ENCOUNTER — Emergency Department (HOSPITAL_COMMUNITY): Payer: Medicaid Other

## 2014-07-04 ENCOUNTER — Emergency Department (HOSPITAL_COMMUNITY)
Admission: EM | Admit: 2014-07-04 | Discharge: 2014-07-05 | Disposition: A | Discharge status: Home / Self Care | Payer: Medicaid Other | Attending: Emergency Medicine | Admitting: Emergency Medicine

## 2014-07-04 ENCOUNTER — Encounter (HOSPITAL_COMMUNITY): Payer: Self-pay | Admitting: Emergency Medicine

## 2014-07-04 DIAGNOSIS — N938 Other specified abnormal uterine and vaginal bleeding: Secondary | ICD-10-CM | POA: Insufficient documentation

## 2014-07-04 DIAGNOSIS — W19XXXA Unspecified fall, initial encounter: Secondary | ICD-10-CM

## 2014-07-04 DIAGNOSIS — Z8659 Personal history of other mental and behavioral disorders: Secondary | ICD-10-CM | POA: Diagnosis not present

## 2014-07-04 DIAGNOSIS — Z79899 Other long term (current) drug therapy: Secondary | ICD-10-CM | POA: Diagnosis not present

## 2014-07-04 DIAGNOSIS — Y9289 Other specified places as the place of occurrence of the external cause: Secondary | ICD-10-CM | POA: Diagnosis not present

## 2014-07-04 DIAGNOSIS — Z043 Encounter for examination and observation following other accident: Secondary | ICD-10-CM | POA: Diagnosis not present

## 2014-07-04 DIAGNOSIS — N949 Unspecified condition associated with female genital organs and menstrual cycle: Secondary | ICD-10-CM | POA: Insufficient documentation

## 2014-07-04 DIAGNOSIS — O909 Complication of the puerperium, unspecified: Secondary | ICD-10-CM | POA: Diagnosis present

## 2014-07-04 DIAGNOSIS — R1084 Generalized abdominal pain: Secondary | ICD-10-CM | POA: Insufficient documentation

## 2014-07-04 DIAGNOSIS — O99335 Smoking (tobacco) complicating the puerperium: Secondary | ICD-10-CM | POA: Diagnosis not present

## 2014-07-04 DIAGNOSIS — Z872 Personal history of diseases of the skin and subcutaneous tissue: Secondary | ICD-10-CM | POA: Diagnosis not present

## 2014-07-04 DIAGNOSIS — Z8669 Personal history of other diseases of the nervous system and sense organs: Secondary | ICD-10-CM | POA: Diagnosis not present

## 2014-07-04 DIAGNOSIS — O26899 Other specified pregnancy related conditions, unspecified trimester: Secondary | ICD-10-CM | POA: Diagnosis not present

## 2014-07-04 DIAGNOSIS — N925 Other specified irregular menstruation: Secondary | ICD-10-CM | POA: Diagnosis not present

## 2014-07-04 DIAGNOSIS — R296 Repeated falls: Secondary | ICD-10-CM | POA: Insufficient documentation

## 2014-07-04 DIAGNOSIS — Y9389 Activity, other specified: Secondary | ICD-10-CM | POA: Diagnosis not present

## 2014-07-04 LAB — CBC WITH DIFFERENTIAL/PLATELET
Basophils Absolute: 0 10*3/uL (ref 0.0–0.1)
Basophils Relative: 1 % (ref 0–1)
Eosinophils Absolute: 0 10*3/uL (ref 0.0–0.7)
Eosinophils Relative: 1 % (ref 0–5)
HCT: 39.6 % (ref 36.0–46.0)
Hemoglobin: 12.6 g/dL (ref 12.0–15.0)
LYMPHS ABS: 1.8 10*3/uL (ref 0.7–4.0)
LYMPHS PCT: 27 % (ref 12–46)
MCH: 25.8 pg — ABNORMAL LOW (ref 26.0–34.0)
MCHC: 31.8 g/dL (ref 30.0–36.0)
MCV: 81.1 fL (ref 78.0–100.0)
Monocytes Absolute: 0.5 10*3/uL (ref 0.1–1.0)
Monocytes Relative: 7 % (ref 3–12)
NEUTROS ABS: 4.3 10*3/uL (ref 1.7–7.7)
Neutrophils Relative %: 64 % (ref 43–77)
PLATELETS: 377 10*3/uL (ref 150–400)
RBC: 4.88 MIL/uL (ref 3.87–5.11)
RDW: 13.6 % (ref 11.5–15.5)
WBC: 6.7 10*3/uL (ref 4.0–10.5)

## 2014-07-04 LAB — I-STAT CHEM 8, ED
BUN: 8 mg/dL (ref 6–23)
CHLORIDE: 108 meq/L (ref 96–112)
CREATININE: 0.7 mg/dL (ref 0.50–1.10)
Calcium, Ion: 1.2 mmol/L (ref 1.12–1.23)
Glucose, Bld: 88 mg/dL (ref 70–99)
HCT: 40 % (ref 36.0–46.0)
Hemoglobin: 13.6 g/dL (ref 12.0–15.0)
POTASSIUM: 3.6 meq/L — AB (ref 3.7–5.3)
SODIUM: 142 meq/L (ref 137–147)
TCO2: 26 mmol/L (ref 0–100)

## 2014-07-04 MED ORDER — SODIUM CHLORIDE 0.9 % IV BOLUS (SEPSIS)
500.0000 mL | Freq: Once | INTRAVENOUS | Status: AC
  Administered 2014-07-04: 500 mL via INTRAVENOUS

## 2014-07-04 MED ORDER — IOHEXOL 300 MG/ML  SOLN
100.0000 mL | Freq: Once | INTRAMUSCULAR | Status: AC | PRN
  Administered 2014-07-04: 100 mL via INTRAVENOUS

## 2014-07-04 MED ORDER — IOHEXOL 300 MG/ML  SOLN: 50.0000 mL | Freq: Once | INTRAMUSCULAR | Status: DC | PRN

## 2014-07-04 NOTE — ED Provider Notes (Signed)
CSN: [id number]     Arrival date & time 07/04/14  2002 History   First MD Initiated Contact with Patient 07/04/14 2049     Chief Complaint  Patient presents with  . Abdominal Pain     (Consider location/radiation/quality/duration/timing/severity/associated sxs/prior Treatment) Patient is a 33 y.o. female presenting with abdominal pain. The history is provided by the patient.  Abdominal Pain Associated symptoms: vaginal bleeding   Associated symptoms: no chills, no fatigue, no nausea, no shortness of breath, no vaginal discharge and no vomiting    patient with abdominal pain. She is 4 weeks postpartum after C-section with a premature baby. She is on methadone for substance abuse. Today she was caught shoplifting and was tackled by police. She states since then she's had severe lower abdominal pain. She states that she also began to have some or vaginal bleeding. She states she's had some vaginal bleeding since the surgery, but had mostly stopped. She denies extremity pain. She denies numbness or weakness. She said she did not hit her head. She states the pain in her abdomen is severe.  Past Medical History  Diagnosis Date  . Bipolar 1 disorder   . Paranoid schizophrenia   . Cellulitis and abscess   . Substance abuse    Past Surgical History  Procedure Laterality Date  . Cesarean section    . Wisdom tooth extraction    . Cesarean section N/A 06/18/2014    Procedure: CESAREAN SECTION;  Surgeon: Adam PhenixJames G Arnold, MD;  Location: [facility];  Service: Obstetrics;  Laterality: N/A;   Family History  Problem Relation Age of Onset  . Cancer Mother   . Depression Mother   . Diabetes Father   . Cancer Maternal Grandmother   . Cancer Maternal Grandfather    History  Substance Use Topics  . Smoking status: Current Every Day Smoker -- 0.50 packs/day    Types: Cigarettes  . Smokeless tobacco: Never Used  . Alcohol Use: No   OB History   Grav Para Term Preterm Abortions TAB SAB Ect Mult Living    4 3 2 1 1 1    3      Review of Systems  Constitutional: Negative for chills and fatigue.  Respiratory: Negative for shortness of breath.   Gastrointestinal: Positive for abdominal pain. Negative for nausea and vomiting.  Genitourinary: Positive for vaginal bleeding. Negative for flank pain and vaginal discharge.  Musculoskeletal: Negative for back pain.  Skin: Negative for wound.  Hematological: Negative for adenopathy.      Allergies  Ciprofloxacin  Home Medications   Prior to Admission medications   Medication Sig Start Date End Date Taking? Authorizing Provider  ibuprofen (ADVIL,MOTRIN) 600 MG tablet Take 1 tablet (600 mg total) by mouth every 6 (six) hours. 06/22/14  Yes Adam PhenixJames G Arnold, MD  oxyCODONE-acetaminophen (PERCOCET/ROXICET) 5-325 MG per tablet Take 1-2 tablets by mouth every 4 (four) hours as needed for severe pain (moderate - severe pain). 06/22/14  Yes Adam PhenixJames G Arnold, MD   BP 115/91  Pulse 109  Temp(Src) 98.3 F (36.8 C) (Oral)  Resp 18  Ht 5\' 1"  (1.549 m)  Wt 115 lb (52.164 kg)  BMI 21.74 kg/m2  SpO2 96%  LMP 09/06/2013  Breastfeeding? No Physical Exam  Nursing note and vitals reviewed. Constitutional: She is oriented to person, place, and time. She appears well-developed and well-nourished.  HENT:  Head: Normocephalic and atraumatic.  Eyes: EOM are normal. Pupils are equal, round, and reactive to light.  Neck: Normal  range of motion. Neck supple.  Cardiovascular: Normal rate, regular rhythm and normal heart sounds.   No murmur heard. Pulmonary/Chest: Effort normal and breath sounds normal. No respiratory distress. She has no wheezes. She has no rales.  Abdominal: Soft. Bowel sounds are normal. She exhibits no distension. There is tenderness. There is no rebound and no guarding.  Lower abdominal/suprapubic postoperative wound. Wound is clear dry and intact. Tenderness at the site with diffuse abdominal tenderness also.,  Musculoskeletal: Normal range  of motion.  Neurological: She is alert and oriented to person, place, and time. No cranial nerve deficit.  Skin: Skin is warm and dry.  Diffuse rash and possible injection sites.  Psychiatric: She has a normal mood and affect. Her speech is normal.    ED Course  Procedures (including critical care time) Labs Review Labs Reviewed  CBC WITH DIFFERENTIAL - Abnormal; Notable for the following:    MCH 25.8 (*)    All other components within normal limits  I-STAT CHEM 8, ED - Abnormal; Notable for the following:    Potassium 3.6 (*)    All other components within normal limits    Imaging Review No results found.   EKG Interpretation None      MDM   Final diagnoses:  None    Patient with abdominal pain approximately 1 month after C-section. Had been tackled by police after attempting to shoplift. CT scan pending. Patient has a history of heroin abuse    Juliet Rude. Rubin Payor, MD 07/05/14 1610

## 2014-07-04 NOTE — ED Notes (Signed)
Pt states she was running from individuals after being detained for shoplifting, pt states she was placed on ground and now is having severe abd pain. C section 6/25. [facility]. Pt is now c/o abd pain. Pt is in [employer] custody at this time d/t outstanding warrants.

## 2014-07-04 NOTE — ED Notes (Signed)
Per EMS: Pt was shoplifting at [employer].  Trying to shoplist several cases of beer and a box of wine.  Pt delivered baby on June 25th.  Baby is in NICU for meth addiction.  Pt got tacked in the parking lot and is now c/o abd pain from her c-section.

## 2014-07-05 NOTE — Discharge Instructions (Signed)
Abdominal Pain, Women °Abdominal (stomach, pelvic, or belly) pain can be caused by many things. It is important to tell your doctor: °· The location of the pain. °· Does it come and go or is it present all the time? °· Are there things that start the pain (eating certain foods, exercise)? °· Are there other symptoms associated with the pain (fever, nausea, vomiting, diarrhea)? °All of this is helpful to know when trying to find the cause of the pain. °CAUSES  °· Stomach: virus or bacteria infection, or ulcer. °· Intestine: appendicitis (inflamed appendix), regional ileitis (Crohn's disease), ulcerative colitis (inflamed colon), irritable bowel syndrome, diverticulitis (inflamed diverticulum of the colon), or cancer of the stomach or intestine. °· Gallbladder disease or stones in the gallbladder. °· Kidney disease, kidney stones, or infection. °· Pancreas infection or cancer. °· Fibromyalgia (pain disorder). °· Diseases of the female organs: °¨ Uterus: fibroid (non-cancerous) tumors or infection. °¨ Fallopian tubes: infection or tubal pregnancy. °¨ Ovary: cysts or tumors. °¨ Pelvic adhesions (scar tissue). °¨ Endometriosis (uterus lining tissue growing in the pelvis and on the pelvic organs). °¨ Pelvic congestion syndrome (female organs filling up with blood just before the menstrual period). °¨ Pain with the menstrual period. °¨ Pain with ovulation (producing an egg). °¨ Pain with an IUD (intrauterine device, birth control) in the uterus. °¨ Cancer of the female organs. °· Functional pain (pain not caused by a disease, may improve without treatment). °· Psychological pain. °· Depression. °DIAGNOSIS  °Your doctor will decide the seriousness of your pain by doing an examination. °· Blood tests. °· X-rays. °· Ultrasound. °· CT scan (computed tomography, special type of X-ray). °· MRI (magnetic resonance imaging). °· Cultures, for infection. °· Barium enema (dye inserted in the large intestine, to better view it with  X-rays). °· Colonoscopy (looking in intestine with a lighted tube). °· Laparoscopy (minor surgery, looking in abdomen with a lighted tube). °· Major abdominal exploratory surgery (looking in abdomen with a large incision). °TREATMENT  °The treatment will depend on the cause of the pain.  °· Many cases can be observed and treated at home. °· Over-the-counter medicines recommended by your caregiver. °· Prescription medicine. °· Antibiotics, for infection. °· Birth control pills, for painful periods or for ovulation pain. °· Hormone treatment, for endometriosis. °· Nerve blocking injections. °· Physical therapy. °· Antidepressants. °· Counseling with a psychologist or psychiatrist. °· Minor or major surgery. °HOME CARE INSTRUCTIONS  °· Do not take laxatives, unless directed by your caregiver. °· Take over-the-counter pain medicine only if ordered by your caregiver. Do not take aspirin because it can cause an upset stomach or bleeding. °· Try a clear liquid diet (broth or water) as ordered by your caregiver. Slowly move to a bland diet, as tolerated, if the pain is related to the stomach or intestine. °· Have a thermometer and take your temperature several times a day, and record it. °· Bed rest and sleep, if it helps the pain. °· Avoid sexual intercourse, if it causes pain. °· Avoid stressful situations. °· Keep your follow-up appointments and tests, as your caregiver orders. °· If the pain does not go away with medicine or surgery, you may try: °¨ Acupuncture. °¨ Relaxation exercises (yoga, meditation). °¨ Group therapy. °¨ Counseling. °SEEK MEDICAL CARE IF:  °· You notice certain foods cause stomach pain. °· Your home care treatment is not helping your pain. °· You need stronger pain medicine. °· You want your IUD removed. °· You feel faint or   lightheaded. °· You develop nausea and vomiting. °· You develop a rash. °· You are having side effects or an allergy to your medicine. °SEEK IMMEDIATE MEDICAL CARE IF:  °· Your  pain does not go away or gets worse. °· You have a fever. °· Your pain is felt only in portions of the abdomen. The right side could possibly be appendicitis. The left lower portion of the abdomen could be colitis or diverticulitis. °· You are passing blood in your stools (bright red or black tarry stools, with or without vomiting). °· You have blood in your urine. °· You develop chills, with or without a fever. °· You pass out. °MAKE SURE YOU:  °· Understand these instructions. °· Will watch your condition. °· Will get help right away if you are not doing well or get worse. °Document Released: 10/08/2007 Document Revised: 03/04/2012 Document Reviewed: 10/28/2009 °ExitCare® Patient Information ©2015 [employer]. This information is not intended to replace advice given to you by your health care provider. Make sure you discuss any questions you have with your health care provider. ° °

## 2014-08-03 ENCOUNTER — Telehealth: Payer: Self-pay | Admitting: *Deleted

## 2014-08-03 ENCOUNTER — Ambulatory Visit: Payer: Medicaid Other | Admitting: Advanced Practice Midwife

## 2014-08-03 ENCOUNTER — Encounter: Payer: Self-pay | Admitting: *Deleted

## 2014-08-03 NOTE — Telephone Encounter (Signed)
Attempted to contact patient, no answer, left message for patient to contact clinic to reschedule appointment.  Will send letter.  Letter sent.

## 2014-10-26 ENCOUNTER — Encounter (HOSPITAL_COMMUNITY): Payer: Self-pay | Admitting: Emergency Medicine

## 2017-02-05 ENCOUNTER — Inpatient Hospital Stay
Admission: EM | Admit: 2017-02-05 | Discharge: 2017-02-07 | DRG: 872 | Disposition: A | Discharge status: Home / Self Care | Payer: Self-pay | Attending: Specialist | Admitting: Specialist

## 2017-02-05 ENCOUNTER — Emergency Department: Payer: Self-pay

## 2017-02-05 DIAGNOSIS — G629 Polyneuropathy, unspecified: Secondary | ICD-10-CM | POA: Diagnosis present

## 2017-02-05 DIAGNOSIS — Z881 Allergy status to other antibiotic agents status: Secondary | ICD-10-CM

## 2017-02-05 DIAGNOSIS — Z79899 Other long term (current) drug therapy: Secondary | ICD-10-CM

## 2017-02-05 DIAGNOSIS — R509 Fever, unspecified: Secondary | ICD-10-CM

## 2017-02-05 DIAGNOSIS — Z818 Family history of other mental and behavioral disorders: Secondary | ICD-10-CM

## 2017-02-05 DIAGNOSIS — F17210 Nicotine dependence, cigarettes, uncomplicated: Secondary | ICD-10-CM | POA: Diagnosis present

## 2017-02-05 DIAGNOSIS — F1120 Opioid dependence, uncomplicated: Secondary | ICD-10-CM | POA: Diagnosis present

## 2017-02-05 DIAGNOSIS — Z8659 Personal history of other mental and behavioral disorders: Secondary | ICD-10-CM

## 2017-02-05 DIAGNOSIS — F319 Bipolar disorder, unspecified: Secondary | ICD-10-CM | POA: Diagnosis present

## 2017-02-05 DIAGNOSIS — A419 Sepsis, unspecified organism: Principal | ICD-10-CM | POA: Diagnosis present

## 2017-02-05 DIAGNOSIS — D72829 Elevated white blood cell count, unspecified: Secondary | ICD-10-CM

## 2017-02-05 DIAGNOSIS — F200 Paranoid schizophrenia: Secondary | ICD-10-CM | POA: Diagnosis present

## 2017-02-05 LAB — CBC WITH DIFFERENTIAL/PLATELET
Basophils Absolute: 0 10*3/uL (ref 0–0.1)
Basophils Relative: 0 %
EOS PCT: 0 %
Eosinophils Absolute: 0 10*3/uL (ref 0–0.7)
HCT: 38 % (ref 35.0–47.0)
Hemoglobin: 12.7 g/dL (ref 12.0–16.0)
LYMPHS ABS: 1.6 10*3/uL (ref 1.0–3.6)
Lymphocytes Relative: 7 %
MCH: 25.5 pg — ABNORMAL LOW (ref 26.0–34.0)
MCHC: 33.4 g/dL (ref 32.0–36.0)
MCV: 76.2 fL — AB (ref 80.0–100.0)
Monocytes Absolute: 1.2 10*3/uL — ABNORMAL HIGH (ref 0.2–0.9)
Monocytes Relative: 6 %
Neutro Abs: 19.7 10*3/uL — ABNORMAL HIGH (ref 1.4–6.5)
Neutrophils Relative %: 87 %
Platelets: 487 10*3/uL — ABNORMAL HIGH (ref 150–440)
RBC: 4.98 MIL/uL (ref 3.80–5.20)
RDW: 13.6 % (ref 11.5–14.5)
WBC: 22.6 10*3/uL — AB (ref 3.6–11.0)

## 2017-02-05 LAB — BASIC METABOLIC PANEL
Anion gap: 8 (ref 5–15)
BUN: 10 mg/dL (ref 6–20)
CHLORIDE: 106 mmol/L (ref 101–111)
CO2: 21 mmol/L — AB (ref 22–32)
CREATININE: 0.84 mg/dL (ref 0.44–1.00)
Calcium: 8.6 mg/dL — ABNORMAL LOW (ref 8.9–10.3)
GFR calc Af Amer: >60 mL/min (ref >60)
GFR calc non Af Amer: >60 mL/min (ref >60)
Glucose, Bld: 118 mg/dL — ABNORMAL HIGH (ref 65–99)
POTASSIUM: 4.1 mmol/L (ref 3.5–5.1)
Sodium: 135 mmol/L (ref 135–145)

## 2017-02-05 LAB — INFLUENZA PANEL BY PCR (TYPE A & B)
INFLAPCR: NEGATIVE
Influenza B By PCR: NEGATIVE

## 2017-02-05 LAB — TROPONIN I: Troponin I: <0.03 ng/mL (ref <0.03)

## 2017-02-05 LAB — LACTIC ACID, PLASMA: Lactic Acid, Venous: 1.5 mmol/L (ref 0.5–1.9)

## 2017-02-05 MED ORDER — SODIUM CHLORIDE 0.9 % IV BOLUS (SEPSIS)
1000.0000 mL | Freq: Once | INTRAVENOUS | Status: AC
  Administered 2017-02-06: 1000 mL via INTRAVENOUS

## 2017-02-05 MED ORDER — VANCOMYCIN HCL IN DEXTROSE 1-5 GM/200ML-% IV SOLN
1000.0000 mg | Freq: Once | INTRAVENOUS | Status: AC
  Administered 2017-02-05: 1000 mg via INTRAVENOUS
  Filled 2017-02-05: qty 200

## 2017-02-05 MED ORDER — PIPERACILLIN-TAZOBACTAM 3.375 G IVPB 30 MIN
3.3750 g | Freq: Once | INTRAVENOUS | Status: AC
  Administered 2017-02-05: 3.375 g via INTRAVENOUS
  Filled 2017-02-05: qty 50

## 2017-02-05 MED ORDER — SODIUM CHLORIDE 0.9 % IV BOLUS (SEPSIS)
1000.0000 mL | Freq: Once | INTRAVENOUS | Status: AC
  Administered 2017-02-05: 1000 mL via INTRAVENOUS

## 2017-02-05 NOTE — ED Notes (Signed)
Attempted IV x 2 unsuccessful.

## 2017-02-05 NOTE — ED Triage Notes (Signed)
Pt reports fever, nausea and body aches X 1 week. Pt reports she took ibuprofen PTA. Pt alert and oriented X4, active, cooperative, pt in NAD. RR even and unlabored, color WNL.

## 2017-02-05 NOTE — ED Provider Notes (Signed)
[facility] Emergency Department Provider Note  ____________________________________________   I have reviewed the triage vital signs and the nursing notes.   HISTORY  Chief Complaint Fever; Nausea; and Generalized Body Aches   History limited by: Not Limited   HPI Briana Zhang is a 36 y.o. female who presents to the emergency department today from [facility] because of concerns for fever. Patient states that she was at [facility] to withdraw from multiple substances. She has been there 2 days. She started developing a fever today. This has been accompanied by extreme thirst. She denies any nausea or vomiting. Denies any chest pain or shortness of breath.   Past Medical History:  Diagnosis Date  . Bipolar 1 disorder (HCC)   . Cellulitis and abscess   . Paranoid schizophrenia (HCC)   . Substance abuse     Patient Active Problem List   Diagnosis Date Noted  . S/P C-section 06/18/2014  . Placental abruption in third trimester 06/14/2014  . Supervision of high risk pregnancy due to social problems in third trimester 05/27/2014  . 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 CESAREAN SECTION    . CESAREAN SECTION N/A 06/18/2014   Procedure: CESAREAN SECTION;  Surgeon: Adam Phenix, MD;  Location: [facility];  Service: Obstetrics;  Laterality: N/A;  . WISDOM TOOTH EXTRACTION      Prior to Admission medications   Medication Sig Start Date End Date Taking? Authorizing Provider  ibuprofen (ADVIL,MOTRIN) 600 MG tablet Take 1 tablet (600 mg total) by mouth every 6 (six) hours. 06/22/14   Adam Phenix, MD  oxyCODONE-acetaminophen (PERCOCET/ROXICET) 5-325 MG per tablet Take 1-2 tablets by mouth every 4 (four) hours as needed for severe pain (moderate - severe pain). 06/22/14   Adam Phenix, MD    Allergies Ciprofloxacin  Family History  Problem Relation Age of Onset  . Cancer Mother   . Depression Mother   .  Diabetes Father   . Cancer Maternal Grandmother   . Cancer Maternal Grandfather     Social History Social History  Substance Use Topics  . Smoking status: Current Every Day Smoker    Packs/day: 0.50    Types: Cigarettes  . Smokeless tobacco: Never Used  . Alcohol use No    Review of Systems  Constitutional: Positive for fever. Cardiovascular: Negative for chest pain. Respiratory: Negative for shortness of breath. Gastrointestinal: Negative for abdominal pain, vomiting and diarrhea. Genitourinary: Negative for dysuria. Musculoskeletal: Positive for body aches Skin: Negative for rash. Neurological: Positive for headache  10-point ROS otherwise negative.  ____________________________________________   PHYSICAL EXAM:  VITAL SIGNS: ED Triage Vitals  Enc Vitals Group     BP 02/05/17 2115 (!) 100/53     Pulse Rate 02/05/17 2115 (!) 120     Resp 02/05/17 2115 (!) 22     Temp 02/05/17 2115 (!) 100.5 F (38.1 C)     Temp Source 02/05/17 2115 Oral     SpO2 02/05/17 2115 100 %     Weight 02/05/17 2116 120 lb (54.4 kg)     Height 02/05/17 2116 5\' 1"  (1.549 m)     Head Circumference --      Peak Flow --      Pain Score 02/05/17 2116 5   Constitutional: Alert and oriented. Ill appearing Eyes: Conjunctivae are normal. Normal extraocular movements. ENT   Head: Normocephalic and atraumatic.   Nose: No congestion/rhinnorhea.   Mouth/Throat:  Mucous membranes are moist.   Neck: No stridor. Hematological/Lymphatic/Immunilogical: No cervical lymphadenopathy. Cardiovascular: Tachycardic, regular rhythm.  No murmurs, rubs, or gallops. Respiratory: Tachypnea. Breath sounds are clear and equal bilaterally. No wheezes/rales/rhonchi. Gastrointestinal: Soft and non tender. No rebound. No guarding.  Genitourinary: Deferred Musculoskeletal: Normal range of motion in all extremities. No lower extremity edema. Neurologic:  Normal speech and language. No gross focal neurologic  deficits are appreciated.  Skin:  Skin is warm, dry and intact. No rash noted. Psychiatric: Mood and affect are normal. Speech and behavior are normal. Patient exhibits appropriate insight and judgment.  ____________________________________________    LABS (pertinent positives/negatives)  Labs Reviewed  CBC WITH DIFFERENTIAL/PLATELET - Abnormal; Notable for the following:       Result Value   WBC 22.6 (*)    MCV 76.2 (*)    MCH 25.5 (*)    Platelets 487 (*)    Neutro Abs 19.7 (*)    Monocytes Absolute 1.2 (*)    All other components within normal limits  BASIC METABOLIC PANEL - Abnormal; Notable for the following:    CO2 21 (*)    Glucose, Bld 118 (*)    Calcium 8.6 (*)    All other components within normal limits  CULTURE, BLOOD (ROUTINE X 2)  CULTURE, BLOOD (ROUTINE X 2)  LACTIC ACID, PLASMA  TROPONIN I  URINALYSIS, COMPLETE (UACMP) WITH MICROSCOPIC  INFLUENZA PANEL BY PCR (TYPE A & B)     ____________________________________________   EKG  None  ____________________________________________    RADIOLOGY  CXR IMPRESSION: No active cardiopulmonary disease.   ____________________________________________   PROCEDURES  Procedures  ____________________________________________   INITIAL IMPRESSION / ASSESSMENT AND PLAN / ED COURSE  Pertinent labs & imaging results that were available during my care of the patient were reviewed by me and considered in my medical decision making (see chart for details).  Patient presented to the emergency department today because of concerns for fevers, body aches and feeling unwell. On exam patient does appear ill. She is tachycardic, febrile and tachypnea. Workup shows show a significant leukocytosis of 22. This point certainly fluid behind my list. Chest x-ray was negative for any pneumonia. Given history of IV drug use and no appearance will give patient broad-spectrum antibiotics. Will plan on admission to hospitalist  service.  ____________________________________________   FINAL CLINICAL IMPRESSION(S) / ED DIAGNOSES  Final diagnoses:  Fever, unspecified fever cause  Leukocytosis, unspecified type     Note: This dictation was prepared with Dragon dictation. Any transcriptional errors that result from this process are unintentional     Phineas SemenGraydon Kensie Susman, MD 02/05/17 2328

## 2017-02-05 NOTE — ED Notes (Signed)
Patient transported to X-ray 

## 2017-02-05 NOTE — ED Notes (Signed)
Called lab to re draw light green tube.

## 2017-02-05 NOTE — ED Notes (Signed)
Patient given coke and phone to make a call. Ok per Dr Derrill KayGoodman.

## 2017-02-05 NOTE — ED Notes (Signed)
Patient was at [facility] detoxing from heroin and crack. Patient states the fever started a while ago.

## 2017-02-05 NOTE — H&P (Signed)
[facility] [place] [facility]   PATIENT NAME: Briana Zhang    MR#:  [id number]  DATE OF BIRTH:  02/01/1981  DATE OF ADMISSION:  02/05/2017  PRIMARY CARE PHYSICIAN: No PCP Per Patient   REQUESTING/REFERRING PHYSICIAN: Derrill KayGoodman, MD  CHIEF COMPLAINT:   Chief Complaint  Patient presents with  . Fever  . Nausea  . Generalized Body Aches    HISTORY OF PRESENT ILLNESS:  Briana Zhang  is a 36 y.o. female who presents with Fever, malaise. Patient states that she was treated for pyelonephritis though more than a week ago. She states that she completed 7 days of antibiotics and the symptoms improved. She comes from [facility] where she was undergoing withdrawal from multiple substances. She denies any overt infectious symptoms other than fever and malaise. She states that she did develop somewhat of a sore throat today. She denies any cough, chest pain, shortness of breath, nausea or vomiting, abdominal discomfort, diarrhea, dysuria. She was flu negative in the ED. Hospitalists were called for admission  PAST MEDICAL HISTORY:   Past Medical History:  Diagnosis Date  . Bipolar 1 disorder (HCC)   . Cellulitis and abscess   . Paranoid schizophrenia (HCC)   . Substance abuse     PAST SURGICAL HISTORY:   Past Surgical History:  Procedure Laterality Date  . CESAREAN SECTION    . CESAREAN SECTION N/A 06/18/2014   Procedure: CESAREAN SECTION;  Surgeon: Adam PhenixJames G Arnold, MD;  Location: [facility];  Service: Obstetrics;  Laterality: N/A;  . WISDOM TOOTH EXTRACTION      SOCIAL HISTORY:   Social History  Substance Use Topics  . Smoking status: Current Every Day Smoker    Packs/day: 0.50    Types: Cigarettes  . Smokeless tobacco: Never Used  . Alcohol use No    FAMILY HISTORY:   Family History  Problem Relation Age of Onset  . Cancer Mother   . Depression Mother   . Diabetes Father   . Cancer Maternal Grandmother   . Cancer Maternal Grandfather     DRUG  ALLERGIES:   Allergies  Allergen Reactions  . Ciprofloxacin Swelling and Rash    MEDICATIONS AT HOME:   Prior to Admission medications   Medication Sig Start Date End Date Taking? Authorizing Provider  FLUoxetine (PROZAC) 20 MG capsule Take 20 mg by mouth daily.   Yes Historical Provider, MD  gabapentin (NEURONTIN) 300 MG capsule Take 600 mg by mouth 2 (two) times daily.   Yes Historical Provider, MD    REVIEW OF SYSTEMS:  Review of Systems  Constitutional: Positive for fever and malaise/fatigue. Negative for chills and weight loss.  HENT: Positive for sore throat. Negative for ear pain, hearing loss and tinnitus.   Eyes: Negative for blurred vision, double vision, pain and redness.  Respiratory: Negative for cough, hemoptysis and shortness of breath.   Cardiovascular: Negative for chest pain, palpitations, orthopnea and leg swelling.  Gastrointestinal: Negative for abdominal pain, constipation, diarrhea, nausea and vomiting.  Genitourinary: Negative for dysuria, frequency and hematuria.  Musculoskeletal: Negative for back pain, joint pain and neck pain.  Skin:       No acne, rash, or lesions  Neurological: Negative for dizziness, tremors, focal weakness and weakness.  Endo/Heme/Allergies: Negative for polydipsia. Does not bruise/bleed easily.  Psychiatric/Behavioral: Negative for depression. The patient is not nervous/anxious and does not have insomnia.      VITAL SIGNS:   Vitals:   02/05/17 2230 02/05/17 2300 02/05/17  2315 02/05/17 2322  BP: (!) 105/59 (!) 104/59    Pulse: (!) 103 92 86   Resp: (!) 28 16 (!) 26   Temp:    99.2 F (37.3 C)  TempSrc:    Oral  SpO2: 100% 97% 98%   Weight:      Height:       Wt Readings from Last 3 Encounters:  02/05/17 54.4 kg (120 lb)  07/04/14 52.2 kg (115 lb)  06/17/14 54.8 kg (120 lb 14.4 oz)    PHYSICAL EXAMINATION:  Physical Exam  Vitals reviewed. Constitutional: She is oriented to person, place, and time. She appears  well-developed and well-nourished. No distress.  HENT:  Head: Normocephalic and atraumatic.  Mouth/Throat: Oropharynx is clear and moist.  Eyes: Conjunctivae and EOM are normal. Pupils are equal, round, and reactive to light. No scleral icterus.  Neck: Normal range of motion. Neck supple. No JVD present. No thyromegaly present.  Cardiovascular: Normal rate, regular rhythm and intact distal pulses.  Exam reveals no gallop and no friction rub.   No murmur heard. Respiratory: Effort normal and breath sounds normal. No respiratory distress. She has no wheezes. She has no rales.  GI: Soft. Bowel sounds are normal. She exhibits no distension. There is no tenderness.  Musculoskeletal: Normal range of motion. She exhibits no edema.  No arthritis, no gout  Lymphadenopathy:    She has no cervical adenopathy.  Neurological: She is alert and oriented to person, place, and time. No cranial nerve deficit.  No dysarthria, no aphasia  Skin: Skin is warm and dry. No rash noted. No erythema.  Psychiatric: She has a normal mood and affect. Her behavior is normal. Judgment and thought content normal.    LABORATORY PANEL:   CBC  Recent Labs Lab 02/05/17 2138  WBC 22.6*  HGB 12.7  HCT 38.0  PLT 487*   ------------------------------------------------------------------------------------------------------------------  Chemistries   Recent Labs Lab 02/05/17 2240  NA 135  K 4.1  CL 106  CO2 21*  GLUCOSE 118*  BUN 10  CREATININE 0.84  CALCIUM 8.6*   ------------------------------------------------------------------------------------------------------------------  Cardiac Enzymes  Recent Labs Lab 02/05/17 2240  TROPONINI <0.03   ------------------------------------------------------------------------------------------------------------------  RADIOLOGY:  Dg Chest 2 View  Result Date: 02/05/2017 CLINICAL DATA:  Fever, nausea, and body aches for 1 week. EXAM: CHEST  2 VIEW COMPARISON:   07/03/2005 FINDINGS: Mild hyperinflation. Normal heart size and pulmonary vascularity. No focal airspace disease or consolidation in the lungs. No blunting of costophrenic angles. No pneumothorax. Mediastinal contours appear intact. IMPRESSION: No active cardiopulmonary disease. Electronically Signed   By: Burman Nieves M.D.   On: 02/05/2017 22:59    EKG:  No orders found for this or any previous visit.  IMPRESSION AND PLAN:  Principal Problem:   Sepsis (HCC) - unclear source at this point. Suspect possibly urinary source given her recent treatment for urinary infection, UA and urine culture still pending. She is also complaining of sore throat so we will check a strep a culture, and blood cultures are also pending. We will keep her on broad-spectrum IV antibiotics for now Active Problems:   Substance abuse - patient came from rehabilitation, we will have her on CIWA protocol  All the records are reviewed and case discussed with ED provider. Management plans discussed with the patient and/or family.  DVT PROPHYLAXIS: SubQ lovenox  GI PROPHYLAXIS: None  ADMISSION STATUS: Inpatient  CODE STATUS: Full Code Status History    Date Active Date Inactive Code Status Order  ID Comments User Context   06/18/2014  7:11 PM 06/22/2014  7:29 PM Full Code [id number]  Vale Haven, MD Inpatient   06/14/2014  6:50 PM 06/18/2014  7:11 PM Full Code [id number]  Allie Bossier, MD Inpatient   07/25/2013  1:22 PM 07/25/2013  6:10 PM Full Code [id number]  Carlyle Dolly, PA-C ED   05/30/2012  3:25 AM 05/31/2012  4:08 PM Full Code [id number]  Magnus Sinning, PA-C ED      TOTAL TIME TAKING CARE OF THIS PATIENT: 45 minutes.    Larua Collier FIELDING 02/05/2017, 11:54 PM  [facility]  Office  [phone]  CC: Primary care physician; No PCP Per Patient

## 2017-02-05 NOTE — ED Notes (Signed)
Asked lab to draw 2nd set of cultures.

## 2017-02-06 LAB — BASIC METABOLIC PANEL
ANION GAP: 7 (ref 5–15)
BUN: 6 mg/dL (ref 6–20)
CALCIUM: 7.9 mg/dL — AB (ref 8.9–10.3)
CO2: 21 mmol/L — ABNORMAL LOW (ref 22–32)
Chloride: 110 mmol/L (ref 101–111)
Creatinine, Ser: 0.73 mg/dL (ref 0.44–1.00)
Glucose, Bld: 91 mg/dL (ref 65–99)
Potassium: 3.8 mmol/L (ref 3.5–5.1)
Sodium: 138 mmol/L (ref 135–145)

## 2017-02-06 LAB — CBC
HCT: 32.7 % — ABNORMAL LOW (ref 35.0–47.0)
HEMOGLOBIN: 10.9 g/dL — AB (ref 12.0–16.0)
MCH: 25.6 pg — ABNORMAL LOW (ref 26.0–34.0)
MCHC: 33.3 g/dL (ref 32.0–36.0)
MCV: 77 fL — ABNORMAL LOW (ref 80.0–100.0)
PLATELETS: 374 10*3/uL (ref 150–440)
RBC: 4.25 MIL/uL (ref 3.80–5.20)
RDW: 13.4 % (ref 11.5–14.5)
WBC: 18.8 10*3/uL — ABNORMAL HIGH (ref 3.6–11.0)

## 2017-02-06 LAB — PREGNANCY, URINE: Preg Test, Ur: NEGATIVE

## 2017-02-06 LAB — URINALYSIS, COMPLETE (UACMP) WITH MICROSCOPIC
Bacteria, UA: NONE SEEN
Bilirubin Urine: NEGATIVE
GLUCOSE, UA: NEGATIVE mg/dL
Hgb urine dipstick: NEGATIVE
Ketones, ur: NEGATIVE mg/dL
LEUKOCYTES UA: NEGATIVE
Nitrite: NEGATIVE
PH: 7 (ref 5.0–8.0)
Protein, ur: NEGATIVE mg/dL
SPECIFIC GRAVITY, URINE: 1.006 (ref 1.005–1.030)

## 2017-02-06 LAB — MRSA PCR SCREENING: MRSA by PCR: POSITIVE — AB

## 2017-02-06 MED ORDER — PIPERACILLIN-TAZOBACTAM 3.375 G IVPB
3.3750 g | Freq: Three times a day (TID) | INTRAVENOUS | Status: DC
  Administered 2017-02-06 – 2017-02-07 (×4): 3.375 g via INTRAVENOUS
  Filled 2017-02-06 (×4): qty 50

## 2017-02-06 MED ORDER — GABAPENTIN 300 MG PO CAPS
600.0000 mg | ORAL_CAPSULE | Freq: Two times a day (BID) | ORAL | Status: DC
  Administered 2017-02-06 – 2017-02-07 (×4): 600 mg via ORAL
  Filled 2017-02-06 (×4): qty 2

## 2017-02-06 MED ORDER — THIAMINE HCL 100 MG/ML IJ SOLN: 100.0000 mg | Freq: Every day | INTRAMUSCULAR | Status: DC

## 2017-02-06 MED ORDER — LORAZEPAM 2 MG/ML IJ SOLN: 0.0000 mg | Freq: Two times a day (BID) | INTRAMUSCULAR | Status: DC

## 2017-02-06 MED ORDER — FOLIC ACID 1 MG PO TABS
1.0000 mg | ORAL_TABLET | Freq: Every day | ORAL | Status: DC
  Administered 2017-02-06 – 2017-02-07 (×2): 1 mg via ORAL
  Filled 2017-02-06 (×2): qty 1

## 2017-02-06 MED ORDER — ENSURE ENLIVE PO LIQD
237.0000 mL | Freq: Two times a day (BID) | ORAL | Status: DC
  Administered 2017-02-07: 237 mL via ORAL

## 2017-02-06 MED ORDER — SODIUM CHLORIDE 0.9 % IV BOLUS (SEPSIS)
500.0000 mL | Freq: Once | INTRAVENOUS | Status: AC
  Administered 2017-02-06: 500 mL via INTRAVENOUS

## 2017-02-06 MED ORDER — VITAMIN B-1 100 MG PO TABS
100.0000 mg | ORAL_TABLET | Freq: Every day | ORAL | Status: DC
  Administered 2017-02-06 – 2017-02-07 (×2): 100 mg via ORAL
  Filled 2017-02-06 (×2): qty 1

## 2017-02-06 MED ORDER — LORAZEPAM 1 MG PO TABS
1.0000 mg | ORAL_TABLET | Freq: Four times a day (QID) | ORAL | Status: DC | PRN
  Administered 2017-02-06 (×2): 1 mg via ORAL
  Filled 2017-02-06 (×3): qty 1

## 2017-02-06 MED ORDER — SODIUM CHLORIDE 0.9% FLUSH
3.0000 mL | Freq: Two times a day (BID) | INTRAVENOUS | Status: DC
  Administered 2017-02-06 – 2017-02-07 (×4): 3 mL via INTRAVENOUS

## 2017-02-06 MED ORDER — ENOXAPARIN SODIUM 40 MG/0.4ML ~~LOC~~ SOLN
40.0000 mg | Freq: Every day | SUBCUTANEOUS | Status: DC
  Filled 2017-02-06: qty 0.4

## 2017-02-06 MED ORDER — LORAZEPAM 1 MG PO TABS
1.0000 mg | ORAL_TABLET | Freq: Four times a day (QID) | ORAL | Status: DC | PRN
  Administered 2017-02-07: 1 mg via ORAL

## 2017-02-06 MED ORDER — SODIUM CHLORIDE 0.9 % IV SOLN
INTRAVENOUS | Status: AC
  Administered 2017-02-06: 13:00:00 via INTRAVENOUS

## 2017-02-06 MED ORDER — ACETAMINOPHEN 325 MG PO TABS
650.0000 mg | ORAL_TABLET | Freq: Four times a day (QID) | ORAL | Status: DC | PRN
  Administered 2017-02-06: 650 mg via ORAL
  Filled 2017-02-06: qty 2

## 2017-02-06 MED ORDER — ACETAMINOPHEN 650 MG RE SUPP: 650.0000 mg | Freq: Four times a day (QID) | RECTAL | Status: DC | PRN

## 2017-02-06 MED ORDER — ONDANSETRON HCL 4 MG PO TABS: 4.0000 mg | ORAL_TABLET | Freq: Four times a day (QID) | ORAL | Status: DC | PRN

## 2017-02-06 MED ORDER — LORAZEPAM 2 MG/ML IJ SOLN: 1.0000 mg | Freq: Four times a day (QID) | INTRAMUSCULAR | Status: DC | PRN

## 2017-02-06 MED ORDER — VANCOMYCIN HCL IN DEXTROSE 750-5 MG/150ML-% IV SOLN
750.0000 mg | Freq: Two times a day (BID) | INTRAVENOUS | Status: DC
  Administered 2017-02-06 (×2): 750 mg via INTRAVENOUS
  Filled 2017-02-06 (×4): qty 150

## 2017-02-06 MED ORDER — ONDANSETRON HCL 4 MG/2ML IJ SOLN: 4.0000 mg | Freq: Four times a day (QID) | INTRAMUSCULAR | Status: DC | PRN

## 2017-02-06 MED ORDER — PHENOL 1.4 % MT LIQD
1.0000 | OROMUCOSAL | Status: DC | PRN
  Filled 2017-02-06: qty 177

## 2017-02-06 MED ORDER — ADULT MULTIVITAMIN W/MINERALS CH
1.0000 | ORAL_TABLET | Freq: Every day | ORAL | Status: DC
  Administered 2017-02-06 – 2017-02-07 (×2): 1 via ORAL
  Filled 2017-02-06 (×2): qty 1

## 2017-02-06 MED ORDER — MUPIROCIN 2 % EX OINT
1.0000 "application " | TOPICAL_OINTMENT | Freq: Two times a day (BID) | CUTANEOUS | Status: DC
  Administered 2017-02-06 – 2017-02-07 (×3): 1 via NASAL
  Filled 2017-02-06: qty 22

## 2017-02-06 MED ORDER — FLUOXETINE HCL 20 MG PO CAPS
20.0000 mg | ORAL_CAPSULE | Freq: Every day | ORAL | Status: DC
  Administered 2017-02-06 – 2017-02-07 (×2): 20 mg via ORAL
  Filled 2017-02-06 (×2): qty 1

## 2017-02-06 MED ORDER — CHLORHEXIDINE GLUCONATE CLOTH 2 % EX PADS: 6.0000 | MEDICATED_PAD | Freq: Every day | CUTANEOUS | Status: DC

## 2017-02-06 MED ORDER — CEPASTAT 14.5 MG MT LOZG
1.0000 | LOZENGE | OROMUCOSAL | Status: DC | PRN
  Administered 2017-02-06 (×3): 1 via BUCCAL
  Filled 2017-02-06: qty 9

## 2017-02-06 MED ORDER — LORAZEPAM 2 MG/ML IJ SOLN: 0.0000 mg | Freq: Four times a day (QID) | INTRAMUSCULAR | Status: DC

## 2017-02-06 MED ORDER — SODIUM CHLORIDE 0.9 % IV SOLN
INTRAVENOUS | Status: AC
  Administered 2017-02-06: 01:00:00 via INTRAVENOUS

## 2017-02-06 NOTE — Progress Notes (Signed)
Dr. Cherlynn KaiserSainani notified of BP (!) 84/55 (BP Location: Right Arm)   Pulse 93   Temp 99.2 F (37.3 C) (Oral)   Resp 16   Ht 5\' 1"  (1.549 m)   Wt 54.4 kg (120 lb)   LMP 01/29/2017 (Approximate)   SpO2 100%   BMI 22.67 kg/m . MD order to give 500 ml Bolus.

## 2017-02-06 NOTE — Progress Notes (Signed)
Initial Nutrition Assessment  DOCUMENTATION CODES:   Severe malnutrition in context of acute illness/injury  INTERVENTION:  Provide Ensure Enlive po BID, each supplement provides 350 kcal and 20 grams of protein.   Encouraged adequate intake of calories and protein from meals and beverages to prevent further weight loss.   NUTRITION DIAGNOSIS:   Malnutrition (Severe) related to acute illness, nausea, poor appetite (pyelonephritis, sepsis) as evidenced by energy intake < or equal to 50% for > or equal to 5 days, 6.3 percent weight loss over 1 month.  GOAL:   Patient will meet greater than or equal to 90% of their needs  MONITOR:   PO intake, Supplement acceptance, Labs, I & O's, Weight trends  REASON FOR ASSESSMENT:   Malnutrition Screening Tool    ASSESSMENT:   36 year old female with PMHx of bipolar disorder, paranoid schizophrenia, substance abuse who presents with fever and malaise s/p 7 day course of antibiotics for pyelonephritis. Patient found to have sepsis.   -Of note, patient is also on CIWA protocol as she presents from rehab where she was undergoing withdrawal from multiple substances.   Spoke with patient at bedside. She reports her appetite has been poor for the past 2 weeks. She reports she was experiencing nausea and "got sick" after she ate (did not clarify any further). She reports for the past few weeks she has mainly just been eating liquids and applesauce. Patient also reports sore throat. Patient reports her appetite is improving now and she is finishing approximately 50% of meals (meal completion not recorded in chart). Patient is amenable to drinking Ensure between meals.   Patient reports UBW 120 lbs and that she has lost 20-25 lbs in the past month. Weight on admission looks to be reported weight instead of measured. RD obtained bed scale weight of 112.5 lbs, but this is likely falsely elevated as there were 2 pillows, 1 blanked, and 1 large comforter  patient likely brought from home. Even the weight loss of 7.5 lbs (6.3% body weight) is significant weight loss over 1 month.   Medications reviewed and include: folic acid 1 mg daily, multivitamin with minerals daily, Zosyn, thiamine 100 mg daily, vancomycin, NS @ 75 ml/hr.  Labs reviewed: CO2 21.  Nutrition-Focused physical exam completed. Findings are no fat depletion, no muscle depletion, and no edema.   Discussed with RN.   Diet Order:  Diet regular Room service appropriate? Yes; Fluid consistency: Thin  Skin:  Reviewed, no issues  Last BM:  Unknown  Height:   Ht Readings from Last 1 Encounters:  02/05/17 5\' 1"  (1.549 m)    Weight:   Wt Readings from Last 1 Encounters:  02/06/17 112 lb 8 oz (51 kg)    Ideal Body Weight:  47.7 kg  BMI:  Body mass index is 21.26 kg/m.  Estimated Nutritional Needs:   Kcal:  1500-1615 (MSJ x 1.3-1.4)  Protein:  60-70 grams (1.2-1.4 grams/kg)  Fluid:  1.5-1.8 L/day (30-35 ml/kg)  EDUCATION NEEDS:   No education needs identified at this time  Helane RimaLeanne Anyelo Mccue, MS, RD, LDN Pager: [phone] After Hours Pager: [phone]

## 2017-02-06 NOTE — Plan of Care (Signed)
Problem: Pain Managment: Goal: General experience of comfort will improve Outcome: Progressing Cepacol lozenge effective for ease of throat pain.

## 2017-02-06 NOTE — ED Notes (Signed)
Admitting Provider at bedside. 

## 2017-02-06 NOTE — Progress Notes (Signed)
Patient complaining of headache and anxiety. Requesting PO Ativan. Scoring 4 on CIWA scale, IV ativan not required. MD order for 1 mg Ativan PO Q6 PRN.

## 2017-02-06 NOTE — Progress Notes (Signed)
Pharmacy Antibiotic Note  Briana Zhang is a 36 y.o. female admitted on 02/05/2017 with sepsis.  Pharmacy has been consulted for Zosyn and vancomycin dosing.  Plan: 1. Zosyn 3.375 gm IV Q8H EI 2. Vancomycin 1 gm IV x 1 in ED followed in 12 hours (stacked dosing) by vancomycin 750 mg IV Q12H, predicted trough 20 mcg/mL. Pharmacy will continue to follow and adjust as needed to maintain trough 15 to 20 mcg/mL.   Vd 33.1 L, Ke 0.063 hr-1, T1/2 10.9 hr  Height: 5\' 1"  (154.9 cm) Weight: 120 lb (54.4 kg) IBW/kg (Calculated) : 47.8  Temp (24hrs), Avg:99.6 F (37.6 C), Min:99.1 F (37.3 C), Max:100.5 F (38.1 C)   Recent Labs Lab 02/05/17 2138 02/05/17 2240  WBC 22.6*  --   CREATININE  --  0.84  LATICACIDVEN 1.5  --     Estimated Creatinine Clearance: 71.9 mL/min (by C-G formula based on SCr of 0.84 mg/dL).    Allergies  Allergen Reactions  . Ciprofloxacin Swelling and Rash   Thank you for allowing pharmacy to be a part of this patient's care.  Carola FrostNathan A Ceili Boshers, Pharm.D., BCPS Clinical Pharmacist 02/06/2017 12:48 AM

## 2017-02-06 NOTE — Progress Notes (Signed)
[facility] - [place] at [facility]   PATIENT NAME: Briana Zhang    MR#:  [id number]  DATE OF BIRTH:  05/16/1981  SUBJECTIVE:   Patient here due to presumed sepsis. Present with a fever of 101. Afebrile overnight. No other complaints presently.  REVIEW OF SYSTEMS:    Review of Systems  Constitutional: Negative for chills and fever.  HENT: Negative for congestion and tinnitus.   Eyes: Negative for blurred vision and double vision.  Respiratory: Negative for cough, shortness of breath and wheezing.   Cardiovascular: Negative for chest pain, orthopnea and PND.  Gastrointestinal: Negative for abdominal pain, diarrhea, nausea and vomiting.  Genitourinary: Negative for dysuria and hematuria.  Neurological: Negative for dizziness, sensory change and focal weakness.  All other systems reviewed and are negative.   Nutrition: Regular Tolerating Diet: Yes Tolerating PT: Ambulatory     DRUG ALLERGIES:   Allergies  Allergen Reactions  . Ciprofloxacin Swelling and Rash    VITALS:  Blood pressure (!) 84/55, pulse 93, temperature 99.2 F (37.3 C), temperature source Oral, resp. rate 16, height 5\' 1"  (1.549 m), weight 51 kg (112 lb 8 oz), last menstrual period 01/29/2017, SpO2 100 %.  PHYSICAL EXAMINATION:   Physical Exam  GENERAL:  36 y.o.-year-old patient lying in the bed in no acute distress.  EYES: Pupils equal, round, reactive to light and accommodation. No scleral icterus. Extraocular muscles intact.  HEENT: Head atraumatic, normocephalic. Oropharynx and nasopharynx clear.  NECK:  Supple, no jugular venous distention. No thyroid enlargement, no tenderness.  LUNGS: Normal breath sounds bilaterally, no wheezing, rales, rhonchi. No use of accessory muscles of respiration.  CARDIOVASCULAR: S1, S2 normal. No murmurs, rubs, or gallops.  ABDOMEN: Soft, nontender, nondistended. Bowel sounds present. No organomegaly or mass.  EXTREMITIES: No cyanosis, clubbing or  edema b/l.    NEUROLOGIC: Cranial nerves II through XII are intact. No focal Motor or sensory deficits b/l.   PSYCHIATRIC: The patient is alert and oriented x 3.  SKIN: No obvious rash, lesion, or ulcer.    LABORATORY PANEL:   CBC  Recent Labs Lab 02/06/17 0437  WBC 18.8*  HGB 10.9*  HCT 32.7*  PLT 374   ------------------------------------------------------------------------------------------------------------------  Chemistries   Recent Labs Lab 02/06/17 0437  NA 138  K 3.8  CL 110  CO2 21*  GLUCOSE 91  BUN 6  CREATININE 0.73  CALCIUM 7.9*   ------------------------------------------------------------------------------------------------------------------  Cardiac Enzymes  Recent Labs Lab 02/05/17 2240  TROPONINI <0.03   ------------------------------------------------------------------------------------------------------------------  RADIOLOGY:  Dg Chest 2 View  Result Date: 02/05/2017 CLINICAL DATA:  Fever, nausea, and body aches for 1 week. EXAM: CHEST  2 VIEW COMPARISON:  07/03/2005 FINDINGS: Mild hyperinflation. Normal heart size and pulmonary vascularity. No focal airspace disease or consolidation in the lungs. No blunting of costophrenic angles. No pneumothorax. Mediastinal contours appear intact. IMPRESSION: No active cardiopulmonary disease. Electronically Signed   By: Burman NievesWilliam  Stevens M.D.   On: 02/05/2017 22:59     ASSESSMENT AND PLAN:   36 year old female with past medical history of alcohol abuse, bipolar disorder, schizophrenia who presented to the hospital from [facility] due to fever and presumed sepsis.  1. Sepsis-this was the working diagnosis on admission given the patient's fever, tachycardia and leukocytosis. -Source of the sepsis is still unclear. Patient did have a urinary tract infection prior to coming to the hospital although her urinalysis is negative here. -Chest x-ray negative for any acute pathology, continue empiric vancomycin, Zosyn.  We'll get a MRSA  PCR negative negative can DC vancomycin. Follow cultures, fever curve.  2. History of alcohol abuse-continue CIWA protocol. - pt. Was at [facility] for detox.   3. Neuropathy - cont. Gabapentin  4. Depression - cont. Prozac.    All the records are reviewed and case discussed with Care Management/Social Worker. Management plans discussed with the patient, family and they are in agreement.  CODE STATUS: Full code  DVT Prophylaxis: Lovenox  TOTAL TIME TAKING CARE OF THIS PATIENT: 25 minutes.   POSSIBLE D/C IN 1-2 DAYS, DEPENDING ON CLINICAL CONDITION.   Houston Siren M.D on 02/06/2017 at 2:15 PM  Between 7am to 6pm - Pager - [phone]  After 6pm go to [email or web address] - Scientist, research (life sciences) [facility]  Office  [phone]  CC: Primary care physician; No PCP Per Patient

## 2017-02-06 NOTE — Progress Notes (Signed)
Dr. Sheryle Hailiamond notified of fever 102.6; acknowledged. Blood cultures and urine already sent, also throat strep swab. Windy Carinaurner,Coulton Schlink K, RN 5:27 AM 02/06/2017

## 2017-02-07 LAB — CBC
HEMATOCRIT: 34.4 % — AB (ref 35.0–47.0)
HEMOGLOBIN: 11.6 g/dL — AB (ref 12.0–16.0)
MCH: 26 pg (ref 26.0–34.0)
MCHC: 33.7 g/dL (ref 32.0–36.0)
MCV: 77 fL — ABNORMAL LOW (ref 80.0–100.0)
Platelets: 387 10*3/uL (ref 150–440)
RBC: 4.46 MIL/uL (ref 3.80–5.20)
RDW: 13.5 % (ref 11.5–14.5)
WBC: 9.7 10*3/uL (ref 3.6–11.0)

## 2017-02-07 MED ORDER — SULFAMETHOXAZOLE-TRIMETHOPRIM 800-160 MG PO TABS: 1.0000 | ORAL_TABLET | Freq: Two times a day (BID) | ORAL | 0 refills | Status: AC

## 2017-02-07 NOTE — Discharge Summary (Signed)
[employer] - [place] at [facility]   PATIENT NAME: Briana Zhang    MR#:  [id number]  DATE OF BIRTH:  07/02/1981  DATE OF ADMISSION:  02/05/2017 ADMITTING PHYSICIAN: Oralia Manis, MD  DATE OF DISCHARGE: 02/07/2017  PRIMARY CARE PHYSICIAN: No PCP Per Patient    ADMISSION DIAGNOSIS:  Fever, unspecified fever cause [R50.9] Leukocytosis, unspecified type [D72.829]  DISCHARGE DIAGNOSIS:  Principal Problem:   Sepsis (HCC) Active Problems:   Methadone dependence (HCC)   SECONDARY DIAGNOSIS:   Past Medical History:  Diagnosis Date  . Bipolar 1 disorder (HCC)   . Cellulitis and abscess   . Paranoid schizophrenia (HCC)   . Substance abuse     HOSPITAL COURSE:   36 year old female with past medical history of bipolar disorder, substance abuse, schizophrenia who was admitted to the hospital from alcohol detox due to fever and presumed sepsis.  1. Sepsis-this was the working diagnosis on admission given the patient's fever, tachycardia and leukocytosis. -Patient had a urinary tract infection prior to coming to the hospital although her urinalysis here in the hospital was negative. Her other sepsis workup including chest x-ray, cultures have remained negative. -Patient was empirically treated with IV vancomycin, Zosyn. Her flu PCR was also negative. -Patient's leukocytosis has resolved, she been afebrile for the past 48 hours and has no clinical symptoms and therefore not being discharged empirically on oral Bactrim for a few days.  2. History of alcohol abuse- maintained on [facility] protocol and will be discharged back to [facility]. -  3. Neuropathy - she will cont. Gabapentin  4. Depression - she will cont. Prozac.  DISCHARGE CONDITIONS:   Stable.   CONSULTS OBTAINED:    DRUG ALLERGIES:   Allergies  Allergen Reactions  . Ciprofloxacin Swelling and Rash    DISCHARGE MEDICATIONS:   Allergies as of 02/07/2017      Reactions   Ciprofloxacin Swelling, Rash      Medication List    TAKE these medications   FLUoxetine 20 MG capsule Commonly known as:  PROZAC Take 20 mg by mouth daily.   gabapentin 300 MG capsule Commonly known as:  NEURONTIN Take 600 mg by mouth 2 (two) times daily.   sulfamethoxazole-trimethoprim 800-160 MG tablet Commonly known as:  BACTRIM DS,SEPTRA DS Take 1 tablet by mouth 2 (two) times daily.         DISCHARGE INSTRUCTIONS:   DIET:  Regular diet  DISCHARGE CONDITION:  Stable  ACTIVITY:  Activity as tolerated  OXYGEN:  Home Oxygen: No.   Oxygen Delivery: room air  DISCHARGE LOCATION:  home   If you experience worsening of your admission symptoms, develop shortness of breath, life threatening emergency, suicidal or homicidal thoughts you must seek medical attention immediately by calling 911 or calling your MD immediately  if symptoms less severe.  You Must read complete instructions/literature along with all the possible adverse reactions/side effects for all the Medicines you take and that have been prescribed to you. Take any new Medicines after you have completely understood and accpet all the possible adverse reactions/side effects.   Please note  You were cared for by a hospitalist during your hospital stay. If you have any questions about your discharge medications or the care you received while you were in the hospital after you are discharged, you can call the unit and asked to speak with the hospitalist on call if the hospitalist that took care of you is not available. Once you are discharged, your primary care physician  will handle any further medical issues. Please note that NO REFILLS for any discharge medications will be authorized once you are discharged, as it is imperative that you return to your primary care physician (or establish a relationship with a primary care physician if you do not have one) for your aftercare needs so that they can reassess your need for medications and monitor  your lab values.     Today   No fever overnight, no new complaints or events overnight. WBC count normalized.    VITAL SIGNS:  Blood pressure 100/66, pulse 73, temperature 98.4 F (36.9 C), temperature source Oral, resp. rate 18, height 5\' 1"  (1.549 m), weight 51 kg (112 lb 8 oz), last menstrual period 01/29/2017, SpO2 100 %.  I/O:   Intake/Output Summary (Last 24 hours) at 02/07/17 1349 Last data filed at 02/07/17 0932  Gross per 24 hour  Intake          1799.17 ml  Output                0 ml  Net          1799.17 ml    PHYSICAL EXAMINATION:   GENERAL:  36 y.o.-year-old patient lying in the bed with no acute distress.  EYES: Pupils equal, round, reactive to light and accommodation. No scleral icterus. Extraocular muscles intact.  HEENT: Head atraumatic, normocephalic. Oropharynx and nasopharynx clear.  NECK:  Supple, no jugular venous distention. No thyroid enlargement, no tenderness.  LUNGS: Normal breath sounds bilaterally, no wheezing, rales,rhonchi. No use of accessory muscles of respiration.  CARDIOVASCULAR: S1, S2 normal. No murmurs, rubs, or gallops.  ABDOMEN: Soft, non-tender, non-distended. Bowel sounds present. No organomegaly or mass.  EXTREMITIES: No pedal edema, cyanosis, or clubbing.  NEUROLOGIC: Cranial nerves II through XII are intact. No focal motor or sensory defecits b/l.  PSYCHIATRIC: The patient is alert and oriented x 3. Good affect.  SKIN: No obvious rash, lesion, or ulcer.   DATA REVIEW:   CBC  Recent Labs Lab 02/07/17 1012  WBC 9.7  HGB 11.6*  HCT 34.4*  PLT 387    Chemistries   Recent Labs Lab 02/06/17 0437  NA 138  K 3.8  CL 110  CO2 21*  GLUCOSE 91  BUN 6  CREATININE 0.73  CALCIUM 7.9*    Cardiac Enzymes  Recent Labs Lab 02/05/17 2240  TROPONINI <0.03    Microbiology Results  Results for orders placed or performed during the hospital encounter of 02/05/17  Blood culture (routine x 2)     Status: None (Preliminary  result)   Collection Time: 02/05/17  9:38 PM  Result Value Ref Range Status   Specimen Description BLOOD R FOREARM  Final   Special Requests BOTTLES DRAWN AEROBIC AND ANAEROBIC BCLV  Final   Culture NO GROWTH 2 DAYS  Final   Report Status PENDING  Incomplete  Blood culture (routine x 2)     Status: None (Preliminary result)   Collection Time: 02/05/17 10:09 PM  Result Value Ref Range Status   Specimen Description BLOOD  L Hand  Final   Special Requests   Final    BOTTLES DRAWN AEROBIC AND ANAEROBIC  AER 6 ML ANA 5 ML   Culture NO GROWTH 2 DAYS  Final   Report Status PENDING  Incomplete  Culture, group A strep     Status: None (Preliminary result)   Collection Time: 02/06/17  3:00 AM  Result Value Ref Range Status   Specimen Description  THROAT  Final   Special Requests NONE  Final   Culture   Final    CULTURE REINCUBATED FOR BETTER GROWTH Performed at [facility], [address], [place], [place] [address]    Report Status PENDING  Incomplete  MRSA PCR Screening     Status: Abnormal   Collection Time: 02/06/17 11:41 AM  Result Value Ref Range Status   MRSA by PCR POSITIVE (A) NEGATIVE Final    Comment:        The GeneXpert MRSA Assay (FDA approved for NASAL specimens only), is one component of a comprehensive MRSA colonization surveillance program. It is not intended to diagnose MRSA infection nor to guide or monitor treatment for MRSA infections. RESULT CALLED TO, READ BACK BY AND VERIFIED WITH: EMILY HERRON 02/06/17 @ 1510  MLK     RADIOLOGY:  Dg Chest 2 View  Result Date: 02/05/2017 CLINICAL DATA:  Fever, nausea, and body aches for 1 week. EXAM: CHEST  2 VIEW COMPARISON:  07/03/2005 FINDINGS: Mild hyperinflation. Normal heart size and pulmonary vascularity. No focal airspace disease or consolidation in the lungs. No blunting of costophrenic angles. No pneumothorax. Mediastinal contours appear intact. IMPRESSION: No active cardiopulmonary disease. Electronically  Signed   By: Burman Nieves M.D.   On: 02/05/2017 22:59      Management plans discussed with the patient, family and they are in agreement.  CODE STATUS:     Code Status Orders        Start     Ordered   02/06/17 0029  Full code  Continuous     02/06/17 0028    Code Status History    Date Active Date Inactive Code Status Order ID Comments User Context   06/18/2014  7:11 PM 06/22/2014  7:29 PM Full Code [id number]  Vale Haven, MD Inpatient   06/14/2014  6:50 PM 06/18/2014  7:11 PM Full Code [id number]  Allie Bossier, MD Inpatient   07/25/2013  1:22 PM 07/25/2013  6:10 PM Full Code [id number]  Carlyle Dolly, PA-C ED   05/30/2012  3:25 AM 05/31/2012  4:08 PM Full Code [id number]  Heather Anne Shutter, PA-C ED      TOTAL TIME TAKING CARE OF THIS PATIENT: 40 minutes.    Houston Siren M.D on 02/07/2017 at 1:49 PM  Between 7am to 6pm - Pager - [phone]  After 6pm go to [email or web address] - Scientist, research (life sciences) [facility]  Office  [phone]  CC: Primary care physician; No PCP Per Patient

## 2017-02-07 NOTE — Care Management (Signed)
Patient to discharge today back to [facility].  ODC and Medication Management application provided.  Patient to discharge on Bactrim on $4 list at [facility].  RNCM signing off.

## 2017-02-07 NOTE — Progress Notes (Signed)
02/07/2017 3:13 PM  BP 100/66 (BP Location: Right Arm)   Pulse 73   Temp 98.4 F (36.9 C) (Oral)   Resp 18   Ht 5\' 1"  (1.549 m)   Wt 51 kg (112 lb 8 oz) Comment: bed scale (2 pillows, 1 blanket, 1 comforter)  LMP 01/29/2017 (Approximate)   SpO2 100%   BMI 21.26 kg/m  Patient discharged per MD orders. Discharge instructions reviewed with patient and patient verbalized understanding. IV removed per policy. Prescriptions discussed and given to patient. Discharged ambulatory escorted by [facility] staff.  Ron ParkerHerron, Diesha Rostad D, RN

## 2017-02-08 LAB — CULTURE, GROUP A STREP (THRC)

## 2017-02-08 LAB — URINE CULTURE: CULTURE: NO GROWTH

## 2017-02-10 LAB — CULTURE, BLOOD (ROUTINE X 2)
CULTURE: NO GROWTH
Culture: NO GROWTH

## 2017-02-14 ENCOUNTER — Emergency Department (HOSPITAL_BASED_OUTPATIENT_CLINIC_OR_DEPARTMENT_OTHER)
Admission: EM | Admit: 2017-02-14 | Discharge: 2017-02-14 | Disposition: A | Discharge status: Home / Self Care | Payer: Medicaid Other | Attending: Emergency Medicine | Admitting: Emergency Medicine

## 2017-02-14 ENCOUNTER — Encounter (HOSPITAL_BASED_OUTPATIENT_CLINIC_OR_DEPARTMENT_OTHER): Payer: Self-pay | Admitting: *Deleted

## 2017-02-14 DIAGNOSIS — Z046 Encounter for general psychiatric examination, requested by authority: Secondary | ICD-10-CM | POA: Insufficient documentation

## 2017-02-14 DIAGNOSIS — Z79899 Other long term (current) drug therapy: Secondary | ICD-10-CM | POA: Insufficient documentation

## 2017-02-14 DIAGNOSIS — F17210 Nicotine dependence, cigarettes, uncomplicated: Secondary | ICD-10-CM | POA: Insufficient documentation

## 2017-02-14 DIAGNOSIS — Z008 Encounter for other general examination: Secondary | ICD-10-CM

## 2017-02-14 LAB — CBC WITH DIFFERENTIAL/PLATELET
BASOS ABS: 0 10*3/uL (ref 0.0–0.1)
BASOS PCT: 1 %
EOS PCT: 1 %
Eosinophils Absolute: 0.1 10*3/uL (ref 0.0–0.7)
HCT: 36.8 % (ref 36.0–46.0)
Hemoglobin: 11.6 g/dL — ABNORMAL LOW (ref 12.0–15.0)
LYMPHS PCT: 51 %
Lymphs Abs: 3 10*3/uL (ref 0.7–4.0)
MCH: 25.3 pg — ABNORMAL LOW (ref 26.0–34.0)
MCHC: 31.5 g/dL (ref 30.0–36.0)
MCV: 80.2 fL (ref 78.0–100.0)
MONO ABS: 0.7 10*3/uL (ref 0.1–1.0)
Monocytes Relative: 13 %
Neutro Abs: 2 10*3/uL (ref 1.7–7.7)
Neutrophils Relative %: 34 %
PLATELETS: 332 10*3/uL (ref 150–400)
RBC: 4.59 MIL/uL (ref 3.87–5.11)
RDW: 13.9 % (ref 11.5–15.5)
WBC: 5.9 10*3/uL (ref 4.0–10.5)

## 2017-02-14 LAB — COMPREHENSIVE METABOLIC PANEL
ALT: 13 U/L — AB (ref 14–54)
AST: 22 U/L (ref 15–41)
Albumin: 3.2 g/dL — ABNORMAL LOW (ref 3.5–5.0)
Alkaline Phosphatase: 49 U/L (ref 38–126)
Anion gap: 5 (ref 5–15)
BILIRUBIN TOTAL: 0.4 mg/dL (ref 0.3–1.2)
BUN: 11 mg/dL (ref 6–20)
CO2: 29 mmol/L (ref 22–32)
CREATININE: 0.64 mg/dL (ref 0.44–1.00)
Calcium: 8.7 mg/dL — ABNORMAL LOW (ref 8.9–10.3)
Chloride: 102 mmol/L (ref 101–111)
GFR calc Af Amer: >60 mL/min (ref >60)
Glucose, Bld: 113 mg/dL — ABNORMAL HIGH (ref 65–99)
Potassium: 4.1 mmol/L (ref 3.5–5.1)
Sodium: 136 mmol/L (ref 135–145)
TOTAL PROTEIN: 6.8 g/dL (ref 6.5–8.1)

## 2017-02-14 LAB — ETHANOL

## 2017-02-14 LAB — PREGNANCY, URINE: Preg Test, Ur: NEGATIVE

## 2017-02-14 NOTE — Discharge Instructions (Signed)
YOUR LAB WORK WAS NORMAL. NO EVIDENCE OF INFECTION OR DEHYDRATION. RETURN IMMEDIATELY IF YOU DEVELOP UNEXPLAINED FEVER OR SIGNS/SYMPTOMS OF SKIN INFECTION.

## 2017-02-14 NOTE — ED Notes (Signed)
Pt states she is here to be cleared for [facility], she had been told she had MRSA two weeks ago

## 2017-02-14 NOTE — ED Triage Notes (Signed)
Pt states she needs medical clearance for [facility], where they are holding a bed.

## 2017-02-14 NOTE — ED Provider Notes (Signed)
[facility]-EMERGENCY DEPT Provider Note   CSN: [id number] Arrival date & time: 02/14/17  1530   By signing my name below, I, Talbert NanPaul Grant, attest that this documentation has been prepared under the direction and in the presence of Laurence Spatesachel Morgan Dareth Andrew, MD. Electronically Signed: Talbert NanPaul Grant, Scribe. 02/14/17. 6:38 PM.  History   Chief Complaint Chief Complaint  Patient presents with  . Medical Clearance    HPI Briana Russianshley K Zhang is a 36 y.o. female who presents to the Emergency Department present for medical clearance. [facility]. Pt is a IV drug user. Pt has not been sick since she had mrsa infection previously. Pt denies any recent fevers, problems with skin infections, or other complaints.   The history is provided by the patient. No language interpreter was used.    Past Medical History:  Diagnosis Date  . Bipolar 1 disorder (HCC)   . Cellulitis and abscess   . Paranoid schizophrenia (HCC)   . Substance abuse     Patient Active Problem List   Diagnosis Date Noted  . Sepsis (HCC) 02/05/2017  . S/P C-section 06/18/2014  . Placental abruption in third trimester 06/14/2014  . Supervision of high risk pregnancy due to social problems in third trimester 05/27/2014  . 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 CESAREAN SECTION    . CESAREAN SECTION N/A 06/18/2014   Procedure: CESAREAN SECTION;  Surgeon: Adam PhenixJames G Arnold, MD;  Location: [facility];  Service: Obstetrics;  Laterality: N/A;  . WISDOM TOOTH EXTRACTION      OB History    Gravida Para Term Preterm AB Living   4 3 2 1 1 3    SAB TAB Ectopic Multiple Live Births     1     3       Home Medications    Prior to Admission medications   Medication Sig Start Date End Date Taking? Authorizing Provider  sulfamethoxazole-trimethoprim (BACTRIM DS,SEPTRA DS) 800-160 MG tablet Take 1 tablet by mouth 2 (two) times daily.   Yes Historical Provider, MD  FLUoxetine  (PROZAC) 20 MG capsule Take 20 mg by mouth daily.    Historical Provider, MD  gabapentin (NEURONTIN) 300 MG capsule Take 600 mg by mouth 2 (two) times daily.    Historical Provider, MD    Family History Family History  Problem Relation Age of Onset  . Cancer Mother   . Depression Mother   . Diabetes Father   . Cancer Maternal Grandmother   . Cancer Maternal Grandfather     Social History Social History  Substance Use Topics  . Smoking status: Current Every Day Smoker    Packs/day: 0.50    Types: Cigarettes  . Smokeless tobacco: Never Used  . Alcohol use No     Allergies   Ciprofloxacin   Review of Systems Review of Systems  A complete 10 system review of systems was obtained and all systems are negative except as noted in the HPI and PMH.   Physical Exam Updated Vital Signs BP 94/57 (BP Location: Right Arm)   Pulse 70   Temp 97.9 F (36.6 C) (Oral)   Resp 18   Ht 5\' 1"  (1.549 m)   Wt 112 lb (50.8 kg)   LMP 01/29/2017 (Approximate)   SpO2 100%   BMI 21.16 kg/m   Physical Exam  Constitutional: She is oriented to person, place, and time. She appears well-developed and well-nourished. No distress.  HENT:  Head: Normocephalic and atraumatic.  Mouth/Throat: Oropharynx is clear and moist.  Moist mucous membranes  Eyes: Conjunctivae are normal. Pupils are equal, round, and reactive to light.  Neck: Neck supple.  Cardiovascular: Normal rate, regular rhythm and normal heart sounds.   No murmur heard. Pulmonary/Chest: Effort normal and breath sounds normal.  Abdominal: Soft. Bowel sounds are normal. She exhibits no distension. There is no tenderness.  Musculoskeletal: She exhibits no edema.  Neurological: She is alert and oriented to person, place, and time.  Fluent speech  Skin: Skin is warm and dry. No erythema.  Multiple track marks on bilateral arms and hands.  Psychiatric: She has a normal mood and affect. Judgment normal.  Nursing note and vitals  reviewed.    ED Treatments / Results   DIAGNOSTIC STUDIES: Oxygen Saturation is 100% on room air, normal by my interpretation.    COORDINATION OF CARE: 6:39 PM Discussed treatment plan with pt at bedside and pt agreed to plan.  Labs (all labs ordered are listed, but only abnormal results are displayed) Labs Reviewed  COMPREHENSIVE METABOLIC PANEL - Abnormal; Notable for the following:       Result Value   Glucose, Bld 113 (*)    Calcium 8.7 (*)    Albumin 3.2 (*)    ALT 13 (*)    All other components within normal limits  CBC WITH DIFFERENTIAL/PLATELET - Abnormal; Notable for the following:    Hemoglobin 11.6 (*)    MCH 25.3 (*)    All other components within normal limits  ETHANOL  PREGNANCY, URINE    EKG  EKG Interpretation None       Radiology No results found.  Procedures Procedures (including critical care time)  Medications Ordered in ED Medications - No data to display   Initial Impression / Assessment and Plan / ED Course  I have reviewed the triage vital signs and the nursing notes.  Pertinent labs that were available during my care of the patient were reviewed by me and considered in my medical decision making (see chart for details).    Patient sent from [facility] for medical clearance. No complaints, well appearing w/ reassuring VS. no recent infectious sx. Labs are reassuring and pt is medically clear for psychiatric treatment. Will discharge back to [facility] for detox.   Final Clinical Impressions(s) / ED Diagnoses   Final diagnoses:  Medical clearance for psychiatric admission    New Prescriptions Discharge Medication List as of 02/14/2017  7:47 PM     I personally performed the services described in this documentation, which was scribed in my presence. The recorded information has been reviewed and is accurate.     Laurence Spates, MD 02/17/17 [phone]

## 2017-02-28 ENCOUNTER — Emergency Department (HOSPITAL_BASED_OUTPATIENT_CLINIC_OR_DEPARTMENT_OTHER)
Admission: EM | Admit: 2017-02-28 | Discharge: 2017-02-28 | Disposition: A | Discharge status: Home / Self Care | Payer: Medicaid Other | Attending: Emergency Medicine | Admitting: Emergency Medicine

## 2017-02-28 ENCOUNTER — Encounter (HOSPITAL_BASED_OUTPATIENT_CLINIC_OR_DEPARTMENT_OTHER): Payer: Self-pay | Admitting: Emergency Medicine

## 2017-02-28 DIAGNOSIS — F17210 Nicotine dependence, cigarettes, uncomplicated: Secondary | ICD-10-CM | POA: Insufficient documentation

## 2017-02-28 DIAGNOSIS — F1490 Cocaine use, unspecified, uncomplicated: Secondary | ICD-10-CM | POA: Insufficient documentation

## 2017-02-28 DIAGNOSIS — B9689 Other specified bacterial agents as the cause of diseases classified elsewhere: Secondary | ICD-10-CM

## 2017-02-28 DIAGNOSIS — F1290 Cannabis use, unspecified, uncomplicated: Secondary | ICD-10-CM | POA: Insufficient documentation

## 2017-02-28 DIAGNOSIS — N760 Acute vaginitis: Secondary | ICD-10-CM | POA: Insufficient documentation

## 2017-02-28 LAB — WET PREP, GENITAL
SPERM: NONE SEEN
Trich, Wet Prep: NONE SEEN
Yeast Wet Prep HPF POC: NONE SEEN

## 2017-02-28 LAB — URINALYSIS, ROUTINE W REFLEX MICROSCOPIC
Bilirubin Urine: NEGATIVE
GLUCOSE, UA: NEGATIVE mg/dL
HGB URINE DIPSTICK: NEGATIVE
Ketones, ur: NEGATIVE mg/dL
Leukocytes, UA: NEGATIVE
Nitrite: NEGATIVE
PH: 6.5 (ref 5.0–8.0)
Protein, ur: NEGATIVE mg/dL
SPECIFIC GRAVITY, URINE: 1.009 (ref 1.005–1.030)

## 2017-02-28 LAB — PREGNANCY, URINE: Preg Test, Ur: NEGATIVE

## 2017-02-28 MED ORDER — METRONIDAZOLE 500 MG PO TABS: 500.0000 mg | ORAL_TABLET | Freq: Two times a day (BID) | ORAL | 0 refills | Status: DC

## 2017-02-28 NOTE — ED Notes (Signed)
Pt unable to give UA in triage

## 2017-02-28 NOTE — ED Notes (Signed)
Pt was given salting crackers with cheese and coke as a drink.

## 2017-02-28 NOTE — ED Triage Notes (Signed)
Pt having vaginal discharge, odor for two weeks.  Pt recently treated for UTI and was on abx.

## 2017-02-28 NOTE — ED Provider Notes (Signed)
[facility] Provider Note   CSN: [id number] Arrival date & time: 02/28/17  1040     History   Chief Complaint Chief Complaint  Patient presents with  . Vaginal Discharge    HPI Briana Russianshley K Zhang is a 36 y.o. female.  The history is provided by the patient and medical records.  Vaginal Discharge    36 y.o. F with hx of bipolar disorder, schizophrenia, substance abuse, presenting to the ED for vaginal discharge x2 weeks.  Patient reports she was recently treated for UTI with abx about a month ago.  No discharge at that time.  Denies current urinary symptoms.  No fever, chills, flank pain, abdominal pain, or pelvic pain.  No irregular bleeding.  Hx of BV in the past with similar symptoms.  No new sexual partners or concern for STD currently.  Past Medical History:  Diagnosis Date  . Bipolar 1 disorder (HCC)   . Cellulitis and abscess   . Paranoid schizophrenia (HCC)   . Substance abuse     Patient Active Problem List   Diagnosis Date Noted  . Sepsis (HCC) 02/05/2017  . S/P C-section 06/18/2014  . Placental abruption in third trimester 06/14/2014  . Supervision of high risk pregnancy due to social problems in third trimester 05/27/2014  . 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 CESAREAN SECTION    . CESAREAN SECTION N/A 06/18/2014   Procedure: CESAREAN SECTION;  Surgeon: Adam PhenixJames G Arnold, MD;  Location: [facility];  Service: Obstetrics;  Laterality: N/A;  . WISDOM TOOTH EXTRACTION      OB History    Gravida Para Term Preterm AB Living   4 3 2 1 1 3    SAB TAB Ectopic Multiple Live Births     1     3       Home Medications    Prior to Admission medications   Medication Sig Start Date End Date Taking? Authorizing Provider  FLUoxetine (PROZAC) 20 MG capsule Take 20 mg by mouth daily.    Historical Provider, MD  gabapentin (NEURONTIN) 300 MG capsule Take 600 mg by mouth 2 (two) times daily.    Historical  Provider, MD  sulfamethoxazole-trimethoprim (BACTRIM DS,SEPTRA DS) 800-160 MG tablet Take 1 tablet by mouth 2 (two) times daily.    Historical Provider, MD    Family History Family History  Problem Relation Age of Onset  . Cancer Mother   . Depression Mother   . Diabetes Father   . Cancer Maternal Grandmother   . Cancer Maternal Grandfather     Social History Social History  Substance Use Topics  . Smoking status: Current Every Day Smoker    Packs/day: 0.50    Types: Cigarettes  . Smokeless tobacco: Never Used  . Alcohol use No     Allergies   Ciprofloxacin   Review of Systems Review of Systems  Genitourinary: Positive for vaginal discharge.  All other systems reviewed and are negative.    Physical Exam Updated Vital Signs BP 113/70 (BP Location: Left Arm)   Pulse 81   Temp 97.9 F (36.6 C) (Oral)   Resp 18   Ht 5\' 1"  (1.549 m)   Wt 52.2 kg   LMP 01/29/2017 (Approximate)   SpO2 100%   BMI 21.73 kg/m   Physical Exam  Constitutional: She is oriented to person, place, and time. She appears well-developed and well-nourished.  HENT:  Head: Normocephalic and atraumatic.  Mouth/Throat:  Oropharynx is clear and moist.  Eyes: Conjunctivae and EOM are normal. Pupils are equal, round, and reactive to light.  Neck: Normal range of motion.  Cardiovascular: Normal rate, regular rhythm and normal heart sounds.   Pulmonary/Chest: Effort normal and breath sounds normal.  Abdominal: Soft. Bowel sounds are normal.  Genitourinary:  Genitourinary Comments: Exam chaperoned by NT Normal female external genitalia without visible lesions or rash, moderate amount of thick, white vaginal discharge, cervical os is closed, no bleeding, no adnexal or cervical motion tenderness  Musculoskeletal: Normal range of motion.  Neurological: She is alert and oriented to person, place, and time.  Skin: Skin is warm and dry.  Psychiatric: She has a normal mood and affect.  Nursing note and  vitals reviewed.    ED Treatments / Results  Labs (all labs ordered are listed, but only abnormal results are displayed) Labs Reviewed  WET PREP, GENITAL - Abnormal; Notable for the following:       Result Value   Clue Cells Wet Prep HPF POC PRESENT (*)    WBC, Wet Prep HPF POC MODERATE (*)    All other components within normal limits  PREGNANCY, URINE  URINALYSIS, ROUTINE W REFLEX MICROSCOPIC  GC/CHLAMYDIA PROBE AMP ([place]) NOT AT [facility]    EKG  EKG Interpretation None       Radiology No results found.  Procedures Procedures (including critical care time)  Medications Ordered in ED Medications - No data to display   Initial Impression / Assessment and Plan / ED Course  I have reviewed the triage vital signs and the nursing notes.  Pertinent labs & imaging results that were available during my care of the patient were reviewed by me and considered in my medical decision making (see chart for details).  36 year old female here with vaginal discharge. Reports history of BV with similar symptoms. Urinalysis here is negative. Pregnancy test is also negative. Pelvic exam performed, she does have a moderate amount of thick, white vaginal discharge. No adnexal or cervical motion tenderness. Wet prep with clue cells present consistent with BV.  Gc/chl pending.  Will start on flagyl.  She is currently in recovery center, advised to avoid EtOH while taking this.  Discussed plan with patient, she acknowledged understanding and agreed with plan of care.  Return precautions given for new or worsening symptoms.  Final Clinical Impressions(s) / ED Diagnoses   Final diagnoses:  BV (bacterial vaginosis)    New Prescriptions Discharge Medication List as of 02/28/2017  1:12 PM    START taking these medications   Details  metroNIDAZOLE (FLAGYL) 500 MG tablet Take 1 tablet (500 mg total) by mouth 2 (two) times daily., Starting Wed 02/28/2017, Print         Garlon Hatchet,  PA-C 02/28/17 1350    Geoffery Lyons, MD 02/28/17 [phone]

## 2017-02-28 NOTE — ED Notes (Signed)
No answer in waiting room when called.

## 2017-02-28 NOTE — Discharge Instructions (Signed)
Take the prescribed medication as directed.  Do not drink alcohol while taking this, it will make you sick. Follow-up with your primary care doctor if any ongoing issues. Return to the ED for new or worsening symptoms.

## 2017-03-01 LAB — GC/CHLAMYDIA PROBE AMP (~~LOC~~) NOT AT ARMC
Chlamydia: NEGATIVE
Neisseria Gonorrhea: NEGATIVE

## 2018-03-11 ENCOUNTER — Other Ambulatory Visit: Payer: Self-pay

## 2018-03-11 ENCOUNTER — Emergency Department (HOSPITAL_BASED_OUTPATIENT_CLINIC_OR_DEPARTMENT_OTHER)
Admission: EM | Admit: 2018-03-11 | Discharge: 2018-03-11 | Disposition: A | Discharge status: Home / Self Care | Payer: Self-pay | Attending: Emergency Medicine | Admitting: Emergency Medicine

## 2018-03-11 ENCOUNTER — Encounter (HOSPITAL_BASED_OUTPATIENT_CLINIC_OR_DEPARTMENT_OTHER): Payer: Self-pay

## 2018-03-11 DIAGNOSIS — R1030 Lower abdominal pain, unspecified: Secondary | ICD-10-CM | POA: Insufficient documentation

## 2018-03-11 DIAGNOSIS — F17290 Nicotine dependence, other tobacco product, uncomplicated: Secondary | ICD-10-CM | POA: Insufficient documentation

## 2018-03-11 LAB — URINALYSIS, ROUTINE W REFLEX MICROSCOPIC
Bilirubin Urine: NEGATIVE
Glucose, UA: NEGATIVE mg/dL
HGB URINE DIPSTICK: NEGATIVE
Ketones, ur: 15 mg/dL — AB
Leukocytes, UA: NEGATIVE
Nitrite: NEGATIVE
PROTEIN: NEGATIVE mg/dL
Specific Gravity, Urine: >1.03 — ABNORMAL HIGH (ref 1.005–1.030)
pH: 6 (ref 5.0–8.0)

## 2018-03-11 LAB — PREGNANCY, URINE: Preg Test, Ur: NEGATIVE

## 2018-03-11 NOTE — Discharge Instructions (Signed)
It is okay to take ibuprofen as needed for pain.  Return if your condition worsens for any reason.  Call the number on these instructions to arrange to get a primary care physician

## 2018-03-11 NOTE — ED Provider Notes (Signed)
[facility] EMERGENCY DEPARTMENT Provider Note   CSN: [id number] Arrival date & time: 03/11/18  1449     History   Chief Complaint Chief Complaint  Patient presents with  . Abdominal Pain    HPI Briana Zhang is a 37 y.o. female.  HPI Complains of lower back pain rating to lower abdomen, crampy in nature, light amount of discomfort upon awakening this morning felt well last night.  Discomfort became much worse during the day today.  Symptoms have now almost resolved completely.  She treated herself with ibuprofen at 8:30 AM today.  No fever no nausea no vomiting no anorexia.  She started her menstrual period today she normally gets bad menstrual cramps however this was more severe.  She denies any vaginal discharge or urinary symptoms.  Last bowel movement yesterday normal.  No other associated symptoms. Past Medical History:  Diagnosis Date  . Bipolar 1 disorder (HCC)   . Cellulitis and abscess   . Paranoid schizophrenia (HCC)   . Substance abuse [facility])     Patient Active Problem List   Diagnosis Date Noted  . Sepsis (HCC) 02/05/2017  . S/P C-section 06/18/2014  . Placental abruption in third trimester 06/14/2014  . Supervision of high risk pregnancy due to social problems in third trimester 05/27/2014  . 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 CESAREAN SECTION    . CESAREAN SECTION N/A 06/18/2014   Procedure: CESAREAN SECTION;  Surgeon: Adam Phenix, MD;  Location: [facility];  Service: Obstetrics;  Laterality: N/A;  . WISDOM TOOTH EXTRACTION      OB History    Gravida Para Term Preterm AB Living   4 3 2 1 1 3    SAB TAB Ectopic Multiple Live Births     1     3       Home Medications    Prior to Admission medications   Medication Sig Start Date End Date Taking? Authorizing Provider  FLUoxetine (PROZAC) 20 MG capsule Take 20 mg by mouth daily.    [provider]  gabapentin (NEURONTIN)  300 MG capsule Take 600 mg by mouth 2 (two) times daily.    [provider]    Family History Family History  Problem Relation Age of Onset  . Cancer Mother   . Depression Mother   . Diabetes Father   . Cancer Maternal Grandmother   . Cancer Maternal Grandfather     Social History Social History   Tobacco Use  . Smoking status: Current Every Day Smoker    Packs/day: 0.50    Types: E-cigarettes  . Smokeless tobacco: Never Used  Substance Use Topics  . Alcohol use: No  . Drug use: No    Comment: sober x 4 mos     Allergies   Ciprofloxacin   Review of Systems Review of Systems  Gastrointestinal: Positive for abdominal pain.  Genitourinary:       Currently on menses  Musculoskeletal: Positive for back pain.  All other systems reviewed and are negative.    Physical Exam Updated Vital Signs BP 115/70 (BP Location: Left Arm)   Pulse 81   Temp 98.2 F (36.8 C) (Oral)   Resp 18   Ht 5\' 2"  (1.575 m)   Wt 56.4 kg (124 lb 5.4 oz)   LMP 03/11/2018   SpO2 100%   BMI 22.74 kg/m   Physical Exam  Constitutional: She appears well-developed and well-nourished.  HENT:  Head: Normocephalic and atraumatic.  Eyes: Conjunctivae are normal. Pupils are equal, round, and reactive to light.  Neck: Neck supple. No tracheal deviation present. No thyromegaly present.  Cardiovascular: Normal rate and regular rhythm.  No murmur heard. Pulmonary/Chest: Effort normal and breath sounds normal.  Abdominal: Soft. Bowel sounds are normal. She exhibits no distension. There is no tenderness.  Genitourinary:  Genitourinary Comments: No flank tenderness  Musculoskeletal: Normal range of motion. She exhibits no edema or tenderness.  Neurological: She is alert. Coordination normal.  Skin: Skin is warm and dry. No rash noted.  Psychiatric: She has a normal mood and affect.  Nursing note and vitals reviewed.    ED Treatments / Results  Labs (all labs ordered are listed, but  only abnormal results are displayed) Labs Reviewed  URINALYSIS, ROUTINE W REFLEX MICROSCOPIC - Abnormal; Notable for the following components:      Result Value   Specific Gravity, Urine >1.030 (*)    Ketones, ur 15 (*)    All other components within normal limits  PREGNANCY, URINE    EKG  EKG Interpretation None       Radiology No results found.  Procedures Procedures (including critical care time)  Medications Ordered in ED Medications - No data to display  Results for orders placed or performed during the hospital encounter of 03/11/18  Urinalysis, Routine w reflex microscopic  Result Value Ref Range   Color, Urine YELLOW YELLOW   APPearance CLEAR CLEAR   Specific Gravity, Urine >1.030 (H) 1.005 - 1.030   pH 6.0 5.0 - 8.0   Glucose, UA NEGATIVE NEGATIVE mg/dL   Hgb urine dipstick NEGATIVE NEGATIVE   Bilirubin Urine NEGATIVE NEGATIVE   Ketones, ur 15 (A) NEGATIVE mg/dL   Protein, ur NEGATIVE NEGATIVE mg/dL   Nitrite NEGATIVE NEGATIVE   Leukocytes, UA NEGATIVE NEGATIVE  Pregnancy, urine  Result Value Ref Range   Preg Test, Ur NEGATIVE NEGATIVE   No results found. Initial Impression / Assessment and Plan / ED Course  I have reviewed the triage vital signs and the nursing notes.  Pertinent labs & imaging results that were available during my care of the patient were reviewed by me and considered in my medical decision making (see chart for details).     Exam completely benign.  Symptoms resolving spontaneously.  Doubt appendicitis.  Normal exam, no anorexia.  Patient looks close.  Return precautions given.  Final Clinical Impressions(s) / ED Diagnoses  Diagnosis lower abdominal pain Final diagnoses:  Lower abdominal pain    ED Discharge Orders    None       Doug SouJacubowitz, Domenik Trice, MD 03/11/18 [phone]

## 2018-03-11 NOTE — ED Triage Notes (Signed)
C/o lower abd/lower back pain x today-states she started her period today-NAD-steady gait

## 2018-03-11 NOTE — ED Notes (Signed)
ED Provider at bedside. 

## 2018-03-11 NOTE — ED Notes (Signed)
NAD at this time. Pt is stable and going home.  

## 2018-10-23 ENCOUNTER — Encounter: Payer: Self-pay | Admitting: Family

## 2018-10-23 ENCOUNTER — Ambulatory Visit (INDEPENDENT_AMBULATORY_CARE_PROVIDER_SITE_OTHER): Payer: Self-pay | Admitting: Family

## 2018-10-23 VITALS — BP 104/69 | HR 65 | Temp 98.0°F | Ht 63.0 in | Wt 127.0 lb

## 2018-10-23 DIAGNOSIS — R768 Other specified abnormal immunological findings in serum: Secondary | ICD-10-CM

## 2018-10-23 DIAGNOSIS — B182 Chronic viral hepatitis C: Secondary | ICD-10-CM | POA: Insufficient documentation

## 2018-10-23 NOTE — Assessment & Plan Note (Signed)
Briana Zhang has a hepatitis C antibody positive blood test and was initially positive in 2014 with a history of IV drug use.  She is currently asymptomatic and has not received treatment for hepatitis C in the past.  No imaging is available for review.  We discussed the transmission of hepatitis C, prevention, risks if left untreated, and medications in detail.  Check hepatitis C viral load to determine if treatment is necessary.  She will need financial assistance for both office visit and lab work going forward.  She met with our pharmacy staff today for support path paperwork.  Follow-up pending hepatitis C viral load results.

## 2018-10-23 NOTE — Patient Instructions (Signed)
Nice to meet you.  We will check your hepatitis C viral load today.  Pending the results we may need additional blood work/imaging to complete the requirements to obtain medication.  Limit acetaminophen (Tylenol) usage to no more than 2 grams (2,000 mg) per day.  Avoid alcohol.  Do not share toothbrushes or razors.  Practice safe sex to protect against transmission as well as sexually transmitted disease.    Hepatitis C Hepatitis C is a viral infection of the liver. It can lead to scarring of the liver (cirrhosis), liver failure, or liver cancer. Hepatitis C may go undetected for months or years because people with the infection may not have symptoms, or they may have only mild symptoms. What are the causes? This condition is caused by the hepatitis C virus (HCV). The virus can spread from person to person (is contagious) through:  Blood.  Childbirth. A woman who has hepatitis C can pass it to her baby during birth.  Bodily fluids, such as breast milk, tears, semen, vaginal fluids, and saliva.  Blood transfusions or organ transplants done in the [place] before 1992.  What increases the risk? The following factors may make you more likely to develop this condition:  Having contact with unclean (contaminated) needles or syringes. This may result from: ? Acupuncture. ? Tattoing. ? Body piercing. ? Injecting drugs.  Having unprotected sex with someone who is infected.  Needing treatment to filter your blood (kidney dialysis).  Having HIV (human immunodeficiency virus) or AIDS (acquired immunodeficiency syndrome).  Working in a job that involves contact with blood or bodily fluids, such as health care.  What are the signs or symptoms? Symptoms of this condition include:  Fatigue.  Loss of appetite.  Nausea.  Vomiting.  Abdominal pain.  Dark yellow urine.  Yellowish skin and eyes (jaundice).  Itchy skin.  Clay-colored bowel movements.  Joint  pain.  Bleeding and bruising easily.  Fluid building up in your stomach (ascites).  In some cases, you may not have any symptoms. How is this diagnosed? This condition is diagnosed with:  Blood tests.  Other tests to check how well your liver is functioning. They may include: ? Magnetic resonance elastography (MRE). This imaging test uses MRIs and sound waves to measure liver stiffness. ? Transient elastography. This imaging test uses ultrasounds to measure liver stiffness. ? Liver biopsy. This test requires taking a small tissue sample from your liver to examine it under a microscope.  How is this treated? Your health care provider may perform noninvasive tests or a liver biopsy to help decide the best course of treatment. Treatment may include:  Antiviral medicines and other medicines.  Follow-up treatments every 6-12 months for infections or other liver conditions.  Receiving a donated liver (liver transplant).  Follow these instructions at home: Medicines  Take over-the-counter and prescription medicines only as told by your health care provider.  Take your antiviral medicine as told by your health care provider. Do not stop taking the antiviral even if you start to feel better.  Do not take any medicines unless approved by your health care provider, including over-the-counter medicines and birth control pills. Activity  Rest as needed.  Do not have sex unless approved by your health care provider.  Ask your health care provider when you may return to school or work. Eating and drinking  Eat a balanced diet with plenty of fruits and vegetables, whole grains, and lowfat (lean) meats or non-meat proteins (such as beans or tofu).  Drink enough fluids to keep your urine clear or pale yellow.  Do not drink alcohol. General instructions  Do not share toothbrushes, nail clippers, or razors.  Wash your hands frequently with soap and water. If soap and water are not  available, use hand sanitizer.  Cover any cuts or open sores on your skin to prevent spreading the virus.  Keep all follow-up visits as told by your health care provider. This is important. You may need follow-up visits every 6-12 months. How is this prevented? There is no vaccine for hepatitis C. The only way to prevent the disease is to reduce the risk of exposure to the virus. Make sure you:  Wash your hands frequently with soap and water. If soap and water are not available, use hand sanitizer.  Do not share needles or syringes.  Practice safe sex and use condoms.  Avoid handling blood or bodily fluids without gloves or other protection.  Avoid getting tattoos or piercings in shops or other locations that are not clean.  Contact a health care provider if:  You have a fever.  You develop abdominal pain.  You pass dark urine.  You pass clay-colored stools.  You develop joint pain. Get help right away if:  You have increasing fatigue or weakness.  You lose your appetite.  You cannot eat or drink without vomiting.  You develop jaundice or your jaundice gets worse.  You bruise or bleed easily. Summary  Hepatitis C is a viral infection of the liver. It can lead to scarring of the liver (cirrhosis), liver failure, or liver cancer.  The hepatitis C virus (HCV) causes this condition. The virus can pass from person to person (is contagious).  You should not take any medicines unless approved by your health care provider. This includes over-the-counter medicines and birth control pills. This information is not intended to replace advice given to you by your health care provider. Make sure you discuss any questions you have with your health care provider. Document Released: 12/08/2000 Document Revised: 01/16/2017 Document Reviewed: 01/16/2017 Elsevier Interactive Patient Education  [employer].

## 2018-10-23 NOTE — Progress Notes (Signed)
Subjective:    Patient ID: Briana Zhang, female    DOB: 1981/12/09, 37 y.o.   MRN: [id number]  Chief Complaint  Patient presents with  . New Patient (Initial Visit)    Hep C    HPI:  [name] is a 37 y.o. female who presents today for initial evaluation and treatment of Hepatitis C.  [name] had a Hepatitis C positive antibody test in May of 2015. Risk factors include IVDU. Denied blood transfusions before 1992, tattoos, sharing of razors/toothbrushes, or sexual contact with an infected partner. No personal or family history of liver disease. She has been sober for 11 months from substance abuse with multiple substances. She does continue to vape at present. She has not received any treatment to date. Lab work from PCP reviewed with positive Hepatitis C antibody test. No further blood work or imaging is available at present.    Allergies  Allergen Reactions  . Ciprofloxacin Swelling and Rash      Outpatient Medications Prior to Visit  Medication Sig Dispense Refill  . FLUoxetine (PROZAC) 20 MG capsule Take 20 mg by mouth daily.    Marland Kitchen gabapentin (NEURONTIN) 300 MG capsule Take 600 mg by mouth 2 (two) times daily.     No facility-administered medications prior to visit.      Past Medical History:  Diagnosis Date  . Bipolar 1 disorder ([place])   . Cellulitis and abscess   . Paranoid schizophrenia ([place])   . Substance abuse ([place])    IVDU mulitple substances      Past Surgical History:  Procedure Laterality Date  . CESAREAN SECTION    . CESAREAN SECTION N/A 06/18/2014   Procedure: CESAREAN SECTION;  Surgeon: Woodroe Mode, MD;  Location: [place] [facility];  Service: Obstetrics;  Laterality: N/A;  . WISDOM TOOTH EXTRACTION        Family History  Problem Relation Age of Onset  . Cancer Mother   . Depression Mother   . Diabetes Father   . Cancer Maternal Grandmother   . Cancer Maternal Grandfather       Social History   Socioeconomic History  . Marital  status: Single    Spouse name: Not on file  . Number of children: Not on file  . Years of education: Not on file  . Highest education level: Not on file  Occupational History  . Occupation: Therapist, nutritional   Social Needs  . Financial resource strain: Not on file  . Food insecurity:    Worry: Not on file    Inability: Not on file  . Transportation needs:    Medical: Not on file    Non-medical: Not on file  Tobacco Use  . Smoking status: Current Every Day Smoker    Packs/day: 0.50    Types: E-cigarettes  . Smokeless tobacco: Never Used  Substance and Sexual Activity  . Alcohol use: No  . Drug use: No    Types: Heroin    Comment: sober x 11 monrhs  . Sexual activity: Yes    Birth control/protection: None  Lifestyle  . Physical activity:    Days per week: Not on file    Minutes per session: Not on file  . Stress: Not on file  Relationships  . Social connections:    Talks on phone: Not on file    Gets together: Not on file    Attends religious service: Not on file    Active member of club or organization: Not  on file    Attends meetings of clubs or organizations: Not on file    Relationship status: Not on file  . Intimate partner violence:    Fear of current or ex partner: Not on file    Emotionally abused: Not on file    Physically abused: Not on file    Forced sexual activity: Not on file  Other Topics Concern  . Not on file  Social History Narrative  . Not on file      Review of Systems  Constitutional: Negative for chills, fatigue, fever and unexpected weight change.  Respiratory: Negative for cough, chest tightness, shortness of breath and wheezing.   Cardiovascular: Negative for chest pain and leg swelling.  Gastrointestinal: Negative for abdominal distention, constipation, diarrhea, nausea and vomiting.  Neurological: Negative for dizziness, weakness, light-headedness and headaches.  Hematological: Does not bruise/bleed easily.       Objective:    BP  104/69   Pulse 65   Temp 98 F (36.7 C)   Ht '5\' 3"'  (1.6 m)   Wt 127 lb (57.6 kg)   LMP 08/31/2018   BMI 22.50 kg/m  Nursing note and vital signs reviewed.  Physical Exam  Constitutional: She is oriented to person, place, and time. She appears well-developed. No distress.  Cardiovascular: Normal rate, regular rhythm, normal heart sounds and intact distal pulses. Exam reveals no gallop and no friction rub.  No murmur heard. Pulmonary/Chest: Effort normal and breath sounds normal. No respiratory distress. She has no wheezes. She has no rales. She exhibits no tenderness.  Abdominal: Soft. Bowel sounds are normal. She exhibits no distension, no ascites and no mass. There is no hepatosplenomegaly. There is no tenderness. There is no rebound and no guarding.  Neurological: She is alert and oriented to person, place, and time.  Skin: Skin is warm and dry.  Psychiatric: She has a normal mood and affect. Her behavior is normal. Judgment and thought content normal.        Assessment & Plan:   Problem List Items Addressed This Visit      Other   Hepatitis C antibody positive in blood - Primary    [name] has a hepatitis C antibody positive blood test and was initially positive in 2014 with a history of IV drug use.  She is currently asymptomatic and has not received treatment for hepatitis C in the past.  No imaging is available for review.  We discussed the transmission of hepatitis C, prevention, risks if left untreated, and medications in detail.  Check hepatitis C viral load to determine if treatment is necessary.  She will need financial assistance for both office visit and lab work going forward.  She met with our pharmacy staff today for support path paperwork.  Follow-up pending hepatitis C viral load results.      Relevant Orders   Hepatitis C RNA quantitative       I am having Rennis Petty maintain her FLUoxetine and gabapentin.   Follow-up: Return if symptoms worsen or  fail to improve.    Terri Piedra, MSN, FNP-C Nurse Practitioner [facility] Office phone: [phone] Pager: [place] number: [phone]

## 2018-10-26 LAB — HEPATITIS C RNA QUANTITATIVE
HCV Quantitative Log: 4.58 Log IU/mL — ABNORMAL HIGH
HCV RNA, PCR, QN: 37800 IU/mL — ABNORMAL HIGH

## 2018-10-28 ENCOUNTER — Other Ambulatory Visit: Payer: Self-pay | Admitting: Family

## 2018-10-28 ENCOUNTER — Telehealth: Payer: Self-pay | Admitting: Behavioral Health

## 2018-10-28 DIAGNOSIS — R768 Other specified abnormal immunological findings in serum: Secondary | ICD-10-CM

## 2018-10-28 NOTE — Telephone Encounter (Signed)
-----   Message from Veryl Speak, FNP sent at 10/28/2018  3:01 PM EST ----- Please inform Briana Zhang that there is a viral load present for Hepatitis C and she will need to stop by the lab to have additional blood work completed as well as schedule a time for an ultrasound.  (Orders have been placed.)

## 2018-10-28 NOTE — Telephone Encounter (Signed)
Called patient left voicemail to all the office back and left the call back number   Patient has a U/S elastography scheduled Tuesday 11/05/2018 arrive at 8:45 at [facility] - no food or drink after midnight [facility] [employer]

## 2018-10-29 NOTE — Telephone Encounter (Signed)
Called Briana Zhang, verified identity, Informed her per Marcos Eke that a viral load was present for Hep C and she will need additional lab work. Also informed her she has an U/S with elastography scheduled for Tuesday  11/05/2018 at 8:45 at [facility].  No food or drink after midnight before the U/S.  Address given as well.  Lab appointment scheduled for 11/05/2018 after patient's U/S. Angeline Slim RN

## 2018-11-05 ENCOUNTER — Ambulatory Visit (HOSPITAL_COMMUNITY)
Admission: RE | Admit: 2018-11-05 | Discharge: 2018-11-05 | Disposition: A | Discharge status: Home / Self Care | Payer: Self-pay | Source: Ambulatory Visit | Attending: Family | Admitting: Family

## 2018-11-05 ENCOUNTER — Other Ambulatory Visit: Payer: Self-pay

## 2018-11-05 DIAGNOSIS — R768 Other specified abnormal immunological findings in serum: Secondary | ICD-10-CM | POA: Insufficient documentation

## 2018-11-05 DIAGNOSIS — K838 Other specified diseases of biliary tract: Secondary | ICD-10-CM | POA: Insufficient documentation

## 2018-11-05 DIAGNOSIS — B1920 Unspecified viral hepatitis C without hepatic coma: Secondary | ICD-10-CM | POA: Insufficient documentation

## 2018-11-07 LAB — COMPREHENSIVE METABOLIC PANEL
AG RATIO: 1.6 (calc) (ref 1.0–2.5)
ALKALINE PHOSPHATASE (APISO): 39 U/L (ref 33–115)
ALT: 63 U/L — ABNORMAL HIGH (ref 6–29)
AST: 45 U/L — AB (ref 10–30)
Albumin: 4.4 g/dL (ref 3.6–5.1)
BUN: 11 mg/dL (ref 7–25)
CALCIUM: 9.7 mg/dL (ref 8.6–10.2)
CHLORIDE: 105 mmol/L (ref 98–110)
CO2: 26 mmol/L (ref 20–32)
Creat: 0.79 mg/dL (ref 0.50–1.10)
GLOBULIN: 2.7 g/dL (ref 1.9–3.7)
Glucose, Bld: 112 mg/dL — ABNORMAL HIGH (ref 65–99)
Potassium: 4.3 mmol/L (ref 3.5–5.3)
Sodium: 140 mmol/L (ref 135–146)
Total Bilirubin: 0.3 mg/dL (ref 0.2–1.2)
Total Protein: 7.1 g/dL (ref 6.1–8.1)

## 2018-11-07 LAB — HEPATITIS C GENOTYPE: HCV Genotype: 3

## 2018-11-18 ENCOUNTER — Other Ambulatory Visit: Payer: Self-pay | Admitting: Family

## 2018-11-18 DIAGNOSIS — B182 Chronic viral hepatitis C: Secondary | ICD-10-CM

## 2018-11-18 MED ORDER — SOFOSBUVIR-VELPATASVIR 400-100 MG PO TABS: 1.0000 | ORAL_TABLET | Freq: Every day | ORAL | 2 refills | Status: DC

## 2018-11-18 NOTE — Progress Notes (Signed)
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 Plan to treat with Epclusa for 12 weeks.

## 2018-11-19 ENCOUNTER — Other Ambulatory Visit: Payer: Self-pay | Admitting: Family

## 2018-11-19 DIAGNOSIS — B182 Chronic viral hepatitis C: Secondary | ICD-10-CM

## 2018-11-19 MED ORDER — SOFOSBUVIR-VELPATASVIR 400-100 MG PO TABS: 1.0000 | ORAL_TABLET | Freq: Every day | ORAL | 2 refills | Status: DC

## 2018-12-05 ENCOUNTER — Telehealth: Payer: Self-pay | Admitting: Pharmacist

## 2018-12-05 ENCOUNTER — Encounter: Payer: Self-pay | Admitting: Pharmacy Technician

## 2018-12-05 NOTE — Telephone Encounter (Signed)
Patient is approved to receive Epclusa x 12 weeks for chronic Hepatitis C infection. Counseled patient to take Epclusa daily with or without food. Encouraged patient not to miss any doses and explained how their chance of cure could go down with each dose missed. Counseled patient on what to do if dose is missed - if it is closer to the missed dose take immediately; if closer to next dose skip dose and take the next dose at the usual time.   Counseled patient on common symptoms including headache, fatigue, and nausea and that the symptoms normally decrease with time. I reviewed patient medications and found no drug interactions. Discussed with patient that there are several drug interactions including acid suppressants. Instructed patient to call clinic if she wishes to start a new medication during course of therapy. Also advised patient to call if she experiences any side effects. Follow up with me in the pharmacy clinic on 1/15 at 1130am.

## 2018-12-10 ENCOUNTER — Emergency Department (HOSPITAL_BASED_OUTPATIENT_CLINIC_OR_DEPARTMENT_OTHER)
Admission: EM | Admit: 2018-12-10 | Discharge: 2018-12-10 | Disposition: A | Discharge status: Home / Self Care | Payer: Self-pay | Attending: Emergency Medicine | Admitting: Emergency Medicine

## 2018-12-10 ENCOUNTER — Emergency Department (HOSPITAL_BASED_OUTPATIENT_CLINIC_OR_DEPARTMENT_OTHER): Payer: Self-pay

## 2018-12-10 ENCOUNTER — Other Ambulatory Visit: Payer: Self-pay

## 2018-12-10 ENCOUNTER — Encounter (HOSPITAL_BASED_OUTPATIENT_CLINIC_OR_DEPARTMENT_OTHER): Payer: Self-pay | Admitting: *Deleted

## 2018-12-10 DIAGNOSIS — F17210 Nicotine dependence, cigarettes, uncomplicated: Secondary | ICD-10-CM | POA: Insufficient documentation

## 2018-12-10 DIAGNOSIS — M25561 Pain in right knee: Secondary | ICD-10-CM | POA: Insufficient documentation

## 2018-12-10 DIAGNOSIS — Z79899 Other long term (current) drug therapy: Secondary | ICD-10-CM | POA: Insufficient documentation

## 2018-12-10 MED ORDER — DICLOFENAC SODIUM 75 MG PO TBEC: 75.0000 mg | DELAYED_RELEASE_TABLET | Freq: Two times a day (BID) | ORAL | 0 refills | Status: DC

## 2018-12-10 NOTE — ED Provider Notes (Signed)
[facility] EMERGENCY DEPARTMENT Provider Note   CSN: [id number] Arrival date & time: 12/10/18  1220     History   Chief Complaint Chief Complaint  Patient presents with  . Knee Injury    HPI Briana Zhang is a 37 y.o. female.  The history is provided by the patient. No language interpreter was used.  Knee Pain   This is a new problem. The current episode started yesterday. The problem occurs constantly. The problem has been gradually worsening. The pain is present in the right knee. Associated symptoms include limited range of motion. She has tried nothing for the symptoms. The treatment provided no relief. There has been no history of extremity trauma.    Past Medical History:  Diagnosis Date  . Bipolar 1 disorder (HCC)   . Cellulitis and abscess   . Paranoid schizophrenia (HCC)   . Substance abuse (HCC)    IVDU mulitple substances    Patient Active Problem List   Diagnosis Date Noted  . Chronic hepatitis C without hepatic coma (HCC) 10/23/2018  . Sepsis (HCC) 02/05/2017  . S/P C-section 06/18/2014  . Placental abruption in third trimester 06/14/2014  . Supervision of high risk pregnancy due to social problems in third trimester 05/27/2014  . 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 CESAREAN SECTION    . CESAREAN SECTION N/A 06/18/2014   Procedure: CESAREAN SECTION;  Surgeon: Adam PhenixJames G Arnold, MD;  Location: [facility];  Service: Obstetrics;  Laterality: N/A;  . WISDOM TOOTH EXTRACTION       OB History    Gravida  4   Para  3   Term  2   Preterm  1   AB  1   Living  3     SAB      TAB  1   Ectopic      Multiple      Live Births  3            Home Medications    Prior to Admission medications   Medication Sig Start Date End Date Taking? Authorizing Provider  FLUoxetine (PROZAC) 20 MG capsule Take 20 mg by mouth daily.   Yes [provider]  gabapentin (NEURONTIN)  300 MG capsule Take 600 mg by mouth 2 (two) times daily.   Yes [provider]  Sofosbuvir-Velpatasvir (EPCLUSA) 400-100 MG TABS Take 1 tablet by mouth daily. 11/19/18  Yes Veryl Speakalone, Gregory D, FNP  diclofenac (VOLTAREN) 75 MG EC tablet Take 1 tablet (75 mg total) by mouth 2 (two) times daily. 12/10/18   Elson AreasSofia, Kilian Schwartz K, PA-C    Family History Family History  Problem Relation Age of Onset  . Cancer Mother   . Depression Mother   . Diabetes Father   . Cancer Maternal Grandmother   . Cancer Maternal Grandfather     Social History Social History   Tobacco Use  . Smoking status: Current Every Day Smoker    Packs/day: 0.50    Types: E-cigarettes  . Smokeless tobacco: Never Used  Substance Use Topics  . Alcohol use: No  . Drug use: No    Types: Heroin    Comment: sober x 11 monrhs     Allergies   Ciprofloxacin   Review of Systems Review of Systems  All other systems reviewed and are negative.    Physical Exam Updated Vital Signs BP 105/69 (BP Location: Left Arm)   Pulse 73  Temp 98.2 F (36.8 C) (Oral)   Resp 18   Ht 5\' 1"  (1.549 m)   Wt 59 kg   LMP 11/26/2018   SpO2 98%   BMI 24.56 kg/m   Physical Exam Vitals signs and nursing note reviewed.  Constitutional:      Appearance: Normal appearance.  HENT:     Head: Normocephalic.  Cardiovascular:     Rate and Rhythm: Normal rate.  Pulmonary:     Effort: Pulmonary effort is normal.  Musculoskeletal:        General: Tenderness present. No swelling.     Comments: Tender right knee,  Pain with moving,  No effusion,  nv and ns intact,  No med or lat instability  Negative drawer  Skin:    General: Skin is warm.  Neurological:     General: No focal deficit present.     Mental Status: She is alert.      ED Treatments / Results  Labs (all labs ordered are listed, but only abnormal results are displayed) Labs Reviewed - No data to display  EKG None  Radiology Dg Knee Complete 4 Views  Right  Result Date: 12/10/2018 CLINICAL DATA:  Right medial knee pain. EXAM: RIGHT KNEE - COMPLETE 4+ VIEW COMPARISON:  None. FINDINGS: No evidence of fracture, dislocation, or joint effusion. No evidence of arthropathy or other focal bone abnormality. Soft tissues are unremarkable. IMPRESSION: Negative. Electronically Signed   By: Gerome Sam III M.D   On: 12/10/2018 12:53    Procedures Procedures (including critical care time)  Medications Ordered in ED Medications - No data to display   Initial Impression / Assessment and Plan / ED Course  An After Visit Summary was printed and given to the patient. I have reviewed the triage vital signs and the nursing notes.  Pertinent labs & imaging results that were available during my care of the patient were reviewed by me and considered in my medical decision making (see chart for details).     MDM  Pt has a knee immbolizer and crutches at home.  Pt given rx for voltaren.  Pt advised to follow up with Dr. Pearletha Forge if pain persist   Final Clinical Impressions(s) / ED Diagnoses   Final diagnoses:  Acute pain of right knee    ED Discharge Orders         Ordered    diclofenac (VOLTAREN) 75 MG EC tablet  2 times daily     12/10/18 [address], [place] 12/10/18 1500    Geoffery Lyons, MD 12/10/18 1504

## 2018-12-10 NOTE — ED Triage Notes (Signed)
Right knee pain after putting weight on her knees.

## 2018-12-10 NOTE — Discharge Instructions (Addendum)
Wear your knee imbolizer.  Use crutches.  Follow up with Dr. Pearletha ForgeHudnall for recheck

## 2018-12-24 ENCOUNTER — Encounter: Payer: Self-pay | Admitting: Family Medicine

## 2018-12-24 ENCOUNTER — Ambulatory Visit (INDEPENDENT_AMBULATORY_CARE_PROVIDER_SITE_OTHER): Payer: Self-pay | Admitting: Family Medicine

## 2018-12-24 VITALS — BP 119/90 | HR 79 | Ht 61.0 in | Wt 130.0 lb

## 2018-12-24 DIAGNOSIS — M25561 Pain in right knee: Secondary | ICD-10-CM

## 2018-12-24 MED ORDER — METHYLPREDNISOLONE ACETATE 40 MG/ML IJ SUSP
40.0000 mg | Freq: Once | INTRAMUSCULAR | Status: AC
  Administered 2018-12-24: 40 mg via INTRA_ARTICULAR

## 2018-12-24 NOTE — Progress Notes (Signed)
PCP: [facility], [facility]  Subjective:   HPI: Patient is a 37 y.o. female here for right knee pain.  Patient reports about 6 years ago she recalls playing volleyball and injured her right knee. Was told in an ED that she had torn meniscus in her knee. She seemed to improve some but has had intermittent problems with this knee. Will get popping, acute pain and difficulty walking for about 1-2 days. She recalls getting a steroid injection once which helped some. She works as a Child psychotherapistwaitress. Pain currently 0/10. Pain is typically medial, can be sharp. No skin changes, numbness.  Past Medical History:  Diagnosis Date  . Bipolar 1 disorder (HCC)   . Cellulitis and abscess   . Paranoid schizophrenia (HCC)   . Substance abuse (HCC)    IVDU mulitple substances    Current Outpatient Medications on File Prior to Visit  Medication Sig Dispense Refill  . diclofenac (VOLTAREN) 75 MG EC tablet Take 1 tablet (75 mg total) by mouth 2 (two) times daily. 20 tablet 0  . FLUoxetine (PROZAC) 20 MG capsule Take 20 mg by mouth daily.    Marland Kitchen. gabapentin (NEURONTIN) 300 MG capsule Take 600 mg by mouth 2 (two) times daily.    . Sofosbuvir-Velpatasvir (EPCLUSA) 400-100 MG TABS Take 1 tablet by mouth daily. 28 tablet 2   No current facility-administered medications on file prior to visit.     Past Surgical History:  Procedure Laterality Date  . CESAREAN SECTION    . CESAREAN SECTION N/A 06/18/2014   Procedure: CESAREAN SECTION;  Surgeon: Adam PhenixJames G Arnold, MD;  Location: [facility];  Service: Obstetrics;  Laterality: N/A;  . WISDOM TOOTH EXTRACTION      Allergies  Allergen Reactions  . Ciprofloxacin Swelling and Rash    Social History   Socioeconomic History  . Marital status: Single    Spouse name: Not on file  . Number of children: Not on file  . Years of education: Not on file  . Highest education level: Not on file  Occupational History  . Occupation: Financial plannerervice Manager   Social Needs   . Financial resource strain: Not on file  . Food insecurity:    Worry: Not on file    Inability: Not on file  . Transportation needs:    Medical: Not on file    Non-medical: Not on file  Tobacco Use  . Smoking status: Current Every Day Smoker    Packs/day: 0.50    Types: E-cigarettes  . Smokeless tobacco: Never Used  Substance and Sexual Activity  . Alcohol use: No  . Drug use: No    Types: Heroin    Comment: sober x 11 monrhs  . Sexual activity: Yes    Birth control/protection: None  Lifestyle  . Physical activity:    Days per week: Not on file    Minutes per session: Not on file  . Stress: Not on file  Relationships  . Social connections:    Talks on phone: Not on file    Gets together: Not on file    Attends religious service: Not on file    Active member of club or organization: Not on file    Attends meetings of clubs or organizations: Not on file    Relationship status: Not on file  . Intimate partner violence:    Fear of current or ex partner: Not on file    Emotionally abused: Not on file    Physically abused: Not on  file    Forced sexual activity: Not on file  Other Topics Concern  . Not on file  Social History Narrative  . Not on file    Family History  Problem Relation Age of Onset  . Cancer Mother   . Depression Mother   . Diabetes Father   . Cancer Maternal Grandmother   . Cancer Maternal Grandfather     BP 119/90   Pulse 79   Ht 5\' 1"  (1.549 m)   Wt 130 lb (59 kg)   LMP 11/26/2018   BMI 24.56 kg/m   Review of Systems: See HPI above.     Objective:  Physical Exam:  Gen: NAD, comfortable in exam room  Right knee: No gross deformity, ecchymoses, swelling. Mild TTP medial joint line.  No other tenderness. FROM with 5/5 strength flexion and extension. Negative ant/post drawers. Negative valgus/varus testing. Negative lachmans. Negative mcmurrays, apleys, bounce, patellar apprehension. Feels like going to buckle with thessalys. NV  intact distally.  Left knee: No deformity. FROM with 5/5 strength. No tenderness to palpation. NVI distally.   Assessment & Plan:  1. Right knee pain - independently reviewed radiographs and no bony abnormalities.  Some instability with thessalys and medial joint line tenderness.  Suggestive of small medial meniscus tear.  Start physical therapy.  Knee brace.  Ibuprofen or aleve.  Intraarticular steroid injection given.  Fu in 6 weeks.  Consider MRI if not improving.  After informed written consent timeout was performed, patient was seated on exam table. Right knee was prepped with alcohol swab and utilizing anteromedial approach, patient's right knee was injected intraarticularly with 3:1 bupivicaine: depomedrol. Patient tolerated the procedure well without immediate complications.

## 2018-12-24 NOTE — Patient Instructions (Signed)
This is concerning for a small medial meniscus tear. You were given a cortisone injection today. Ibuprofen 600mg  three times a day OR aleve 2 tabs twice a day with food for pain and inflammation. Knee brace when up and walking around for support. Start physical therapy and do home exercises on days you don't go to therapy. If you don't respond to therapy would consider MRI of this knee.

## 2019-01-08 ENCOUNTER — Ambulatory Visit (INDEPENDENT_AMBULATORY_CARE_PROVIDER_SITE_OTHER): Payer: Self-pay | Admitting: Pharmacist

## 2019-01-08 DIAGNOSIS — B182 Chronic viral hepatitis C: Secondary | ICD-10-CM

## 2019-01-08 NOTE — Progress Notes (Signed)
HPI: Briana Zhang is a 38 y.o. female who presents to the [facility] pharmacy clinic for one month follow-up on Epclusa for Hepatitis C.  Medication: Dorita Fraypclusa  Start Date: 11/29/2018  Hepatitis C Genotype: 3  Fibrosis Score: F2/F3  Hepatitis C RNA: 37,800 on 10/23/2018  Patient Active Problem List   Diagnosis Date Noted  . Chronic hepatitis C without hepatic coma (HCC) 10/23/2018  . Sepsis (HCC) 02/05/2017  . S/P C-section 06/18/2014  . Placental abruption in third trimester 06/14/2014  . Supervision of high risk pregnancy due to social problems in third trimester 05/27/2014  . 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   FLUOXETINE (PROZAC) 20 MG CAPSULE    Take 20 mg by mouth daily.   GABAPENTIN (NEURONTIN) 300 MG CAPSULE    Take 600 mg by mouth 2 (two) times daily.   SOFOSBUVIR-VELPATASVIR (EPCLUSA) 400-100 MG TABS    Take 1 tablet by mouth daily.  Modified Medications   No medications on file  Discontinued Medications   No medications on file    Allergies: Allergies  Allergen Reactions  . Ciprofloxacin Swelling and Rash    Past Medical History: Past Medical History:  Diagnosis Date  . Bipolar 1 disorder (HCC)   . Cellulitis and abscess   . Paranoid schizophrenia (HCC)   . Substance abuse (HCC)    IVDU mulitple substances    Social History: Social History   Socioeconomic History  . Marital status: Single    Spouse name: Not on file  . Number of children: Not on file  . Years of education: Not on file  . Highest education level: Not on file  Occupational History  . Occupation: Financial plannerervice Manager   Social Needs  . Financial resource strain: Not on file  . Food insecurity:    Worry: Not on file    Inability: Not on file  . Transportation needs:    Medical: Not on file      Non-medical: Not on file  Tobacco Use  . Smoking status: Current Every Day Smoker    Packs/day: 0.50    Types: E-cigarettes  . Smokeless tobacco: Never Used  Substance and Sexual Activity  . Alcohol use: No  . Drug use: No    Types: Heroin    Comment: sober x 11 monrhs  . Sexual activity: Yes    Birth control/protection: None  Lifestyle  . Physical activity:    Days per week: Not on file    Minutes per session: Not on file  . Stress: Not on file  Relationships  . Social connections:    Talks on phone: Not on file    Gets together: Not on file    Attends religious service: Not on file    Active member of club or organization: Not on file    Attends meetings of clubs or organizations: Not on file    Relationship status: Not on file  Other Topics Concern  . Not on file  Social History Narrative  . Not on file    Labs: Hepatitis C Lab Results  Component Value Date   HCVGENOTYPE 3 11/05/2018   HCVRNAPCRQN 37,800 (H) 10/23/2018   Hepatitis B Lab Results  Component Value Date   HEPBSAG NEGATIVE 05/27/2014   Hepatitis A No  results found for: HAV HIV Lab Results  Component Value Date   HIV NONREACTIVE 05/27/2014   HIV NONREACTIVE 05/07/2014   HIV NON REACTIVE 09/28/2008   Lab Results  Component Value Date   CREATININE 0.79 11/05/2018   CREATININE 0.64 02/14/2017   CREATININE 0.73 02/06/2017   CREATININE 0.84 02/05/2017   CREATININE 0.70 07/04/2014   Lab Results  Component Value Date   AST 45 (H) 11/05/2018   AST 22 02/14/2017   AST 17 05/07/2014   ALT 63 (H) 11/05/2018   ALT 13 (L) 02/14/2017   ALT 8 05/07/2014    Assessment: Briana Zhang is here today for her first follow-up appointment after starting Epclusa x12 weeks on 11/29/2018 for Hepatitis C. She is tolerating the medication well and only has complaints of some headaches which she takes ibuprofen for. She reports that she missed a dose after falling asleep early one night but has otherwise been  adherent to the regimen. She receives her medication through the mail from [facility] and has had no issues. She has already received her second month of medication and is expecting one more refill. Briana Zhang expresses no other concerns regarding her medication at this time.  Will order HCV RNA and CMET today to ensure her labs are trending down. Also had Briana Zhang fill out a form for [facility] today as she is uninsured to help cover her lab work. Will follow-up with her for her EOT appointment on 3/17.    Plan: -HCV RNA -CMET -F/u with Cassie on 3/17 at 11:30 for EOT appointment   Janele Lague [facility] [employer] 01/08/2019, 12:05 PM

## 2019-01-10 LAB — COMPREHENSIVE METABOLIC PANEL
AG Ratio: 1.5 (calc) (ref 1.0–2.5)
ALBUMIN MSPROF: 4.1 g/dL (ref 3.6–5.1)
ALT: 11 U/L (ref 6–29)
AST: 17 U/L (ref 10–30)
Alkaline phosphatase (APISO): 36 U/L (ref 33–115)
BILIRUBIN TOTAL: 0.3 mg/dL (ref 0.2–1.2)
BUN: 11 mg/dL (ref 7–25)
CALCIUM: 9.3 mg/dL (ref 8.6–10.2)
CHLORIDE: 107 mmol/L (ref 98–110)
CO2: 26 mmol/L (ref 20–32)
Creat: 0.71 mg/dL (ref 0.50–1.10)
GLOBULIN: 2.7 g/dL (ref 1.9–3.7)
GLUCOSE: 104 mg/dL — AB (ref 65–99)
POTASSIUM: 4.7 mmol/L (ref 3.5–5.3)
SODIUM: 139 mmol/L (ref 135–146)
Total Protein: 6.8 g/dL (ref 6.1–8.1)

## 2019-01-10 LAB — HEPATITIS C RNA QUANTITATIVE
HCV QUANT LOG: NOT DETECTED {Log_IU}/mL
HCV RNA, PCR, QN: <15 IU/mL

## 2019-01-20 ENCOUNTER — Other Ambulatory Visit: Payer: Self-pay

## 2019-01-20 ENCOUNTER — Ambulatory Visit: Payer: Self-pay | Attending: Family Medicine | Admitting: Physical Therapy

## 2019-01-20 ENCOUNTER — Encounter: Payer: Self-pay | Admitting: Physical Therapy

## 2019-01-20 DIAGNOSIS — R29898 Other symptoms and signs involving the musculoskeletal system: Secondary | ICD-10-CM | POA: Insufficient documentation

## 2019-01-20 DIAGNOSIS — M25561 Pain in right knee: Secondary | ICD-10-CM | POA: Insufficient documentation

## 2019-01-20 DIAGNOSIS — G8929 Other chronic pain: Secondary | ICD-10-CM | POA: Insufficient documentation

## 2019-01-20 NOTE — Therapy (Addendum)
[facility] [address]  [place], [place], [address] Phone: [phone]   Fax:  [phone]  Physical Therapy Evaluation  Patient Details  Name: CHAPEL SILVERTHORN MRN: [id number] Date of Birth: 27-Mar-1981 Referring Provider (PT): Karlton Lemon, MD   Encounter Date: 01/20/2019  PT End of Session - 01/20/19 1350    Visit Number  1    Number of Visits  4    Date for PT Re-Evaluation  02/10/19    Authorization Type  [facility] [employer]    PT Start Time  1310    PT Stop Time  3614    PT Time Calculation (min)  39 min    Activity Tolerance  Patient tolerated treatment well    Behavior During Therapy  [facility] for tasks assessed/performed       Past Medical History:  Diagnosis Date  . Bipolar 1 disorder ([place])   . Cellulitis and abscess   . Paranoid schizophrenia ([place])   . Substance abuse ([place])    IVDU mulitple substances    Past Surgical History:  Procedure Laterality Date  . CESAREAN SECTION    . CESAREAN SECTION N/A 06/18/2014   Procedure: CESAREAN SECTION;  Surgeon: Woodroe Mode, MD;  Location: [facility];  Service: Obstetrics;  Laterality: N/A;  . WISDOM TOOTH EXTRACTION      There were no vitals filed for this visit.   Subjective Assessment - 01/20/19 1313    Subjective  Patient reports that about 5 years ago she hurt her R knee- was told that she tore her meniscus and needed surgery. Got better after this event but notes that she has continued to re-injure it about 1x/year since then. When she injures it, it "feels like the bone is sliding" and the knee buckles underneath her. Has trouble walking, uses a knee immobilizer, has trouble bending it, and going up stairs. Last time that she hurt it was in late December- she was at work and pivoted, causing it to give out on her. MD gave her a steroid injection, which helped a lot- notes 100% improvement and no issues walking at this time. Still does note popping in B knees  with bending the knee back.      Pertinent History  chronic hepatitis C without hepatic coma, substance abuse, paranoid schizophrenia, bipolar 1 disorder    Limitations  Lifting;Standing;House hold activities;Walking    How long can you sit comfortably?  unlimited    How long can you stand comfortably?  unlimited    How long can you walk comfortably?  unlimited    Diagnostic tests  12/10/18 R knee xray: negative     Patient Stated Goals  learn about things to do by myself    Currently in Pain?  No/denies    Pain Score  0-No pain    Pain Location  Knee    Pain Orientation  Right;Medial    Pain Descriptors / Indicators  Aching;Sharp    Pain Type  Chronic pain         [facility] PT Assessment - 01/20/19 1323      Assessment   Medical Diagnosis  R medial knee pian    Referring Provider (PT)  Karlton Lemon, MD    Onset Date/Surgical Date  12/24/18    Next MD Visit  Not scheduled    Prior Therapy  No      Precautions   Precautions  --   Hepatitis C  Restrictions   Weight Bearing Restrictions  No      Balance Screen   Has the patient fallen in the past 6 months  Yes    How many times?  1   fell playing disc golf and hurt knee   Has the patient had a decrease in activity level because of a fear of falling?   No    Is the patient reluctant to leave their home because of a fear of falling?   No      Home Environment   Living Environment  Private residence    Type of Home  Group Home    Home Access  Stairs to enter    Entrance Stairs-Number of Steps  6    Entrance Stairs-Rails  Right;Left    Home Layout  Multi-level    Alternate Level Stairs-Number of Steps  6    Alternate Level Stairs-Rails  Right;Left      Prior Function   Level of Independence  Independent      Cognition   Overall Cognitive Status  Within Functional Limits for tasks assessed      Observation/Other Assessments   Observations  increased lateral tracking with R knee extension      Sensation   Light  Touch  Appears Intact      Coordination   Gross Motor Movements are Fluid and Coordinated  Yes      Posture/Postural Control   Posture/Postural Control  Postural limitations    Postural Limitations  Rounded Shoulders      ROM / Strength   AROM / PROM / Strength  AROM;Strength;PROM      AROM   AROM Assessment Site  Knee    Right/Left Knee  Right;Left    Right Knee Extension  -2    Right Knee Flexion  133    Left Knee Extension  -1    Left Knee Flexion  135      PROM   PROM Assessment Site  Knee    Right/Left Knee  Right;Left    Right Knee Extension  -4    Right Knee Flexion  136    Left Knee Extension  -2    Left Knee Flexion  140      Strength   Strength Assessment Site  Hip;Knee;Ankle    Right/Left Hip  Right;Left    Right Hip Flexion  4+/5    Right Hip ABduction  4+/5    Right Hip ADduction  4+/5    Left Hip Flexion  4/5    Left Hip ABduction  4+/5    Left Hip ADduction  4+/5    Right/Left Knee  Right;Left    Right Knee Flexion  4+/5    Right Knee Extension  4+/5    Left Knee Flexion  4/5    Left Knee Extension  4+/5    Right/Left Ankle  Right;Left    Right Ankle Dorsiflexion  4+/5    Right Ankle Plantar Flexion  4+/5    Left Ankle Dorsiflexion  4+/5    Left Ankle Plantar Flexion  4+/5      Flexibility   Soft Tissue Assessment /Muscle Length  yes    Hamstrings  WFL    Quadriceps  WFL    ITB  moderately tight on R      Palpation   Patella mobility  B patella nonpainful normal mobility      Ambulation/Gait   Ambulation/Gait  Yes    Assistive device  None  Gait Pattern  Step-through pattern;Lateral trunk lean to left;Decreased stance time - left;Decreased weight shift to right    Ambulation Surface  Level;Indoor    Gait velocity  WFL                Objective measurements completed on examination: See above findings.              PT Education - 01/20/19 1350    Education Details  prognosis, POC, HEP    Person(s) Educated   Patient    Methods  Explanation;Demonstration;Tactile cues;Verbal cues;Handout    Comprehension  Verbalized understanding;Returned demonstration       PT Short Term Goals - 01/20/19 1539      PT SHORT TERM GOAL #1   Title  Patient to be independent with HEP.    Time  1    Period  Weeks    Status  New    Target Date  01/27/19        PT Long Term Goals - 01/20/19 1539      PT LONG TERM GOAL #1   Title  Patient to be independent with advanced HEP.    Time  3    Period  Weeks    Status  New    Target Date  02/10/19      PT LONG TERM GOAL #2   Title  Patient to demonstrate B LE strength >=4+/5.    Time  3    Period  Weeks    Status  New    Target Date  02/10/19      PT LONG TERM GOAL #3   Title  Patient to demonstrate 50% improvement in palpable R knee patellar tracking with knee extension.     Time  3    Period  Weeks    Status  New    Target Date  02/10/19      PT LONG TERM GOAL #4   Title  Patient to demonstrate WNL R TFL flexibility.     Time  3    Period  Weeks    Status  New    Target Date  02/10/19             Plan - 01/20/19 1533    Clinical Impression Statement  Patient is a 37y/o F presenting to [facility] with c/o hx of chronic R medial knee pain. Recently given a steroid injection to R knee and notes 100% improvement. However, patient reports chronic re-injury of this knee and requesting HEP on re-injury prevention. At time of re-injury, reports medial knee pain and difficulty with walking, knee flexion, stairs, and uses a knee immobilizer. Patient today with mild L LE weakness, good R knee ROM, increaser lateral patellar tracking of R knee with extension, tightness in R TFL, and gait deviations. Patient educated on gentle stretching and strengthening HEP and given handout. Reported understanding. Would benefit from skilled PT services 1x/week for 3 weeks to address aforementioned impairments.     Clinical Presentation  Stable    Clinical Decision Making   Low    Rehab Potential  Good    Clinical Impairments Affecting Rehab Potential  chronic hepatitis C without hepatic coma, substance abuse, paranoid schizophrenia, bipolar 1 disorder    PT Frequency  1x / week    PT Duration  3 weeks    PT Treatment/Interventions  ADLs/Self Care Home Management;Cryotherapy;Electrical Stimulation;Iontophoresis 21m/ml Dexamethasone;Functional mobility training;Stair training;Gait training;Ultrasound;Moist Heat;Therapeutic activities;Therapeutic exercise;Balance training;Neuromuscular re-education;Patient/family education;Passive range of motion;Manual techniques;Dry  needling;Energy conservation;Splinting;Taping;Vasopneumatic Device    PT Next Visit Plan  reassess HEP    Consulted and Agree with Plan of Care  Patient       Patient will benefit from skilled therapeutic intervention in order to improve the following deficits and impairments:  Decreased activity tolerance, Decreased strength, Pain, Decreased balance, Difficulty walking, Improper body mechanics, Postural dysfunction, Impaired flexibility  Visit Diagnosis: Chronic pain of right knee  Other symptoms and signs involving the musculoskeletal system     Problem List Patient Active Problem List   Diagnosis Date Noted  . Chronic hepatitis C without hepatic coma ([place]) 10/23/2018  . Sepsis ([place]) 02/05/2017  . S/P C-section 06/18/2014  . Placental abruption in third trimester 06/14/2014  . Supervision of high risk pregnancy due to social problems in third trimester 05/27/2014  . 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  [place], [place], [address] Phone: [phone]   Fax:  [phone]  Name: KESHANA KLEMZ MRN: [id number] Date of Birth: 03-22-1981   PHYSICAL THERAPY DISCHARGE SUMMARY  Visits from Start of Care: 1  Current functional level related  to goals / functional outcomes: See above clinical impression; patient did not return after initial eval   Remaining deficits: See above   Education / Equipment: HEP  Plan: Patient agrees to discharge.  Patient goals were not met. Patient is being discharged due to not returning since the last visit.  ?????     Janene Harvey, PT, DPT 02/20/19 2:42 PM

## 2019-03-11 ENCOUNTER — Ambulatory Visit (INDEPENDENT_AMBULATORY_CARE_PROVIDER_SITE_OTHER): Payer: Self-pay | Admitting: Pharmacist

## 2019-03-11 ENCOUNTER — Other Ambulatory Visit: Payer: Self-pay

## 2019-03-11 DIAGNOSIS — B182 Chronic viral hepatitis C: Secondary | ICD-10-CM

## 2019-03-11 NOTE — Progress Notes (Addendum)
HPI: Briana Zhang is a 38 y.o. female who presents to the [facility] pharmacy clinic for Hepatitis C follow-up at end-of-treatment.  Medication: Epclusa x 12 weeks  Start Date: 11/29/2018  Hepatitis C Genotype: 3  Fibrosis Score: F2/F3  Hepatitis C RNA: 37,800 (10/23/2018); <15, ND (01/08/2019)  Patient Active Problem List   Diagnosis Date Noted  . Chronic hepatitis C without hepatic coma ([facility]) 10/23/2018  . Sepsis ([facility]) 02/05/2017  . S/P C-section 06/18/2014  . Placental abruption in third trimester 06/14/2014  . Supervision of high risk pregnancy due to social problems in third trimester 05/27/2014  . 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   FLUOXETINE (PROZAC) 20 MG CAPSULE    Take 20 mg by mouth daily.   GABAPENTIN (NEURONTIN) 300 MG CAPSULE    Take 600 mg by mouth 2 (two) times daily.   SOFOSBUVIR-VELPATASVIR (EPCLUSA) 400-100 MG TABS    Take 1 tablet by mouth daily.  Modified Medications   No medications on file  Discontinued Medications   No medications on file    Allergies: Allergies  Allergen Reactions  . Ciprofloxacin Swelling and Rash    Past Medical History: Past Medical History:  Diagnosis Date  . Bipolar 1 disorder ([facility])   . Cellulitis and abscess   . Paranoid schizophrenia ([facility])   . Substance abuse ([facility])    IVDU mulitple substances    Social History: Social History   Socioeconomic History  . Marital status: Single    Spouse name: Not on file  . Number of children: Not on file  . Years of education: Not on file  . Highest education level: Not on file  Occupational History  . Occupation: Financial planner   Social Needs  . Financial resource strain: Not on file  . Food insecurity:    Worry: Not on file    Inability: Not on file  . Transportation needs:     Medical: Not on file    Non-medical: Not on file  Tobacco Use  . Smoking status: Current Every Day Smoker    Packs/day: 0.50    Types: E-cigarettes  . Smokeless tobacco: Never Used  Substance and Sexual Activity  . Alcohol use: No  . Drug use: No    Types: Heroin    Comment: sober x 11 monrhs  . Sexual activity: Yes    Birth control/protection: None  Lifestyle  . Physical activity:    Days per week: Not on file    Minutes per session: Not on file  . Stress: Not on file  Relationships  . Social connections:    Talks on phone: Not on file    Gets together: Not on file    Attends religious service: Not on file    Active member of club or organization: Not on file    Attends meetings of clubs or organizations: Not on file    Relationship status: Not on file  Other Topics Concern  . Not on file  Social History Narrative  . Not on file    Labs: Hepatitis C Lab Results  Component Value Date   HCVGENOTYPE 3 11/05/2018   HCVRNAPCRQN <15 NOT DETECTED 01/08/2019   HCVRNAPCRQN 37,800 (H) 10/23/2018   Hepatitis B Lab Results  Component Value Date  HEPBSAG NEGATIVE 05/27/2014   Hepatitis A No results found for: HAV HIV Lab Results  Component Value Date   HIV NONREACTIVE 05/27/2014   HIV NONREACTIVE 05/07/2014   HIV NON REACTIVE 09/28/2008   Lab Results  Component Value Date   CREATININE 0.71 01/08/2019   CREATININE 0.79 11/05/2018   CREATININE 0.64 02/14/2017   CREATININE 0.73 02/06/2017   CREATININE 0.84 02/05/2017   Lab Results  Component Value Date   AST 17 01/08/2019   AST 45 (H) 11/05/2018   AST 22 02/14/2017   ALT 11 01/08/2019   ALT 63 (H) 11/05/2018   ALT 13 (L) 02/14/2017    Assessment:  Briana Zhang is a 38 y.o. female, presents to the [facility] for HCV end-of-treatment follow-up (regimen Epclusa x 12 weeks). Patient presented wearing a mask and gloves which she had personally procured, although she was negative for COVID19 screening questions  asked at the clinic - she is just being cautious. Patient denies fever or cough today, and said that she is feeling well. Patient reports that she finished her Epclusa x 12 weeks "a few weeks ago".  She reports she may have missed ~3 doses in the entire 12 weeks. She reported she usually took her Epclusa at bedtime, as it made her tired, and sometimes caused a headache.  Patient responded well to Epclusa, with HCV RNA not detected (<15, 01/08/2019) and pre-treatment HCV RNA of 37,800 (10/23/2018). Patient renal function and liver function within normal limits (CMP from 01/08/2019). CMP deferred today. Pharmacist discussed that we will check HCV RNA today, and we will check again in 12 weeks to confirm cure.  Patient had questions about bills from [place], and if her patient assistance was approved. We encouraged her to call number to confirm if she was approved, and that we would fill out the forms for her again if needed.  We encouraged her to contact us if she had any further concerns about this.  Plan: - Labs: HCV RNA today - Follow-up at clinic with clinical pharmacist in 12 weeks for cure visit on 06/12/2019  Lenora Boys, PharmD Candidate, [employer] [facility] 03/11/2019, 11:36 AM

## 2019-03-13 LAB — HEPATITIS C RNA QUANTITATIVE
HCV Quantitative Log: <1.18 Log IU/mL
HCV RNA, PCR, QN: <15 IU/mL

## 2019-06-12 ENCOUNTER — Other Ambulatory Visit: Payer: Self-pay

## 2019-06-12 ENCOUNTER — Ambulatory Visit (INDEPENDENT_AMBULATORY_CARE_PROVIDER_SITE_OTHER): Payer: Self-pay | Admitting: Pharmacist

## 2019-06-12 DIAGNOSIS — B182 Chronic viral hepatitis C: Secondary | ICD-10-CM

## 2019-06-12 NOTE — Progress Notes (Signed)
HPI: Briana Zhang is a 38 y.o. female who presents to the [facility] clinic for Hepatitis C follow-up.  Medication: Epclusa x 12 weeks  Start Date: 11/29/2018  Hepatitis C Genotype: 3  Fibrosis Score: F2/F3  Hepatitis C RNA: 37,800 on 10/23/18; <15 on 01/08/2019 and 03/11/2019  Patient Active Problem List   Diagnosis Date Noted  . Chronic hepatitis C without hepatic coma ([place]) 10/23/2018  . Sepsis ([place]) 02/05/2017  . S/P C-section 06/18/2014  . Placental abruption in third trimester 06/14/2014  . Supervision of high risk pregnancy due to social problems in third trimester 05/27/2014  . 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   FLUOXETINE (PROZAC) 20 MG CAPSULE    Take 20 mg by mouth daily.   GABAPENTIN (NEURONTIN) 300 MG CAPSULE    Take 600 mg by mouth 2 (two) times daily.  Modified Medications   No medications on file  Discontinued Medications   No medications on file    Allergies: Allergies  Allergen Reactions  . Ciprofloxacin Swelling and Rash    Past Medical History: Past Medical History:  Diagnosis Date  . Bipolar 1 disorder ([place])   . Cellulitis and abscess   . Paranoid schizophrenia ([place])   . Substance abuse ([place])    IVDU mulitple substances    Social History: Social History   Socioeconomic History  . Marital status: Single    Spouse name: Not on file  . Number of children: Not on file  . Years of education: Not on file  . Highest education level: Not on file  Occupational History  . Occupation: Therapist, nutritional   Social Needs  . Financial resource strain: Not on file  . Food insecurity    Worry: Not on file    Inability: Not on file  . Transportation needs    Medical: Not on file    Non-medical: Not on file  Tobacco Use  . Smoking status:  Current Every Day Smoker    Packs/day: 0.50    Types: E-cigarettes  . Smokeless tobacco: Never Used  Substance and Sexual Activity  . Alcohol use: No  . Drug use: No    Types: Heroin    Comment: sober x 11 monrhs  . Sexual activity: Yes    Birth control/protection: None  Lifestyle  . Physical activity    Days per week: Not on file    Minutes per session: Not on file  . Stress: Not on file  Relationships  . Social Herbalist on phone: Not on file    Gets together: Not on file    Attends religious service: Not on file    Active member of club or organization: Not on file    Attends meetings of clubs or organizations: Not on file    Relationship status: Not on file  Other Topics Concern  . Not on file  Social History Narrative  . Not on file    Labs: Hepatitis C Lab Results  Component Value Date   HCVGENOTYPE 3 11/05/2018   HCVRNAPCRQN <15 NOT DETECTED 03/11/2019   HCVRNAPCRQN <15 NOT DETECTED 01/08/2019   HCVRNAPCRQN 37,800 (H) 10/23/2018   Hepatitis B Lab Results  Component Value Date   HEPBSAG NEGATIVE 05/27/2014   Hepatitis A No  results found for: HAV HIV Lab Results  Component Value Date   HIV NONREACTIVE 05/27/2014   HIV NONREACTIVE 05/07/2014   HIV NON REACTIVE 09/28/2008   Lab Results  Component Value Date   CREATININE 0.71 01/08/2019   CREATININE 0.79 11/05/2018   CREATININE 0.64 02/14/2017   CREATININE 0.73 02/06/2017   CREATININE 0.84 02/05/2017   Lab Results  Component Value Date   AST 17 01/08/2019   AST 45 (H) 11/05/2018   AST 22 02/14/2017   ALT 11 01/08/2019   ALT 63 (H) 11/05/2018   ALT 13 (L) 02/14/2017    Assessment: Briana Zhang is here today for her SVR12 Hepatitis C cure visit.  She finished 12 weeks of Epclusa 3 months ago with no issues.  Her early on treatment and end of treatment Hep C RNAs were both undetectable.  Counseled her that her Hepatitis C antibody will always be positive and encouraged her to abstain from  any drug use as to not get reinfected.  Will get cure labs today.   Plan: - Hep C RNA for cure  Eryka Dolinger L. Valari Taylor, PharmD, BCIDP, AAHIVP, CPP Infectious Diseases Clinical Pharmacist [facility] 06/12/2019, 11:39 AM

## 2019-06-18 LAB — HEPATITIS C RNA QUANTITATIVE
HCV Quantitative Log: <1.18 Log IU/mL
HCV RNA, PCR, QN: <15 IU/mL

## 2019-08-08 ENCOUNTER — Other Ambulatory Visit: Payer: Self-pay

## 2019-08-08 ENCOUNTER — Emergency Department (HOSPITAL_COMMUNITY)
Admission: EM | Admit: 2019-08-08 | Discharge: 2019-08-08 | Disposition: A | Discharge status: Home / Self Care | Payer: Self-pay | Attending: Emergency Medicine | Admitting: Emergency Medicine

## 2019-08-08 ENCOUNTER — Encounter (HOSPITAL_COMMUNITY): Payer: Self-pay | Admitting: Emergency Medicine

## 2019-08-08 DIAGNOSIS — Z881 Allergy status to other antibiotic agents status: Secondary | ICD-10-CM | POA: Insufficient documentation

## 2019-08-08 DIAGNOSIS — Z79899 Other long term (current) drug therapy: Secondary | ICD-10-CM | POA: Insufficient documentation

## 2019-08-08 DIAGNOSIS — F17290 Nicotine dependence, other tobacco product, uncomplicated: Secondary | ICD-10-CM | POA: Insufficient documentation

## 2019-08-08 DIAGNOSIS — Z20822 Contact with and (suspected) exposure to covid-19: Secondary | ICD-10-CM

## 2019-08-08 DIAGNOSIS — Z20828 Contact with and (suspected) exposure to other viral communicable diseases: Secondary | ICD-10-CM | POA: Insufficient documentation

## 2019-08-08 NOTE — Discharge Instructions (Signed)
Follow instruction below.  If you develop sick symptoms then go get tested.

## 2019-08-08 NOTE — ED Provider Notes (Signed)
[facility] Provider Note   CSN: [id number] Arrival date & time: 08/08/19  1211     History   Chief Complaint Chief Complaint  Patient presents with  . Follow-up    HPI Briana Russianshley K Zhang is a 38 y.o. female.     The history is provided by the patient. No language interpreter was used.     38 year old female with history of bipolar, schizophrenia, polysubstance abuse presenting with concerns of COVID-19 exposure.  Patient reports she was exposed to someone that was exposed to a covid-19 positive patient however patient is not having symptoms.  She mention to person who test positive COVID-19 was notified yesterday.  The person lives in the same house with her friend.  Her friend does not have any symptoms.  Patient does not have any symptoms.  No report of fever chills or cough or body aches.  She is here requesting for recommendation.  Patient is a smoker.  Past Medical History:  Diagnosis Date  . Bipolar 1 disorder (HCC)   . Cellulitis and abscess   . Paranoid schizophrenia (HCC)   . Substance abuse (HCC)    IVDU mulitple substances    Patient Active Problem List   Diagnosis Date Noted  . Chronic hepatitis C without hepatic coma (HCC) 10/23/2018  . Sepsis (HCC) 02/05/2017  . S/P C-section 06/18/2014  . Placental abruption in third trimester 06/14/2014  . Supervision of high risk pregnancy due to social problems in third trimester 05/27/2014  . 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 CESAREAN SECTION    . CESAREAN SECTION N/A 06/18/2014   Procedure: CESAREAN SECTION;  Surgeon: Adam PhenixJames G Arnold, MD;  Location: [facility];  Service: Obstetrics;  Laterality: N/A;  . WISDOM TOOTH EXTRACTION       OB History    Gravida  4   Para  3   Term  2   Preterm  1   AB  1   Living  3     SAB      TAB  1   Ectopic      Multiple      Live Births  3            Home  Medications    Prior to Admission medications   Medication Sig Start Date End Date Taking? Authorizing Provider  diclofenac (VOLTAREN) 75 MG EC tablet Take 1 tablet (75 mg total) by mouth 2 (two) times daily. Patient not taking: Reported on 01/20/2019 12/10/18   Elson AreasSofia, Leslie K, PA-C  FLUoxetine (PROZAC) 20 MG capsule Take 20 mg by mouth daily.    [provider]  gabapentin (NEURONTIN) 300 MG capsule Take 600 mg by mouth 2 (two) times daily.    [provider]    Family History Family History  Problem Relation Age of Onset  . Cancer Mother   . Depression Mother   . Diabetes Father   . Cancer Maternal Grandmother   . Cancer Maternal Grandfather     Social History Social History   Tobacco Use  . Smoking status: Current Every Day Smoker    Packs/day: 0.50    Types: E-cigarettes  . Smokeless tobacco: Never Used  Substance Use Topics  . Alcohol use: No  . Drug use: No    Types: Heroin    Comment: sober x 11 monrhs     Allergies   Ciprofloxacin   Review of  Systems Review of Systems  All other systems reviewed and are negative.    Physical Exam Updated Vital Signs BP 117/78 (BP Location: Right Arm)   Pulse 85   Temp 98.3 F (36.8 C) (Oral)   Resp 16   SpO2 99%   Physical Exam Vitals signs and nursing note reviewed.  Constitutional:      General: She is not in acute distress.    Appearance: She is well-developed.  HENT:     Head: Atraumatic.  Eyes:     Conjunctiva/sclera: Conjunctivae normal.  Neck:     Musculoskeletal: Neck supple.  Cardiovascular:     Rate and Rhythm: Normal rate and regular rhythm.     Pulses: Normal pulses.     Heart sounds: Normal heart sounds.  Pulmonary:     Effort: Pulmonary effort is normal.     Breath sounds: Normal breath sounds. No wheezing, rhonchi or rales.  Abdominal:     Palpations: Abdomen is soft.     Tenderness: There is no abdominal tenderness.  Skin:    Findings: No rash.  Neurological:      Mental Status: She is alert.      ED Treatments / Results  Labs (all labs ordered are listed, but only abnormal results are displayed) Labs Reviewed - No data to display  EKG None  Radiology No results found.  Procedures Procedures (including critical care time)  Medications Ordered in ED Medications - No data to display   Initial Impression / Assessment and Plan / ED Course  I have reviewed the triage vital signs and the nursing notes.  Pertinent labs & imaging results that were available during my care of the patient were reviewed by me and considered in my medical decision making (see chart for details).        BP 117/78 (BP Location: Right Arm)   Pulse 85   Temp 98.3 F (36.8 C) (Oral)   Resp 16   SpO2 99%    Final Clinical Impressions(s) / ED Diagnoses   Final diagnoses:  Exposure to Covid-19 Virus    ED Discharge Orders    None     12:39 PM Patient who was exposed to another person who was exposed to someone recently tested positive for COVID-19.  She does not have any symptoms.  I recommend patient to monitor her health for the next 10 days and if she develop any symptoms to go get tested.  At this time she does not need to be tested for COVID-19.  Also recommend physical distancing, wearing mask, and washing regularly.  Briana Zhang was evaluated in Emergency Department on 08/08/2019 for the symptoms described in the history of present illness. She was evaluated in the context of the global COVID-19 pandemic, which necessitated consideration that the patient might be at risk for infection with the SARS-CoV-2 virus that causes COVID-19. Institutional protocols and algorithms that pertain to the evaluation of patients at risk for COVID-19 are in a state of rapid change based on information released by regulatory bodies including the CDC and federal and state organizations. These policies and algorithms were followed during the patient's care in the ED.     Domenic Moras, PA-C 08/08/19 1242    Pattricia Boss, MD 08/08/19 [phone]

## 2019-08-08 NOTE — ED Triage Notes (Addendum)
States was exposed  To some on that was exposed to a COVID positive pt, not having ANY s/s at all

## 2020-06-11 ENCOUNTER — Encounter (HOSPITAL_COMMUNITY): Payer: Self-pay | Admitting: Psychiatric/Mental Health

## 2020-06-11 ENCOUNTER — Telehealth (INDEPENDENT_AMBULATORY_CARE_PROVIDER_SITE_OTHER): Payer: No Payment, Other | Admitting: Psychiatric/Mental Health

## 2020-06-11 ENCOUNTER — Other Ambulatory Visit: Payer: Self-pay

## 2020-06-11 DIAGNOSIS — F1121 Opioid dependence, in remission: Secondary | ICD-10-CM | POA: Diagnosis not present

## 2020-06-11 DIAGNOSIS — F1021 Alcohol dependence, in remission: Secondary | ICD-10-CM | POA: Insufficient documentation

## 2020-06-11 DIAGNOSIS — F3162 Bipolar disorder, current episode mixed, moderate: Secondary | ICD-10-CM

## 2020-06-11 DIAGNOSIS — F1120 Opioid dependence, uncomplicated: Secondary | ICD-10-CM

## 2020-06-11 DIAGNOSIS — F1421 Cocaine dependence, in remission: Secondary | ICD-10-CM | POA: Insufficient documentation

## 2020-06-11 DIAGNOSIS — F411 Generalized anxiety disorder: Secondary | ICD-10-CM | POA: Diagnosis not present

## 2020-06-11 HISTORY — DX: Generalized anxiety disorder: F41.1

## 2020-06-11 MED ORDER — FLUOXETINE HCL 20 MG PO CAPS: 20.0000 mg | ORAL_CAPSULE | Freq: Every day | ORAL | 2 refills | Status: DC

## 2020-06-11 NOTE — Progress Notes (Signed)
Psychiatric Initial Adult Assessment   Virtual Visit via Video Note   I connected with  Briana Zhang on 06/11/20 by a video enabled telemedicine application and verified that I am speaking with the correct person using two identifiers.   Location: Patient: Home Provider: [place] home office   I discussed the limitations of evaluation and management by telemedicine and the availability of in person appointments. The patient expressed understanding and agreed to proceed.   I provided 60 minutes of non-face-to-face time during this encounter.   Patient Identification: Briana Zhang MRN:  [id number] Date of Evaluation:  06/11/2020 Referral Source: Vesta Mixer Chief Complaint:  "My mood is good" Visit Diagnosis:    ICD-10-CM   1. Bipolar 1 disorder, mixed, moderate (HCC)  F31.62 FLUoxetine (PROZAC) 20 MG capsule  2. GAD (generalized anxiety disorder)  F41.1   3. Alcohol dependence, in remission (HCC)  F10.21   4. Narcotic dependence, in remission (HCC)  F11.21   5. Cocaine dependence in remission [facility])  F14.21     History of Present Illness: Briana Zhang is a 39 year old female seen today for initial psych evaluation.  Patient was referred to outpatient psychiatry by [facility] for medication management. Brocha has a dx of Bipolar 1 disorder, GAD, and former polysubstance abuse. On assessment  She report good mood, good sleep (currently taking 12mg  of melatonin nightly), goo appetite, and denies cravings for former drug use which consisted of crack and opioids.  She is 3 years sober.  She report stability on current meds and used to see her [facility] provider every three months. However, she stated that sometimes in between appts, she feels as though her moods go to an extreme where she reports becoming irritable, hostile, yelling and what she refers to as "uncontrollable.  She asked if this was normal behavior. Writer asked if she noticed if these moods were during her menstrual cycle. She then stated that  she recently got an miraina implant put in for birth control.  Advised pt to journal mood swings from now to 6 weeks so that we may have more concrete information to assess before clinical judgement can be made.  Pt agreed. Pt denies SI, HI, and AVH. Patient reports that she is doing well on current medication regimen.  She is agreeable to continue medications as prescribed and requested medication refills. Will follow-up in 6 weeks.    Associated Signs/Symptoms: Depression Symptoms:  na (Hypo) Manic Symptoms:  na Anxiety Symptoms:  na Psychotic Symptoms:  na PTSD Symptoms: NA  Past Psychiatric History: Bipolar 1, GAD, and PTSD  Previous Psychotropic Medications: No   Substance Abuse History in the last 12 months:  No.  Consequences of Substance Abuse: NA  Past Medical History:  Past Medical History:  Diagnosis Date  . Bipolar 1 disorder (HCC)   . Cellulitis and abscess   . Paranoid schizophrenia (HCC)   . Substance abuse (HCC)    IVDU mulitple substances    Past Surgical History:  Procedure Laterality Date  . CESAREAN SECTION    . CESAREAN SECTION N/A 06/18/2014   Procedure: CESAREAN SECTION;  Surgeon: 06/20/2014, MD;  Location: [facility];  Service: Obstetrics;  Laterality: N/A;  . WISDOM TOOTH EXTRACTION      Family Psychiatric History: unknown  Family History:  Family History  Problem Relation Age of Onset  . Cancer Mother   . Depression Mother   . Diabetes Father   . Cancer Maternal Grandmother   . Cancer Maternal Grandfather  Social History:   Social History   Socioeconomic History  . Marital status: Single    Spouse name: Not on file  . Number of children: Not on file  . Years of education: Not on file  . Highest education level: Not on file  Occupational History  . Occupation: Financial planner   Tobacco Use  . Smoking status: Current Every Day Smoker    Packs/day: 0.50    Types: E-cigarettes  . Smokeless tobacco: Current User  Vaping Use  .  Vaping Use: Every day  Substance and Sexual Activity  . Alcohol use: No  . Drug use: No    Types: Heroin    Comment: sober x 11 monrhs  . Sexual activity: Yes    Birth control/protection: None  Other Topics Concern  . Not on file  Social History Narrative  . Not on file   Social Determinants of Health   Financial Resource Strain:   . Difficulty of Paying Living Expenses:   Food Insecurity:   . Worried About Programme researcher, broadcasting/film/video in the Last Year:   . Barista in the Last Year:   Transportation Needs:   . Freight forwarder (Medical):   Marland Kitchen Lack of Transportation (Non-Medical):   Physical Activity:   . Days of Exercise per Week:   . Minutes of Exercise per Session:   Stress:   . Feeling of Stress :   Social Connections:   . Frequency of Communication with Friends and Family:   . Frequency of Social Gatherings with Friends and Family:   . Attends Religious Services:   . Active Member of Clubs or Organizations:   . Attends Banker Meetings:   Marland Kitchen Marital Status:     Additional Social History: unknown  Allergies:   Allergies  Allergen Reactions  . Ciprofloxacin Swelling and Rash    Metabolic Disorder Labs: No results found for: HGBA1C, MPG No results found for: PROLACTIN No results found for: CHOL, TRIG, HDL, CHOLHDL, VLDL, LDLCALC Lab Results  Component Value Date   TSH 0.739 05/07/2014    Therapeutic Level Labs: No results found for: LITHIUM No results found for: CBMZ No results found for: VALPROATE  Current Medications: Current Outpatient Medications  Medication Sig Dispense Refill  . diclofenac (VOLTAREN) 75 MG EC tablet Take 1 tablet (75 mg total) by mouth 2 (two) times daily. (Patient not taking: Reported on 01/20/2019) 20 tablet 0  . FLUoxetine (PROZAC) 20 MG capsule Take 1 capsule (20 mg total) by mouth daily. 30 capsule 2  . gabapentin (NEURONTIN) 300 MG capsule Take 600 mg by mouth 2 (two) times daily.     No current  facility-administered medications for this visit.    Musculoskeletal: Strength & Muscle Tone: within normal limits Gait & Station: normal Patient leans: N/A  Psychiatric Specialty Exam: Review of Systems  Psychiatric/Behavioral: Negative for sleep disturbance and suicidal ideas. The patient is not nervous/anxious.   All other systems reviewed and are negative.   There were no vitals taken for this visit.There is no height or weight on file to calculate BMI.  General Appearance: Casual  Eye Contact:  Good  Speech:  Clear and Coherent  Volume:  Normal  Mood:  Euthymic  Affect:  Appropriate and Congruent  Thought Process:  Coherent and Descriptions of Associations: Intact  Orientation:  Full (Time, Place, and Person)  Thought Content:  WDL  Suicidal Thoughts:  No  Homicidal Thoughts:  No  Memory:  Immediate;  Good  Judgement:  Intact  Insight:  Good  Psychomotor Activity:  Normal  Concentration:  Attention Span: Good  Recall:  Good  Fund of Knowledge:Good  Language: Good  Akathisia:  NA  Handed:  Right  AIMS (if indicated):    Assets:  Communication Skills Desire for Improvement Financial Resources/Insurance Social Support Talents/Skills Transportation  ADL's:  Intact  Cognition: WNL  Sleep:  Good   Screenings: PHQ2-9     [facility] Visit from 10/23/[facility] in [facility] for [facility]ious Disease  PHQ-2 Total Score 0      Assessment and Plan: Patient reports that she is doing well on current medication regimen.  She is agreeable to continue medications as prescribed and requested medication refills. Will follow-up in 6 weeks.   ICD-10-CM   1. Bipolar 1 disorder, mixed, moderate (HCC)  F31.62 FLUoxetine (PROZAC) 20 MG capsule  2. GAD (generalized anxiety disorder)  F41.1   3. Alcohol dependence, in remission ([facility])  F10.21   4. Narcotic dependence, in remission ([place])  F11.21   5. Cocaine dependence in remission [facility])  F14.21       Deloria Lair,  NP 6/18/202112:31 PM

## 2020-07-27 ENCOUNTER — Telehealth (HOSPITAL_COMMUNITY): Payer: Self-pay | Admitting: *Deleted

## 2020-07-27 MED ORDER — GABAPENTIN 300 MG PO CAPS: 600.0000 mg | ORAL_CAPSULE | Freq: Two times a day (BID) | ORAL | 3 refills | Status: DC

## 2020-07-27 MED ORDER — FLUOXETINE HCL 20 MG PO CAPS: 20.0000 mg | ORAL_CAPSULE | Freq: Every day | ORAL | 2 refills | Status: DC

## 2020-07-27 NOTE — Telephone Encounter (Signed)
Called stating refills for Gabapentin was not called in when her Prozac Rx was called in. Will alert Ms Durwin Nora NP to this concern and ask her to call in RX to [place] at [phone].

## 2020-07-27 NOTE — Addendum Note (Signed)
Addended by: Lerry Liner on: 07/27/2020 03:26 PM   Modules accepted: Orders

## 2020-07-29 ENCOUNTER — Telehealth (HOSPITAL_COMMUNITY): Payer: Self-pay

## 2020-08-11 ENCOUNTER — Other Ambulatory Visit: Payer: Self-pay

## 2020-08-11 ENCOUNTER — Telehealth (HOSPITAL_COMMUNITY): Payer: No Payment, Other | Admitting: Psychiatry

## 2020-09-09 ENCOUNTER — Encounter (HOSPITAL_COMMUNITY): Payer: Self-pay | Admitting: Psychiatry

## 2020-09-09 ENCOUNTER — Telehealth (INDEPENDENT_AMBULATORY_CARE_PROVIDER_SITE_OTHER): Payer: No Payment, Other | Admitting: Psychiatry

## 2020-09-09 ENCOUNTER — Other Ambulatory Visit: Payer: Self-pay

## 2020-09-09 DIAGNOSIS — F1121 Opioid dependence, in remission: Secondary | ICD-10-CM | POA: Diagnosis not present

## 2020-09-09 DIAGNOSIS — F1421 Cocaine dependence, in remission: Secondary | ICD-10-CM

## 2020-09-09 DIAGNOSIS — F3178 Bipolar disorder, in full remission, most recent episode mixed: Secondary | ICD-10-CM

## 2020-09-09 DIAGNOSIS — F3162 Bipolar disorder, current episode mixed, moderate: Secondary | ICD-10-CM

## 2020-09-09 DIAGNOSIS — F1021 Alcohol dependence, in remission: Secondary | ICD-10-CM | POA: Diagnosis not present

## 2020-09-09 DIAGNOSIS — F411 Generalized anxiety disorder: Secondary | ICD-10-CM

## 2020-09-09 MED ORDER — FLUOXETINE HCL 20 MG PO CAPS: 20.0000 mg | ORAL_CAPSULE | Freq: Every day | ORAL | 2 refills | Status: DC

## 2020-09-09 MED ORDER — GABAPENTIN 600 MG PO TABS: 600.0000 mg | ORAL_TABLET | Freq: Two times a day (BID) | ORAL | 2 refills | Status: DC

## 2020-09-09 NOTE — Progress Notes (Signed)
BH MD/PA/NP OP Progress Note  Virtual Visit via Video Note  I connected with Briana Zhang on 09/09/20 at  1:00 PM EDT by a video enabled telemedicine application and verified that I am speaking with the correct person using two identifiers.  Location: Patient: Home Provider: Clinic   I discussed the limitations of evaluation and management by telemedicine and the availability of in person appointments. The patient expressed understanding and agreed to proceed.  I provided 16 minutes of non-face-to-face time during this encounter.    09/09/2020 1:06 PM Briana Zhang  MRN:  [id number]  Chief Complaint:  " I am doing well."   HPI: Patient reported that she is doing well.  She informed that she has been on her current regimen for a while and things have been manageable.  She stated that a few days ago she felt he was kind of depressed for about a week due to some ongoing stressors however she has been feeling better now.  She stated that she thinks his current regimen of medications is just fine and she does not think any adjustments need to be made at this point.  She denied any other concerns at this time.  She has started working at [employer] recently and so far is liking her job.    Visit Diagnosis:    ICD-10-CM   1. Bipolar 1 disorder, mixed, full remission (HCC)  F31.78     Past Psychiatric History: Bipolar d/o, polysubstance dependence in remission  Past Medical History:  Past Medical History:  Diagnosis Date  . Bipolar 1 disorder (HCC)   . Cellulitis and abscess   . Paranoid schizophrenia (HCC)   . Substance abuse (HCC)    IVDU mulitple substances    Past Surgical History:  Procedure Laterality Date  . CESAREAN SECTION    . CESAREAN SECTION N/A 06/18/2014   Procedure: CESAREAN SECTION;  Surgeon: Adam Phenix, MD;  Location: [facility];  Service: Obstetrics;  Laterality: N/A;  . WISDOM TOOTH EXTRACTION      Family Psychiatric History: Mom- depression  Family  History:  Family History  Problem Relation Age of Onset  . Cancer Mother   . Depression Mother   . Diabetes Father   . Cancer Maternal Grandmother   . Cancer Maternal Grandfather     Social History:  Social History   Socioeconomic History  . Marital status: Single    Spouse name: Not on file  . Number of children: Not on file  . Years of education: Not on file  . Highest education level: Not on file  Occupational History  . Occupation: Financial planner   Tobacco Use  . Smoking status: Current Every Day Smoker    Packs/day: 0.50    Types: E-cigarettes  . Smokeless tobacco: Current User  Vaping Use  . Vaping Use: Every day  Substance and Sexual Activity  . Alcohol use: No  . Drug use: No    Types: Heroin    Comment: sober x 11 monrhs  . Sexual activity: Yes    Birth control/protection: None  Other Topics Concern  . Not on file  Social History Narrative  . Not on file   Social Determinants of Health   Financial Resource Strain:   . Difficulty of Paying Living Expenses: Not on file  Food Insecurity:   . Worried About Programme researcher, broadcasting/film/video in the Last Year: Not on file  . Ran Out of Food in the Last Year: Not on file  Transportation Needs:   . Freight forwarder (Medical): Not on file  . Lack of Transportation (Non-Medical): Not on file  Physical Activity:   . Days of Exercise per Week: Not on file  . Minutes of Exercise per Session: Not on file  Stress:   . Feeling of Stress : Not on file  Social Connections:   . Frequency of Communication with Friends and Family: Not on file  . Frequency of Social Gatherings with Friends and Family: Not on file  . Attends Religious Services: Not on file  . Active Member of Clubs or Organizations: Not on file  . Attends Banker Meetings: Not on file  . Marital Status: Not on file    Allergies:  Allergies  Allergen Reactions  . Ciprofloxacin Swelling and Rash    Metabolic Disorder Labs: No results found  for: HGBA1C, MPG No results found for: PROLACTIN No results found for: CHOL, TRIG, HDL, CHOLHDL, VLDL, LDLCALC Lab Results  Component Value Date   TSH 0.739 05/07/2014    Therapeutic Level Labs: No results found for: LITHIUM No results found for: VALPROATE No components found for:  CBMZ  Current Medications: Current Outpatient Medications  Medication Sig Dispense Refill  . diclofenac (VOLTAREN) 75 MG EC tablet Take 1 tablet (75 mg total) by mouth 2 (two) times daily. (Patient not taking: Reported on 01/20/2019) 20 tablet 0  . FLUoxetine (PROZAC) 20 MG capsule Take 1 capsule (20 mg total) by mouth daily. 30 capsule 2  . gabapentin (NEURONTIN) 300 MG capsule Take 2 capsules (600 mg total) by mouth 2 (two) times daily. 120 capsule 3   No current facility-administered medications for this visit.        Psychiatric Specialty Exam: Review of Systems  There were no vitals taken for this visit.There is no height or weight on file to calculate BMI.  General Appearance: Fairly Groomed  Eye Contact:  Good  Speech:  Clear and Coherent and Normal Rate  Volume:  Normal  Mood:  Euthymic  Affect:  Congruent  Thought Process:  Goal Directed and Descriptions of Associations: Intact  Orientation:  Full (Time, Place, and Person)  Thought Content: Logical   Suicidal Thoughts:  No  Homicidal Thoughts:  No  Memory:  Immediate;   Good Recent;   Good  Judgement:  Fair  Insight:  Fair  Psychomotor Activity:  Normal  Concentration:  Concentration: Good and Attention Span: Good  Recall:  Good  Fund of Knowledge: Good  Language: Good  Akathisia:  Negative  Handed:  Right  AIMS (if indicated): 0  Assets:  Communication Skills Desire for Improvement Financial Resources/Insurance Housing  ADL's:  Intact  Cognition: WNL  Sleep:  Good   Screenings: PHQ2-9     Office Visit from 10/23/2018 in [facility] for Infectious Disease  PHQ-2 Total Score 0       Assessment and  Plan: Patient appears to be stable on her current regimen.  1. Bipolar 1 disorder, mixed, full remission (HCC)  - FLUoxetine (PROZAC) 20 MG capsule; Take 1 capsule (20 mg total) by mouth daily.  Dispense: 30 capsule; Refill: 2 - gabapentin (NEURONTIN) 600 MG tablet; Take 1 tablet (600 mg total) by mouth 2 (two) times daily.  Dispense: 60 tablet; Refill: 2  2. Alcohol dependence, in remission (HCC)   3. Narcotic dependence, in remission (HCC)   4. 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 Follow up in 3 months.   Cyerra Yim  Evelene Croon, MD 09/09/2020, 1:06 PM

## 2020-11-12 IMAGING — CR DG KNEE COMPLETE 4+V*R*
4 series · 4 of 4 positions shown · non-contrast
Comparison: None.

CLINICAL DATA: Right medial knee pain.

EXAM:
RIGHT KNEE - COMPLETE 4+ VIEW

[t knee ap right]
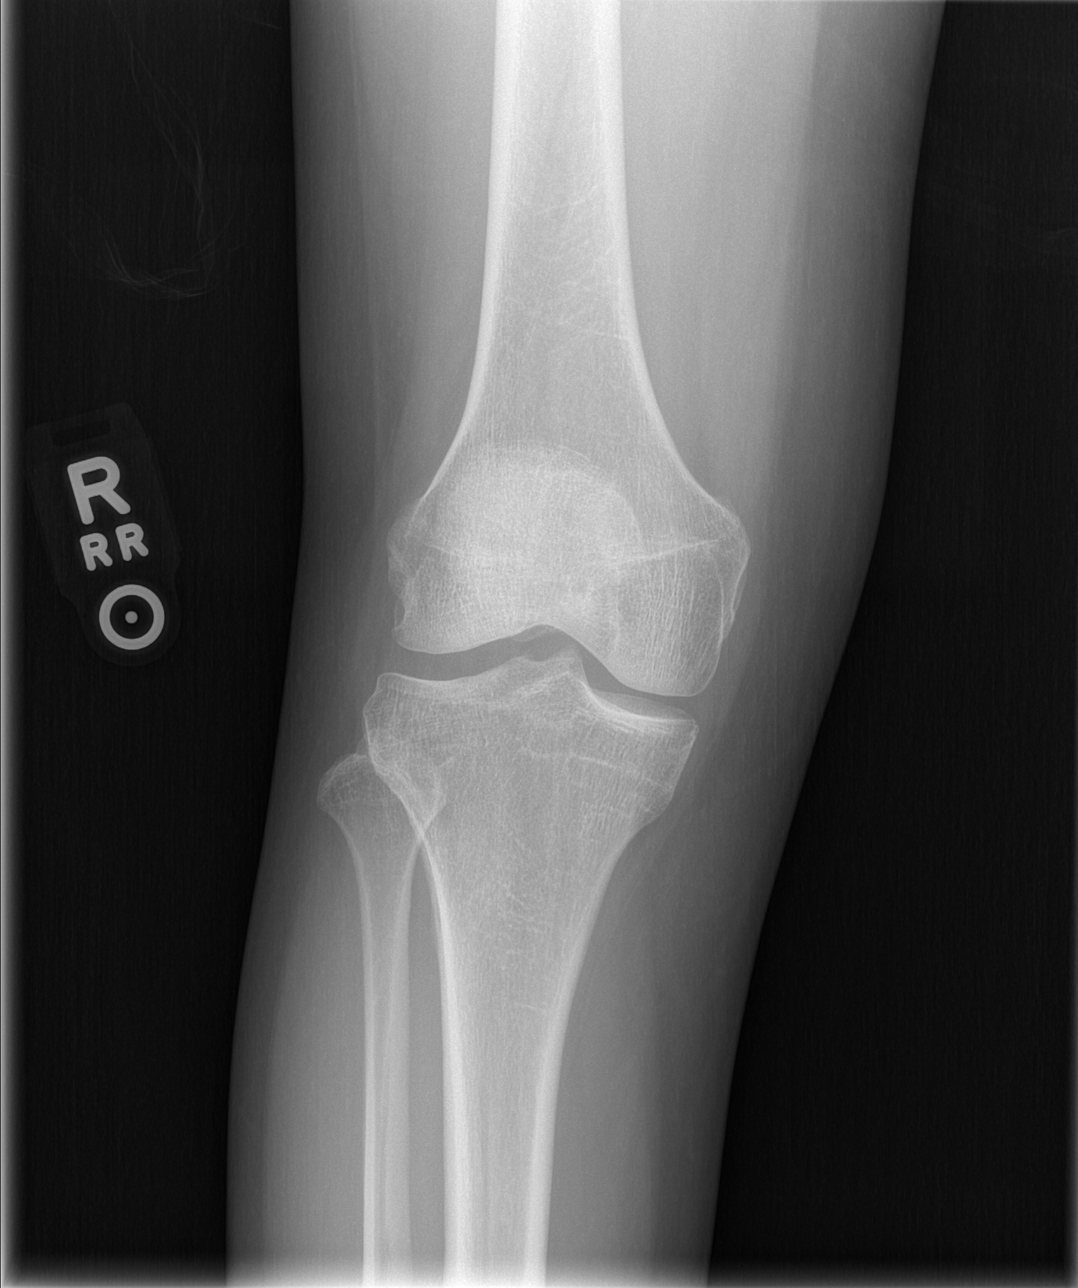

[t knee oblique right (1 of 2)]
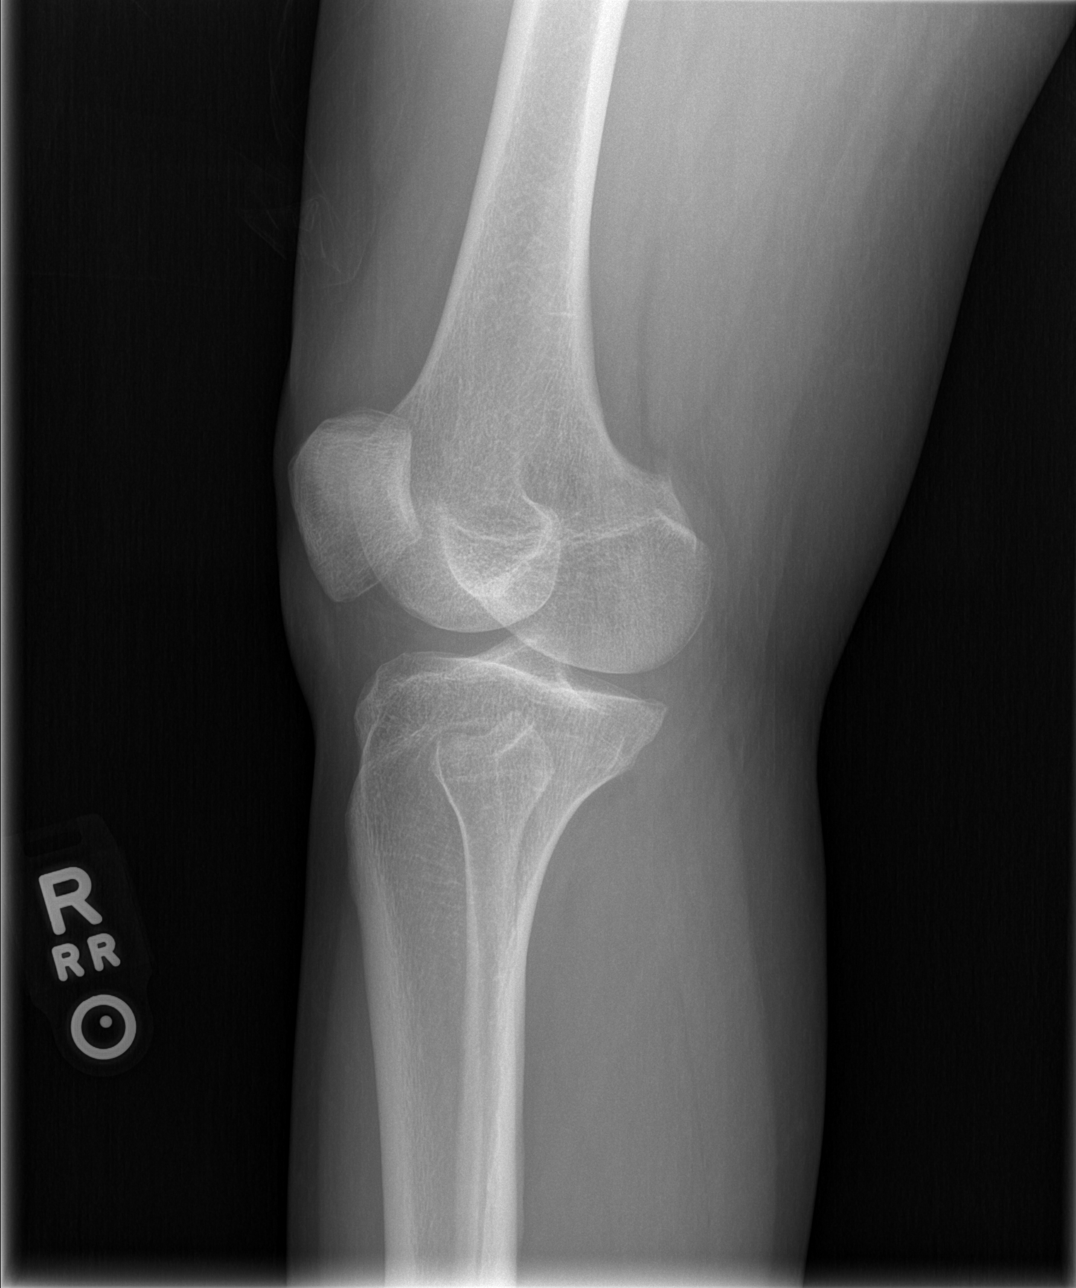

[t knee oblique right (2 of 2)]
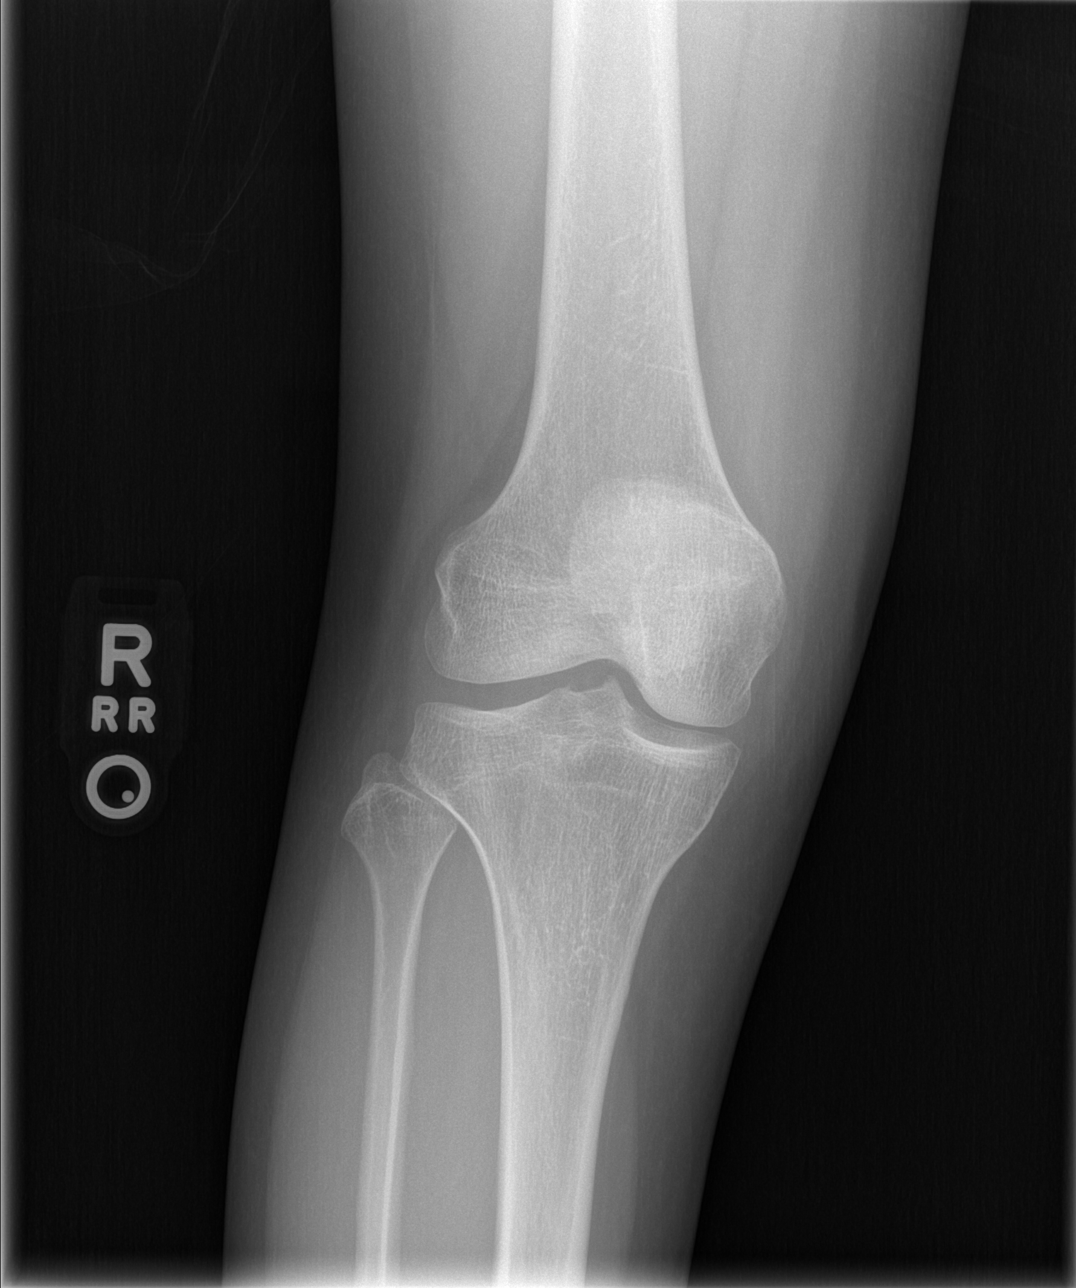

[t knee lat right]
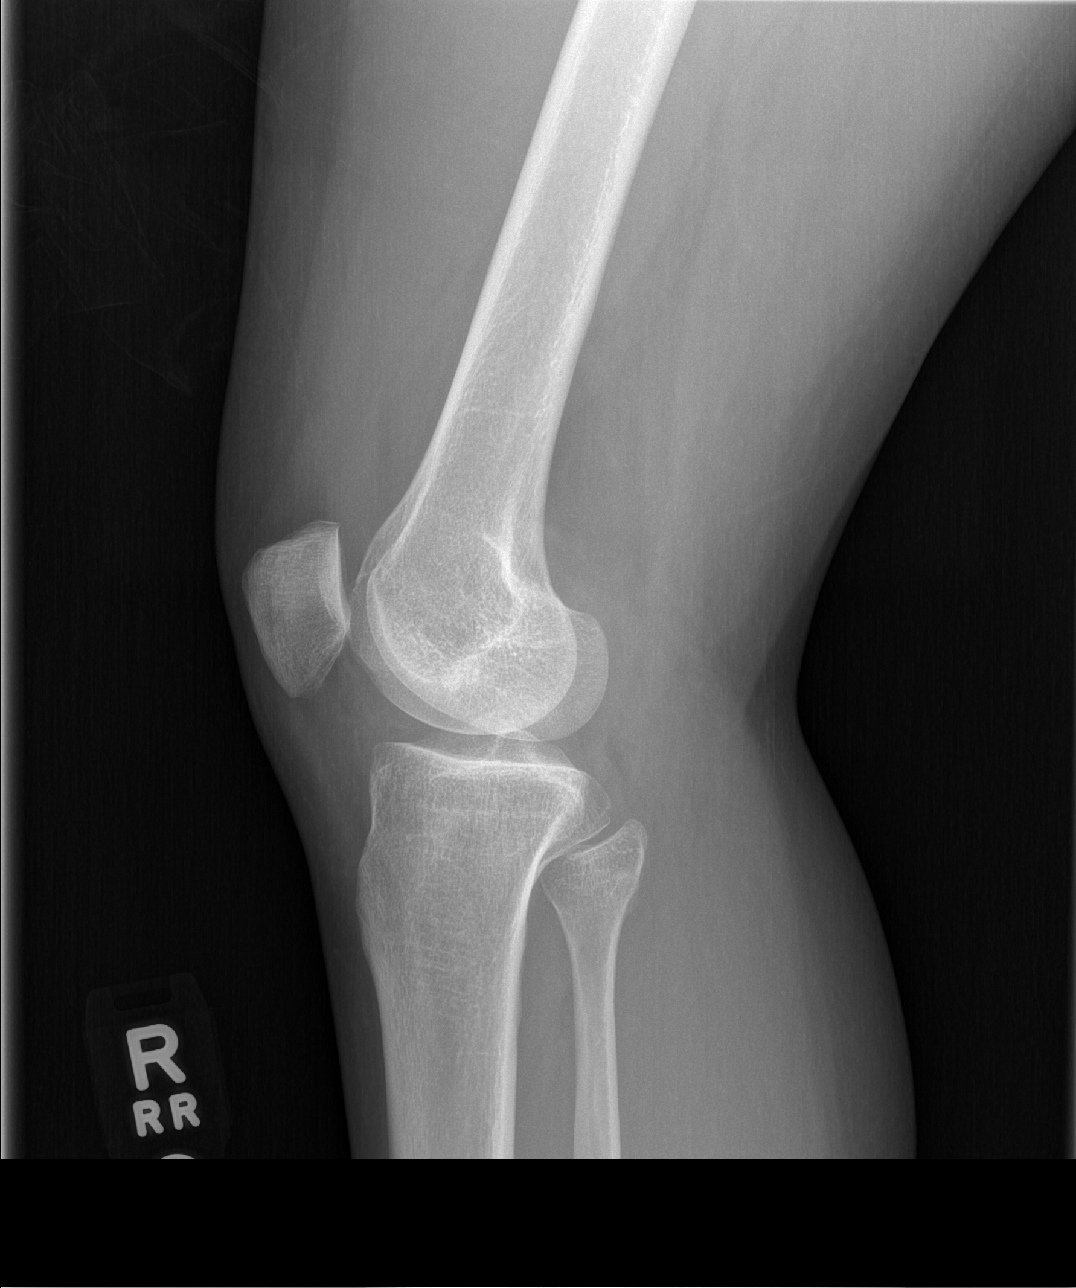

[4 of 4 positions shown; findings below may reference images not displayed]

FINDINGS: No evidence of fracture, dislocation, or joint effusion. No evidence
of arthropathy or other focal bone abnormality. Soft tissues are
unremarkable.
IMPRESSION: Negative.

## 2020-12-06 ENCOUNTER — Other Ambulatory Visit: Payer: Self-pay

## 2020-12-06 ENCOUNTER — Telehealth (HOSPITAL_COMMUNITY): Payer: No Payment, Other | Admitting: Psychiatry

## 2021-01-04 ENCOUNTER — Encounter (HOSPITAL_COMMUNITY): Payer: Self-pay | Admitting: Psychiatry

## 2021-01-04 ENCOUNTER — Other Ambulatory Visit: Payer: Self-pay

## 2021-01-04 ENCOUNTER — Telehealth (INDEPENDENT_AMBULATORY_CARE_PROVIDER_SITE_OTHER): Payer: No Payment, Other | Admitting: Psychiatry

## 2021-01-04 DIAGNOSIS — F1121 Opioid dependence, in remission: Secondary | ICD-10-CM

## 2021-01-04 DIAGNOSIS — F1021 Alcohol dependence, in remission: Secondary | ICD-10-CM

## 2021-01-04 DIAGNOSIS — F3178 Bipolar disorder, in full remission, most recent episode mixed: Secondary | ICD-10-CM

## 2021-01-04 DIAGNOSIS — F1421 Cocaine dependence, in remission: Secondary | ICD-10-CM | POA: Diagnosis not present

## 2021-01-04 MED ORDER — GABAPENTIN 600 MG PO TABS: 600.0000 mg | ORAL_TABLET | Freq: Two times a day (BID) | ORAL | 2 refills | Status: DC

## 2021-01-04 MED ORDER — FLUOXETINE HCL 20 MG PO CAPS: 20.0000 mg | ORAL_CAPSULE | Freq: Every day | ORAL | 2 refills | Status: DC

## 2021-01-04 NOTE — Progress Notes (Signed)
BH MD/PA/NP OP Progress Note  Virtual Visit via Telephone Note  I connected with Briana Zhang on 01/04/21 at  9:00 AM EST by telephone and verified that I am speaking with the correct person using two identifiers.  Location: Patient: home Provider: Clinic   I discussed the limitations, risks, security and privacy concerns of performing an evaluation and management service by telephone and the availability of in person appointments. I also discussed with the patient that there may be a patient responsible charge related to this service. The patient expressed understanding and agreed to proceed.   I provided 13 minutes of non-face-to-face time during this encounter.    01/04/2021 9:04 AM Briana Zhang  MRN:  [id number]  Chief Complaint:  " I am doing well right now."   HPI: Pt reported that she recently got her driver's license back and that has been very helpful.  She informed that she is looking forward to starting her old job again.  She also is trying to find another job position to help her do better financially.  She also wants to go back to school however given her legal history in the past she is a little worried about whether she will be able to achieve what she wants to achieve. She informed that in the past sometimes she would get more depressed in the wintertime than usual but this year has been okay and her mood has been stable.  She does not feel she needs to adjust her medicines at this time. She informed that she is currently in NA meeting and she has been attending them regularly. Writer applauded her for her progressive plans and encouraged her to maintain her sobriety by attending her NA meetings.  We will continue the same regimen for now.   Visit Diagnosis:    ICD-10-CM   1. Bipolar 1 disorder, mixed, full remission (HCC)  F31.78   2. Alcohol dependence, in remission (HCC)  F10.21   3. Narcotic dependence, in remission (HCC)  F11.21   4. Cocaine dependence in  remission (HCC)  F14.21     Past Psychiatric History: Bipolar d/o, polysubstance dependence in remission  Past Medical History:  Past Medical History:  Diagnosis Date  . Bipolar 1 disorder (HCC)   . Cellulitis and abscess   . Paranoid schizophrenia (HCC)   . Substance abuse (HCC)    IVDU mulitple substances    Past Surgical History:  Procedure Laterality Date  . CESAREAN SECTION    . CESAREAN SECTION N/A 06/18/2014   Procedure: CESAREAN SECTION;  Surgeon: Adam Phenix, MD;  Location: [facility];  Service: Obstetrics;  Laterality: N/A;  . WISDOM TOOTH EXTRACTION      Family Psychiatric History: Mom- depression  Family History:  Family History  Problem Relation Age of Onset  . Cancer Mother   . Depression Mother   . Diabetes Father   . Cancer Maternal Grandmother   . Cancer Maternal Grandfather     Social History:  Social History   Socioeconomic History  . Marital status: Single    Spouse name: Not on file  . Number of children: Not on file  . Years of education: Not on file  . Highest education level: Not on file  Occupational History  . Occupation: Financial planner   Tobacco Use  . Smoking status: Current Every Day Smoker    Packs/day: 0.50    Types: E-cigarettes  . Smokeless tobacco: Current User  Vaping Use  . Vaping Use:  Every day  Substance and Sexual Activity  . Alcohol use: No  . Drug use: No    Types: Heroin    Comment: sober x 11 monrhs  . Sexual activity: Yes    Birth control/protection: None  Other Topics Concern  . Not on file  Social History Narrative  . Not on file   Social Determinants of Health   Financial Resource Strain: Not on file  Food Insecurity: Not on file  Transportation Needs: Not on file  Physical Activity: Not on file  Stress: Not on file  Social Connections: Not on file    Allergies:  Allergies  Allergen Reactions  . Ciprofloxacin Swelling and Rash    Metabolic Disorder Labs: No results found for: HGBA1C, MPG No  results found for: PROLACTIN No results found for: CHOL, TRIG, HDL, CHOLHDL, VLDL, LDLCALC Lab Results  Component Value Date   TSH 0.739 05/07/2014    Therapeutic Level Labs: No results found for: LITHIUM No results found for: VALPROATE No components found for:  CBMZ  Current Medications: Current Outpatient Medications  Medication Sig Dispense Refill  . diclofenac (VOLTAREN) 75 MG EC tablet Take 1 tablet (75 mg total) by mouth 2 (two) times daily. (Patient not taking: Reported on 01/20/2019) 20 tablet 0  . FLUoxetine (PROZAC) 20 MG capsule Take 1 capsule (20 mg total) by mouth daily. 30 capsule 2  . gabapentin (NEURONTIN) 600 MG tablet Take 1 tablet (600 mg total) by mouth 2 (two) times daily. 60 tablet 2   No current facility-administered medications for this visit.        Psychiatric Specialty Exam: Review of Systems  There were no vitals taken for this visit.There is no height or weight on file to calculate BMI.  General Appearance: unable to assess due to phone visit  Eye Contact:  unable to assess due to phone visit  Speech:  Clear and Coherent and Normal Rate  Volume:  Normal  Mood:  Euthymic  Affect:  Congruent  Thought Process:  Goal Directed and Descriptions of Associations: Intact  Orientation:  Full (Time, Place, and Person)  Thought Content: Logical   Suicidal Thoughts:  No  Homicidal Thoughts:  No  Memory:  Immediate;   Good Recent;   Good  Judgement:  Fair  Insight:  Fair  Psychomotor Activity:  Normal  Concentration:  Concentration: Good and Attention Span: Good  Recall:  Good  Fund of Knowledge: Good  Language: Good  Akathisia:  Negative  Handed:  Right  AIMS (if indicated): 0  Assets:  Communication Skills Desire for Improvement Financial Resources/Insurance Housing  ADL's:  Intact  Cognition: WNL  Sleep:  Good   Screenings: PHQ2-9   Flowsheet Row Office Visit from 10/23/2018 in [facility] for [facility]ious Disease  PHQ-2  Total Score 0       Assessment and Plan: Pt is doing well on her current regimen. Continue same regimen for now.  1. Bipolar 1 disorder, mixed, full remission (HCC)  - FLUoxetine (PROZAC) 20 MG capsule; Take 1 capsule (20 mg total) by mouth daily.  Dispense: 30 capsule; Refill: 2 - gabapentin (NEURONTIN) 600 MG tablet; Take 1 tablet (600 mg total) by mouth 2 (two) times daily.  Dispense: 60 tablet; Refill: 2  2. Alcohol dependence, in remission (HCC)   3. Narcotic dependence, in remission (HCC)   4. Cocaine dependence in remission [facility])  Attends NA meetings regularly. Continue same medication regimen. Follow up in 3 months.   Zena Amos,  MD 01/04/2021, 9:04 AM

## 2021-03-29 ENCOUNTER — Telehealth (HOSPITAL_COMMUNITY): Payer: Self-pay

## 2021-03-29 ENCOUNTER — Telehealth (HOSPITAL_COMMUNITY): Payer: No Payment, Other | Admitting: Psychiatry

## 2021-03-29 ENCOUNTER — Other Ambulatory Visit: Payer: Self-pay

## 2021-03-29 DIAGNOSIS — F3178 Bipolar disorder, in full remission, most recent episode mixed: Secondary | ICD-10-CM

## 2021-03-29 MED ORDER — FLUOXETINE HCL 20 MG PO CAPS: 20.0000 mg | ORAL_CAPSULE | Freq: Every day | ORAL | 0 refills | Status: DC

## 2021-03-29 MED ORDER — GABAPENTIN 600 MG PO TABS: 600.0000 mg | ORAL_TABLET | Freq: Two times a day (BID) | ORAL | 0 refills | Status: DC

## 2021-03-29 NOTE — Telephone Encounter (Signed)
Informed patient

## 2021-03-29 NOTE — Telephone Encounter (Signed)
Patient called requesting refills on her Fluoxetine 20mg  and her Gabapentin 600mg  to be sent to [facility] on [address]. Appointment rescheduled for 5/6. Please review and advise. Thank you

## 2021-03-29 NOTE — Telephone Encounter (Signed)
Rx for 30 days sent

## 2021-04-29 ENCOUNTER — Other Ambulatory Visit: Payer: Self-pay

## 2021-04-29 ENCOUNTER — Encounter (HOSPITAL_COMMUNITY): Payer: Self-pay | Admitting: Psychiatry

## 2021-04-29 ENCOUNTER — Telehealth (INDEPENDENT_AMBULATORY_CARE_PROVIDER_SITE_OTHER): Payer: No Payment, Other | Admitting: Psychiatry

## 2021-04-29 DIAGNOSIS — F1021 Alcohol dependence, in remission: Secondary | ICD-10-CM | POA: Diagnosis not present

## 2021-04-29 DIAGNOSIS — F1421 Cocaine dependence, in remission: Secondary | ICD-10-CM | POA: Diagnosis not present

## 2021-04-29 DIAGNOSIS — F1121 Opioid dependence, in remission: Secondary | ICD-10-CM

## 2021-04-29 DIAGNOSIS — F3178 Bipolar disorder, in full remission, most recent episode mixed: Secondary | ICD-10-CM

## 2021-04-29 MED ORDER — GABAPENTIN 600 MG PO TABS: 600.0000 mg | ORAL_TABLET | Freq: Two times a day (BID) | ORAL | 2 refills | Status: DC

## 2021-04-29 MED ORDER — FLUOXETINE HCL 20 MG PO CAPS: 20.0000 mg | ORAL_CAPSULE | Freq: Every day | ORAL | 2 refills | Status: DC

## 2021-04-29 NOTE — Progress Notes (Signed)
BH MD/PA/NP OP Progress Note  Virtual Visit via Telephone Note  I connected with Briana Zhang on 04/29/21 at 11:20 AM EDT by telephone and verified that I am speaking with the correct person using two identifiers.  Location: Patient: home Provider: Clinic   I discussed the limitations, risks, security and privacy concerns of performing an evaluation and management service by telephone and the availability of in person appointments. I also discussed with the patient that there may be a patient responsible charge related to this service. The patient expressed understanding and agreed to proceed.   I provided 14 minutes of non-face-to-face time during this encounter.     04/29/2021 10:48 AM Briana Zhang  MRN:  [id number]  Chief Complaint:  " Doing well."   HPI: Patient reported that she is doing well.  She informed that her mood has been stable.  She has not relapsed on any of the illicit substances or alcohol. She has been attending her NA meetings regularly.  She has been taking her medications regularly. She denies any specific issues or concerns pertaining to her mood.  She is sleeping well at night. She stated that things are going well and she would like to continue same regimen.  Visit Diagnosis:    ICD-10-CM   1. Bipolar 1 disorder, mixed, full remission (HCC)  F31.78 FLUoxetine (PROZAC) 20 MG capsule    gabapentin (NEURONTIN) 600 MG tablet  2. Alcohol dependence, in remission (HCC)  F10.21   3. Narcotic dependence, in remission (HCC)  F11.21   4. Cocaine dependence in remission (HCC)  F14.21     Past Psychiatric History: Bipolar d/o, polysubstance dependence in remission  Past Medical History:  Past Medical History:  Diagnosis Date  . Bipolar 1 disorder (HCC)   . Cellulitis and abscess   . Paranoid schizophrenia (HCC)   . Substance abuse (HCC)    IVDU mulitple substances    Past Surgical History:  Procedure Laterality Date  . CESAREAN SECTION    .  CESAREAN SECTION N/A 06/18/2014   Procedure: CESAREAN SECTION;  Surgeon: Adam Phenix, MD;  Location: [facility];  Service: Obstetrics;  Laterality: N/A;  . WISDOM TOOTH EXTRACTION      Family Psychiatric History: Mom- depression  Family History:  Family History  Problem Relation Age of Onset  . Cancer Mother   . Depression Mother   . Diabetes Father   . Cancer Maternal Grandmother   . Cancer Maternal Grandfather     Social History:  Social History   Socioeconomic History  . Marital status: Single    Spouse name: Not on file  . Number of children: Not on file  . Years of education: Not on file  . Highest education level: Not on file  Occupational History  . Occupation: Financial planner   Tobacco Use  . Smoking status: Current Every Day Smoker    Packs/day: 0.50    Types: E-cigarettes  . Smokeless tobacco: Current User  Vaping Use  . Vaping Use: Every day  Substance and Sexual Activity  . Alcohol use: No  . Drug use: No    Types: Heroin    Comment: sober x 11 monrhs  . Sexual activity: Yes    Birth control/protection: None  Other Topics Concern  . Not on file  Social History Narrative  . Not on file   Social Determinants of Health   Financial Resource Strain: Not on file  Food Insecurity: Not on file  Transportation Needs: Not  on file  Physical Activity: Not on file  Stress: Not on file  Social Connections: Not on file    Allergies:  Allergies  Allergen Reactions  . Ciprofloxacin Swelling and Rash    Metabolic Disorder Labs: No results found for: HGBA1C, MPG No results found for: PROLACTIN No results found for: CHOL, TRIG, HDL, CHOLHDL, VLDL, LDLCALC Lab Results  Component Value Date   TSH 0.739 05/07/2014    Therapeutic Level Labs: No results found for: LITHIUM No results found for: VALPROATE No components found for:  CBMZ  Current Medications: Current Outpatient Medications  Medication Sig Dispense Refill  . FLUoxetine (PROZAC) 20 MG  capsule Take 1 capsule (20 mg total) by mouth daily. 30 capsule 2  . gabapentin (NEURONTIN) 600 MG tablet Take 1 tablet (600 mg total) by mouth 2 (two) times daily. 60 tablet 2   No current facility-administered medications for this visit.        Psychiatric Specialty Exam: Review of Systems  There were no vitals taken for this visit.There is no height or weight on file to calculate BMI.  General Appearance: unable to assess due to phone visit  Eye Contact:  unable to assess due to phone visit  Speech:  Clear and Coherent and Normal Rate  Volume:  Normal  Mood:  Euthymic  Affect:  Congruent  Thought Process:  Goal Directed and Descriptions of Associations: Intact  Orientation:  Full (Time, Place, and Person)  Thought Content: Logical   Suicidal Thoughts:  No  Homicidal Thoughts:  No  Memory:  Immediate;   Good Recent;   Good  Judgement:  Fair  Insight:  Fair  Psychomotor Activity:  Normal  Concentration:  Concentration: Good and Attention Span: Good  Recall:  Good  Fund of Knowledge: Good  Language: Good  Akathisia:  Negative  Handed:  Right  AIMS (if indicated): 0  Assets:  Communication Skills Desire for Improvement Financial Resources/Insurance Housing  ADL's:  Intact  Cognition: WNL  Sleep:  Good   Screenings: PHQ2-9   Flowsheet Row Office Visit from 10/23/2018 in [facility] for Infectious Disease  PHQ-2 Total Score 0       Assessment and Plan: Patient is stable on current regimen.  She has been attending an AA meetings regularly.  1. Bipolar 1 disorder, mixed, full remission (HCC)  - FLUoxetine (PROZAC) 20 MG capsule; Take 1 capsule (20 mg total) by mouth daily.  Dispense: 30 capsule; Refill: 2 - gabapentin (NEURONTIN) 600 MG tablet; Take 1 tablet (600 mg total) by mouth 2 (two) times daily.  Dispense: 60 tablet; Refill: 2  2. Alcohol dependence, in remission (HCC)   3. Narcotic dependence, in remission (HCC)   4. Cocaine  dependence in remission (HCC)   Continue same medication regimen. Follow up in 3 months.   Zena Amos, MD 04/29/2021, 10:48 AM

## 2021-07-29 ENCOUNTER — Encounter (HOSPITAL_COMMUNITY): Payer: Self-pay | Admitting: Psychiatry

## 2021-07-29 ENCOUNTER — Telehealth (INDEPENDENT_AMBULATORY_CARE_PROVIDER_SITE_OTHER): Payer: No Payment, Other | Admitting: Psychiatry

## 2021-07-29 DIAGNOSIS — F3178 Bipolar disorder, in full remission, most recent episode mixed: Secondary | ICD-10-CM

## 2021-07-29 MED ORDER — FLUOXETINE HCL 20 MG PO CAPS: 20.0000 mg | ORAL_CAPSULE | Freq: Every day | ORAL | 3 refills | Status: DC

## 2021-07-29 MED ORDER — GABAPENTIN 600 MG PO TABS: 600.0000 mg | ORAL_TABLET | Freq: Two times a day (BID) | ORAL | 3 refills | Status: DC

## 2021-07-29 NOTE — Progress Notes (Signed)
BH MD/PA/NP OP Progress Note  Virtual Visit via Video Note  I connected with Briana Zhang on 07/29/21 at 11:30 AM EDT by a video enabled telemedicine application and verified that I am speaking with the correct person using two identifiers.  Location: Patient: Home Provider: Clinic   I discussed the limitations of evaluation and management by telemedicine and the availability of in person appointments. The patient expressed understanding and agreed to proceed.  I provided 30 minutes of non-face-to-face time during this encounter.   07/29/2021 12:06 PM Briana Zhang  MRN:  [id number]  Chief Complaint: "I fine"  HPI: 40 year old female seen today for follow up psychiatric evaluation. She is a former patient of Dr. Quintella Baton who is being Transferred to Clinical research associate for medication management. She has a psychiatric history of bipolar disorder, anxiety, and poly substance use (alcohol, narcotics, methadone, and cocaine). She is currently managed on Gabapentin 600 mg twice daily and Prozac 20 mg daily. She notes her medications are effective in managing her psychiatric conditions.  Today she is well groomed, pleasant, cooperative, engaged in conversation, and maintained eye contact. She notes that since her last visit she has been doing well. She notes that she has minimal anxiety and depression. Provider conducted a GAD 7 and patient scored a 0. Provider also conducted a PHQ 9 and patient scored a 1. She endorses adequate sleep and appetite. She denies SI/HI/VAH, mania, or paranoia.   No medication changes made today. Patient agreeable to continue medications as prescribed. No other concerns noted at this time.  Visit Diagnosis:    ICD-10-CM   1. Bipolar 1 disorder, mixed, full remission (HCC)  F31.78 FLUoxetine (PROZAC) 20 MG capsule    gabapentin (NEURONTIN) 600 MG tablet      Past Psychiatric History: Bipolar d/o, polysubstance dependence in remission  Past Medical History:  Past Medical  History:  Diagnosis Date   Bipolar 1 disorder (HCC)    Cellulitis and abscess    Paranoid schizophrenia (HCC)    Substance abuse (HCC)    IVDU mulitple substances    Past Surgical History:  Procedure Laterality Date   CESAREAN SECTION     CESAREAN SECTION N/A 06/18/2014   Procedure: CESAREAN SECTION;  Surgeon: Adam Phenix, MD;  Location: [facility];  Service: Obstetrics;  Laterality: N/A;   WISDOM TOOTH EXTRACTION      Family Psychiatric History: Mom- depression  Family History:  Family History  Problem Relation Age of Onset   Cancer Mother    Depression Mother    Diabetes Father    Cancer Maternal Grandmother    Cancer Maternal Grandfather     Social History:  Social History   Socioeconomic History   Marital status: Single    Spouse name: Not on file   Number of children: Not on file   Years of education: Not on file   Highest education level: Not on file  Occupational History   Occupation: Financial planner   Tobacco Use   Smoking status: Every Day    Packs/day: 0.50    Types: E-cigarettes, Cigarettes   Smokeless tobacco: Current  Vaping Use   Vaping Use: Every day  Substance and Sexual Activity   Alcohol use: No   Drug use: No    Types: Heroin    Comment: sober x 11 monrhs   Sexual activity: Yes    Birth control/protection: None  Other Topics Concern   Not on file  Social History Narrative  Not on file   Social Determinants of Health   Financial Resource Strain: Not on file  Food Insecurity: Not on file  Transportation Needs: Not on file  Physical Activity: Not on file  Stress: Not on file  Social Connections: Not on file    Allergies:  Allergies  Allergen Reactions   Ciprofloxacin Swelling and Rash    Metabolic Disorder Labs: No results found for: HGBA1C, MPG No results found for: PROLACTIN No results found for: CHOL, TRIG, HDL, CHOLHDL, VLDL, LDLCALC Lab Results  Component Value Date   TSH 0.739 05/07/2014    Therapeutic Level  Labs: No results found for: LITHIUM No results found for: VALPROATE No components found for:  CBMZ  Current Medications: Current Outpatient Medications  Medication Sig Dispense Refill   FLUoxetine (PROZAC) 20 MG capsule Take 1 capsule (20 mg total) by mouth daily. 30 capsule 3   gabapentin (NEURONTIN) 600 MG tablet Take 1 tablet (600 mg total) by mouth 2 (two) times daily. 60 tablet 3   No current facility-administered medications for this visit.     Musculoskeletal: Strength & Muscle Tone:  Unable to assess due to telehealth visit Gait & Station:  Unable to assess due to telehealth visit Patient leans: N/A  Psychiatric Specialty Exam: Review of Systems  There were no vitals taken for this visit.There is no height or weight on file to calculate BMI.  General Appearance: Well Groomed  Eye Contact:  Good  Speech:  Clear and Coherent and Normal Rate  Volume:  Normal  Mood:  Euthymic  Affect:  Appropriate and Congruent  Thought Process:  Coherent, Goal Directed, and Linear  Orientation:  Full (Time, Place, and Person)  Thought Content: WDL and Logical   Suicidal Thoughts:  No  Homicidal Thoughts:  No  Memory:  Immediate;   Good Recent;   Good Remote;   Good  Judgement:  Good  Insight:  Good  Psychomotor Activity:  Normal  Concentration:  Concentration: Good and Attention Span: Good  Recall:  Good  Fund of Knowledge: Good  Language: Good  Akathisia:  No  Handed:  Right  AIMS (if indicated): not done  Assets:  Communication Skills Desire for Improvement Financial Resources/Insurance Housing Physical Health Social Support  ADL's:  Intact  Cognition: WNL  Sleep:  Good   Screenings: GAD-7    Flowsheet Row Video Visit from 07/29/2021 in [facility]  Total GAD-7 Score 0      PHQ2-9    Flowsheet Row Video Visit from 07/29/2021 in [facility] Office Visit from 10/23/2018 in [facility] for  Infectious Disease  PHQ-2 Total Score 0 0        Assessment and Plan: Patient notes that she is doing well on her current medication regimen. No medication changes made today. Patient agreeable to continue medications as prescribed.  1. Bipolar 1 disorder, mixed, full remission (HCC)  Continue- FLUoxetine (PROZAC) 20 MG capsule; Take 1 capsule (20 mg total) by mouth daily.  Dispense: 30 capsule; Refill: 3 Continue- gabapentin (NEURONTIN) 600 MG tablet; Take 1 tablet (600 mg total) by mouth 2 (two) times daily.  Dispense: 60 tablet; Refill: 3  Follow up in 3 months  Shanna Cisco, NP 07/29/2021, 12:06 PM

## 2021-10-28 ENCOUNTER — Telehealth (INDEPENDENT_AMBULATORY_CARE_PROVIDER_SITE_OTHER): Payer: No Payment, Other | Admitting: Psychiatry

## 2021-10-28 ENCOUNTER — Encounter (HOSPITAL_COMMUNITY): Payer: Self-pay | Admitting: Psychiatry

## 2021-10-28 DIAGNOSIS — F3178 Bipolar disorder, in full remission, most recent episode mixed: Secondary | ICD-10-CM | POA: Diagnosis not present

## 2021-10-28 MED ORDER — FLUOXETINE HCL 20 MG PO CAPS: 20.0000 mg | ORAL_CAPSULE | Freq: Every day | ORAL | 3 refills | Status: DC

## 2021-10-28 MED ORDER — GABAPENTIN 600 MG PO TABS: 600.0000 mg | ORAL_TABLET | Freq: Two times a day (BID) | ORAL | 3 refills | Status: DC

## 2021-10-28 NOTE — Progress Notes (Signed)
BH MD/PA/NP OP Progress Note  Virtual Visit via Video Note  I connected with Briana Zhang on 07/29/21 at 11:30 AM EDT by a video enabled telemedicine application and verified that I am speaking with the correct person using two identifiers.  Location: Patient: Home Provider: Clinic   I discussed the limitations of evaluation and management by telemedicine and the availability of in person appointments. The patient expressed understanding and agreed to proceed.  I provided 30 minutes of non-face-to-face time during this encounter.   07/29/2021 12:06 PM KARLEY PHO  MRN:  [id number]  Chief Complaint: "I'm a little worried about my boyfriend and father"  HPI: 40 year old female seen today for follow up psychiatric evaluation.  She has a psychiatric history of bipolar disorder, anxiety, and poly substance use (alcohol, narcotics, methadone, and cocaine). She is currently managed on Gabapentin 600 mg twice daily and Prozac 20 mg daily. She notes her medications are effective in managing her psychiatric conditions.  Today she is well groomed, pleasant, cooperative, engaged in conversation, and maintained eye contact. She informed Clinical research associate that recently she has been worried about her father and her boyfriend. She notes that her boyfriend recently had open heart surgery and her father also is going to have surgery. She however reports that she is able to cope with her anxiety and informed writer that her depression is well managed. Today provider conducted a GAD 7 and patient scored a 4, at her last visit she scored a 0. Provider also conducted a PHQ 9 and patient scored a 0, at her last visit she scored a 1. She endorses adequate sleep and appetite. She denies SI/HI/VAH, mania, or paranoia.   Patient notes that she has been sober off of illegal substances for over 13 months and reports that she is feeling well.   No medication changes made today. Patient agreeable to continue medications as  prescribed. No other concerns noted at this time.  Visit Diagnosis:    ICD-10-CM   1. Bipolar 1 disorder, mixed, full remission (HCC)  F31.78 FLUoxetine (PROZAC) 20 MG capsule    gabapentin (NEURONTIN) 600 MG tablet      Past Psychiatric History: Bipolar d/o, polysubstance dependence in remission  Past Medical History:  Past Medical History:  Diagnosis Date   Bipolar 1 disorder (HCC)    Cellulitis and abscess    Paranoid schizophrenia (HCC)    Substance abuse (HCC)    IVDU mulitple substances    Past Surgical History:  Procedure Laterality Date   CESAREAN SECTION     CESAREAN SECTION N/A 06/18/2014   Procedure: CESAREAN SECTION;  Surgeon: Adam Phenix, MD;  Location: [facility];  Service: Obstetrics;  Laterality: N/A;   WISDOM TOOTH EXTRACTION      Family Psychiatric History: Mom- depression  Family History:  Family History  Problem Relation Age of Onset   Cancer Mother    Depression Mother    Diabetes Father    Cancer Maternal Grandmother    Cancer Maternal Grandfather     Social History:  Social History   Socioeconomic History   Marital status: Single    Spouse name: Not on file   Number of children: Not on file   Years of education: Not on file   Highest education level: Not on file  Occupational History   Occupation: Financial planner   Tobacco Use   Smoking status: Every Day    Packs/day: 0.50    Types: E-cigarettes, Cigarettes   Smokeless  tobacco: Current  Vaping Use   Vaping Use: Every day  Substance and Sexual Activity   Alcohol use: No   Drug use: No    Types: Heroin    Comment: sober x 11 monrhs   Sexual activity: Yes    Birth control/protection: None  Other Topics Concern   Not on file  Social History Narrative   Not on file   Social Determinants of Health   Financial Resource Strain: Not on file  Food Insecurity: Not on file  Transportation Needs: Not on file  Physical Activity: Not on file  Stress: Not on file  Social Connections:  Not on file    Allergies:  Allergies  Allergen Reactions   Ciprofloxacin Swelling and Rash    Metabolic Disorder Labs: No results found for: HGBA1C, MPG No results found for: PROLACTIN No results found for: CHOL, TRIG, HDL, CHOLHDL, VLDL, LDLCALC Lab Results  Component Value Date   TSH 0.739 05/07/2014    Therapeutic Level Labs: No results found for: LITHIUM No results found for: VALPROATE No components found for:  CBMZ  Current Medications: Current Outpatient Medications  Medication Sig Dispense Refill   FLUoxetine (PROZAC) 20 MG capsule Take 1 capsule (20 mg total) by mouth daily. 30 capsule 3   gabapentin (NEURONTIN) 600 MG tablet Take 1 tablet (600 mg total) by mouth 2 (two) times daily. 60 tablet 3   No current facility-administered medications for this visit.     Musculoskeletal: Strength & Muscle Tone:  Unable to assess due to telehealth visit Gait & Station:  Unable to assess due to telehealth visit Patient leans: N/A  Psychiatric Specialty Exam: Review of Systems  There were no vitals taken for this visit.There is no height or weight on file to calculate BMI.  General Appearance: Well Groomed  Eye Contact:  Good  Speech:  Clear and Coherent and Normal Rate  Volume:  Normal  Mood:  Euthymic  Affect:  Appropriate and Congruent  Thought Process:  Coherent, Goal Directed, and Linear  Orientation:  Full (Time, Place, and Person)  Thought Content: WDL and Logical   Suicidal Thoughts:  No  Homicidal Thoughts:  No  Memory:  Immediate;   Good Recent;   Good Remote;   Good  Judgement:  Good  Insight:  Good  Psychomotor Activity:  Normal  Concentration:  Concentration: Good and Attention Span: Good  Recall:  Good  Fund of Knowledge: Good  Language: Good  Akathisia:  No  Handed:  Right  AIMS (if indicated): not done  Assets:  Communication Skills Desire for Improvement Financial Resources/Insurance Housing Physical Health Social Support  ADL's:   Intact  Cognition: WNL  Sleep:  Good   Screenings: GAD-7    Flowsheet Row Video Visit from 07/29/2021 in [facility]  Total GAD-7 Score 0      PHQ2-9    Flowsheet Row Video Visit from 07/29/2021 in [facility] Office Visit from 10/23/2018 in [facility]  PHQ-2 Total Score 0 0        Assessment and Plan: Patient notes that she is a little anxious about her boyfriend recent surgery/recovery and her fathers upcoming surgery but notes that she is able to cope with it. No medication changes made today. Patient agreeable to continue medications as prescribed.   1. Bipolar 1 disorder, mixed, full remission (HCC)  Continue- FLUoxetine (PROZAC) 20 MG capsule; Take 1 capsule (20 mg total) by mouth  daily.  Dispense: 30 capsule; Refill: 3 Continue- gabapentin (NEURONTIN) 600 MG tablet; Take 1 tablet (600 mg total) by mouth 2 (two) times daily.  Dispense: 60 tablet; Refill: 3  Follow up in 3 months  Shanna Cisco, NP 07/29/2021, 12:06 PM

## 2022-01-02 ENCOUNTER — Other Ambulatory Visit: Payer: Self-pay

## 2022-01-02 DIAGNOSIS — N6320 Unspecified lump in the left breast, unspecified quadrant: Secondary | ICD-10-CM

## 2022-01-20 ENCOUNTER — Encounter (HOSPITAL_COMMUNITY): Payer: Self-pay | Admitting: Psychiatry

## 2022-01-20 ENCOUNTER — Telehealth (INDEPENDENT_AMBULATORY_CARE_PROVIDER_SITE_OTHER): Payer: No Payment, Other | Admitting: Psychiatry

## 2022-01-20 DIAGNOSIS — F3178 Bipolar disorder, in full remission, most recent episode mixed: Secondary | ICD-10-CM | POA: Diagnosis not present

## 2022-01-20 MED ORDER — GABAPENTIN 600 MG PO TABS: 600.0000 mg | ORAL_TABLET | Freq: Two times a day (BID) | ORAL | 3 refills | Status: DC

## 2022-01-20 MED ORDER — FLUOXETINE HCL 20 MG PO CAPS: 20.0000 mg | ORAL_CAPSULE | Freq: Every day | ORAL | 3 refills | Status: DC

## 2022-01-20 NOTE — Progress Notes (Signed)
BH MD/PA/NP OP Progress Note  Virtual Visit via Video Note  I connected with Briana Zhang on 01/20/22 at 11:30 AM EST by a video enabled telemedicine application and verified that I am speaking with the correct person using two identifiers.  Location: Patient: Home Provider: Clinic   I discussed the limitations of evaluation and management by telemedicine and the availability of in person appointments. The patient expressed understanding and agreed to proceed.  I provided 30 minutes of non-face-to-face time during this encounter.   01/20/2022 11:32 AM Briana Zhang  MRN:  [id number]  Chief Complaint: "I'm doing okay"  HPI: 41 year old female seen today for follow up psychiatric evaluation.  She has a psychiatric history of bipolar disorder, anxiety, and poly substance use (alcohol, narcotics, methadone, and cocaine). She is currently managed on Gabapentin 600 mg twice daily and Prozac 20 mg daily. She notes her medications are effective in managing her psychiatric conditions.  Today she is well groomed, pleasant, cooperative, engaged in conversation, and maintained eye contact. She informed Clinical research associate that she has been doing okay.  At last visit patient notes that she was concerned about her father and her boyfriend who both had surgery.  She now notes that the have recovered and are doing well.  She informed Clinical research associate that her mood is stable and reports that she has minimal anxiety and depression.  Provider conducted a GAD-7 and patient scored a 0, at her last visit she scored a 4.  Provider also conducted a PHQ-9 of the scored a 1, at her last visit she scored a 0.  She endorsed adequate sleep and appetite.    Patient continues to maintain her sobriety.   No medication changes made today. Patient agreeable to continue medications as prescribed. No other concerns noted at this time.  Visit Diagnosis:    ICD-10-CM   1. Bipolar 1 disorder, mixed, full remission (HCC)  F31.78 FLUoxetine  (PROZAC) 20 MG capsule    gabapentin (NEURONTIN) 600 MG tablet      Past Psychiatric History: Bipolar d/o, polysubstance dependence in remission  Past Medical History:  Past Medical History:  Diagnosis Date   Bipolar 1 disorder (HCC)    Cellulitis and abscess    Paranoid schizophrenia (HCC)    Substance abuse (HCC)    IVDU mulitple substances    Past Surgical History:  Procedure Laterality Date   CESAREAN SECTION     CESAREAN SECTION N/A 06/18/2014   Procedure: CESAREAN SECTION;  Surgeon: Adam Phenix, MD;  Location: [facility];  Service: Obstetrics;  Laterality: N/A;   WISDOM TOOTH EXTRACTION      Family Psychiatric History: Mom- depression  Family History:  Family History  Problem Relation Age of Onset   Cancer Mother    Depression Mother    Diabetes Father    Cancer Maternal Grandmother    Cancer Maternal Grandfather     Social History:  Social History   Socioeconomic History   Marital status: Single    Spouse name: Not on file   Number of children: Not on file   Years of education: Not on file   Highest education level: Not on file  Occupational History   Occupation: Financial planner   Tobacco Use   Smoking status: Every Day    Packs/day: 0.50    Types: E-cigarettes, Cigarettes   Smokeless tobacco: Current  Vaping Use   Vaping Use: Every day  Substance and Sexual Activity   Alcohol use: No  Drug use: No    Types: Heroin    Comment: sober x 11 monrhs   Sexual activity: Yes    Birth control/protection: None  Other Topics Concern   Not on file  Social History Narrative   Not on file   Social Determinants of Health   Financial Resource Strain: Not on file  Food Insecurity: Not on file  Transportation Needs: Not on file  Physical Activity: Not on file  Stress: Not on file  Social Connections: Not on file    Allergies:  Allergies  Allergen Reactions   Ciprofloxacin Swelling and Rash    Metabolic Disorder Labs: No results found for: HGBA1C,  MPG No results found for: PROLACTIN No results found for: CHOL, TRIG, HDL, CHOLHDL, VLDL, LDLCALC Lab Results  Component Value Date   TSH 0.739 05/07/2014    Therapeutic Level Labs: No results found for: LITHIUM No results found for: VALPROATE No components found for:  CBMZ  Current Medications: Current Outpatient Medications  Medication Sig Dispense Refill   FLUoxetine (PROZAC) 20 MG capsule Take 1 capsule (20 mg total) by mouth daily. 30 capsule 3   gabapentin (NEURONTIN) 600 MG tablet Take 1 tablet (600 mg total) by mouth 2 (two) times daily. 60 tablet 3   No current facility-administered medications for this visit.     Musculoskeletal: Strength & Muscle Tone:  Unable to assess due to telehealth visit [place]:  Unable to assess due to telehealth visit Patient leans: N/A  Psychiatric Specialty Exam: Review of Systems  There were no vitals taken for this visit.There is no height or weight on file to calculate BMI.  General Appearance: Well Groomed  Eye Contact:  Good  Speech:  Clear and Coherent and Normal Rate  Volume:  Normal  Mood:  Euthymic  Affect:  Appropriate and Congruent  Thought Process:  Coherent, Goal Directed, and Linear  Orientation:  Full (Time, Place, and Person)  Thought Content: WDL and Logical   Suicidal Thoughts:  No  Homicidal Thoughts:  No  Memory:  Immediate;   Good Recent;   Good Remote;   Good  Judgement:  Good  Insight:  Good  Psychomotor Activity:  Normal  Concentration:  Concentration: Good and Attention Span: Good  Recall:  Good  Fund of Knowledge: Good  Language: Good  Akathisia:  No  Handed:  Right  AIMS (if indicated): not done  Assets:  Communication Skills Desire for Improvement Financial Resources/Insurance Housing Physical Health Social Support  ADL's:  Intact  Cognition: WNL  Sleep:  Good   Screenings: GAD-7    Flowsheet Row Video Visit from 01/20/2022 in [facility] Video  Visit from 10/28/2021 in [facility] Video Visit from 07/29/2021 in [facility]  Total GAD-7 Score 0 4 0      PHQ2-9    Flowsheet Row Video Visit from 01/20/2022 in [facility] Video Visit from 10/28/2021 in [facility] Video Visit from 07/29/2021 in [facility] Office Visit from 10/23/2018 in [facility]  PHQ-2 Total Score 0 0 0 0  PHQ-9 Total Score 1 0 -- --        Assessment and Plan: Patient notes that she is doing well on her current medication regimen.  No medication changes made today. Patient agreeable to continue medications as prescribed.   1. Bipolar 1 disorder, mixed, full remission ([place])  Continue- FLUoxetine (PROZAC) 20 MG capsule; Take 1 capsule (20 mg total) by mouth daily.  Dispense: 30 capsule; Refill: 3 Continue- gabapentin (NEURONTIN) 600 MG tablet; Take 1 tablet (600 mg total) by mouth 2 (two) times daily.  Dispense: 60 tablet; Refill: 3  Follow up in 3 months  Salley Slaughter, NP 01/20/2022, 11:32 AM

## 2022-01-26 ENCOUNTER — Ambulatory Visit
Admission: RE | Admit: 2022-01-26 | Discharge: 2022-01-26 | Disposition: A | Discharge status: Home / Self Care | Payer: Medicaid Other | Source: Ambulatory Visit | Attending: Obstetrics and Gynecology | Admitting: Obstetrics and Gynecology

## 2022-01-26 ENCOUNTER — Other Ambulatory Visit: Payer: Self-pay | Admitting: Obstetrics and Gynecology

## 2022-01-26 ENCOUNTER — Ambulatory Visit
Admission: RE | Admit: 2022-01-26 | Discharge: 2022-01-26 | Disposition: A | Discharge status: Home / Self Care | Payer: No Typology Code available for payment source | Source: Ambulatory Visit | Attending: Obstetrics and Gynecology | Admitting: Obstetrics and Gynecology

## 2022-01-26 ENCOUNTER — Ambulatory Visit: Payer: Self-pay | Admitting: *Deleted

## 2022-01-26 ENCOUNTER — Other Ambulatory Visit: Payer: Self-pay

## 2022-01-26 VITALS — BP 102/70 | Wt 127.2 lb

## 2022-01-26 DIAGNOSIS — N6310 Unspecified lump in the right breast, unspecified quadrant: Secondary | ICD-10-CM

## 2022-01-26 DIAGNOSIS — N6320 Unspecified lump in the left breast, unspecified quadrant: Secondary | ICD-10-CM

## 2022-01-26 DIAGNOSIS — N644 Mastodynia: Secondary | ICD-10-CM

## 2022-01-26 DIAGNOSIS — N6321 Unspecified lump in the left breast, upper outer quadrant: Secondary | ICD-10-CM

## 2022-01-26 DIAGNOSIS — Z1239 Encounter for other screening for malignant neoplasm of breast: Secondary | ICD-10-CM

## 2022-01-26 NOTE — Patient Instructions (Signed)
Explained breast self awareness with Rennis Petty. Patient did not need a Pap smear today due to last Pap smear and HPV typing was 07/26/2021. Let her know BCCCP will cover Pap smears and HPV typing every 5 years unless has a history of abnormal Pap smears. Referred patient to the [facility] for a diagnostic mammogram. Appointment scheduled Thursday, January 26, 2022 at 1010. Patient aware of appointment and will be there. Rennis Petty verbalized understanding.  Melainie Krinsky, Arvil Chaco, RN 9:02 AM

## 2022-01-26 NOTE — Progress Notes (Signed)
[name] is a 41 y.o. female who presents to [facility] clinic today with complaint of bilateral breast lumps and pain. Patient stated she has bilateral diffuse breast pain around once a month. Patient states she currently has the Mirena and doesn't have periods, therefore unsure if correlates with time of menstrual period. Patient rates the pain a 3-4 out of 10 when occurs. Patient states she is currently having the pain and swelling x one day.    Pap Smear: Pap smear not completed today. Last Pap smear was 07/26/2021 at the [facility] clinic and was normal with negative HPV. Per patient thinks had an abnormal Pap smear or Pap smear positive for HPV around 2 years ago. Patient denied having a colposcopy or biopsy completed for follow up.  Last Pap smear result is available in Epic.   Physical exam: Breasts Right breast is slightly larger than left breast. No skin abnormalities bilateral breasts. No nipple retraction bilateral breasts. No nipple discharge bilateral breasts. No lymphadenopathy. No lumps palpated bilateral breasts. Complaints of right nipple area and outer breast pain on exam. Complaints of left nipple area and inner breast pain on exam.      Pelvic/Bimanual Pap is not indicated today per BCCCP guidelines.   Smoking History: Patient is a current smoker that vapes. Discussed smoking cessation with patient. Referred to the [facility] Quitline.   Patient Navigation: Patient education provided. Access to services provided for patient through BCCCP program.    Breast and Cervical Cancer Risk Assessment: Patient thinks she has a family history of her mother either having cervical or breast cancer. Patient has no known genetic mutations or history of radiation treatment to the chest before age 83. Patient does not have history of cervical dysplasia, immunocompromised, or DES exposure in-utero.  Risk Assessment     Risk Scores       01/26/2022   Last edited by:  Narda Rutherford, LPN   5-year risk: 0.8 %   Lifetime risk: 18 %            A: BCCCP exam without pap smear Complaint of bilateral breast lumps and pain.  P: Referred patient to [facility] for a diagnostic mammogram. Appointment scheduled Thursday, January 26, 2022 at 1010.  Priscille Heidelberg, RN 01/26/2022 9:02 AM

## 2022-03-31 ENCOUNTER — Telehealth (HOSPITAL_COMMUNITY): Payer: No Payment, Other | Admitting: Psychiatry

## 2022-04-06 ENCOUNTER — Telehealth (HOSPITAL_COMMUNITY): Payer: BLUE CROSS/BLUE SHIELD | Admitting: Psychiatry

## 2022-04-06 ENCOUNTER — Encounter (HOSPITAL_COMMUNITY): Payer: Self-pay

## 2022-04-26 ENCOUNTER — Telehealth (INDEPENDENT_AMBULATORY_CARE_PROVIDER_SITE_OTHER): Payer: No Payment, Other | Admitting: Psychiatry

## 2022-04-26 DIAGNOSIS — F3178 Bipolar disorder, in full remission, most recent episode mixed: Secondary | ICD-10-CM

## 2022-04-26 MED ORDER — GABAPENTIN 600 MG PO TABS: 600.0000 mg | ORAL_TABLET | Freq: Two times a day (BID) | ORAL | 0 refills | Status: DC

## 2022-04-26 MED ORDER — FLUOXETINE HCL 20 MG PO CAPS: 20.0000 mg | ORAL_CAPSULE | Freq: Every day | ORAL | 0 refills | Status: DC

## 2022-04-26 NOTE — Progress Notes (Signed)
BH MD/PA/NP OP Progress Note ? ?04/26/2022 6:34 PM ?Briana Zhang  ?MRN:  [id number] ? ?Virtual Visit via Video Note ? ?I connected with Briana Zhang on 04/26/22 at  3:30 PM EDT by a video enabled telemedicine application and verified that I am speaking with the correct person using two identifiers. ? ?Location: ?Patient: home ?Provider: offsite ?  ?I discussed the limitations of evaluation and management by telemedicine and the availability of in person appointments. The patient expressed understanding and agreed to proceed. ? ? ? ?  ?I discussed the assessment and treatment plan with the patient. The patient was provided an opportunity to ask questions and all were answered. The patient agreed with the plan and demonstrated an understanding of the instructions. ?  ?The patient was advised to call back or seek an in-person evaluation if the symptoms worsen or if the condition fails to improve as anticipated. ? ?I provided 10 minutes of non-face-to-face time during this encounter. ? ? ?Franne Grip, NP  ? ?Chief Complaint: Medication management ? ?HPI: Briana Zhang is a 41 year old female presenting to [facility] behavioral health outpatient for follow-up psychiatric evaluation.  She has a psychiatric history of alcohol dependence, generalized anxiety disorder, bipolar disorder and polysubstance use.  Patient symptoms are managed with Prozac 20 mg daily and Neurontin 600 mg twice daily.  Patient reports that medications are effective with treating her symptoms and reports medication compliance.  Patient denies adverse medication effects or need for dosage adjustment today.  No medication changes today. ? ? ?Visit Diagnosis:  ?  ICD-10-CM   ?1. Bipolar 1 disorder, mixed, full remission (HCC)  F31.78 FLUoxetine (PROZAC) 20 MG capsule  ?  gabapentin (NEURONTIN) 600 MG tablet  ?  ? ? ?Past Psychiatric History:  ? ?Past Medical History:  ?Past Medical History:  ?Diagnosis Date  ? Bipolar 1 disorder ([place])   ?  Cellulitis and abscess   ? Paranoid schizophrenia ([place])   ? Substance abuse ([place])   ? IVDU mulitple substances  ?  ?Past Surgical History:  ?Procedure Laterality Date  ? CESAREAN SECTION    ? CESAREAN SECTION N/A 06/18/2014  ? Procedure: CESAREAN SECTION;  Surgeon: Woodroe Mode, MD;  Location: [facility];  Service: Obstetrics;  Laterality: N/A;  ? WISDOM TOOTH EXTRACTION    ? ? ?Family Psychiatric History:  ? ?Family History:  ?Family History  ?Problem Relation Age of Onset  ? Cancer Mother   ? Depression Mother   ? Diabetes Father   ? Cancer Maternal Grandmother   ? Cancer Maternal Grandfather   ? ? ?Social History:  ?Social History  ? ?Socioeconomic History  ? Marital status: Single  ?  Spouse name: Not on file  ? Number of children: 3  ? Years of education: Not on file  ? Highest education level: GED or equivalent  ?Occupational History  ? Occupation: Therapist, nutritional   ?Tobacco Use  ? Smoking status: Every Day  ?  Packs/day: 0.50  ?  Types: E-cigarettes, Cigarettes  ? Smokeless tobacco: Current  ?Vaping Use  ? Vaping Use: Every day  ?Substance and Sexual Activity  ? Alcohol use: No  ? Drug use: No  ?  Types: Heroin  ?  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 Sexual activity: Yes  ?  Birth control/protection: I.U.D.  ?Other Topics Concern  ? Not on file  ?Social History Narrative  ? Not on file  ? ?Social Determinants of Health  ? ?Financial Resource Strain:  Not on file  ?Food Insecurity: No Food Insecurity  ? Worried About Charity fundraiser in the Last Year: Never true  ? Ran Out of Food in the Last Year: Never true  ?Transportation Needs: No Transportation Needs  ? Lack of Transportation (Medical): No  ? Lack of Transportation (Non-Medical): No  ?Physical Activity: Not on file  ?Stress: Not on file  ?Social Connections: Not on file  ? ? ?Allergies:  ?Allergies  ?Allergen Reactions  ? Ciprofloxacin Swelling and Rash  ? ? ?Metabolic Disorder Labs: ?No results found for: HGBA1C, MPG ?No results found for: PROLACTIN ?No  results found for: CHOL, TRIG, HDL, CHOLHDL, VLDL, LDLCALC ?Lab Results  ?Component Value Date  ? TSH 0.739 05/07/2014  ? ? ?Therapeutic Level Labs: ?No results found for: LITHIUM ?No results found for: VALPROATE ?No components found for:  CBMZ ? ?Current Medications: ?Current Outpatient Medications  ?Medication Sig Dispense Refill  ? FLUoxetine (PROZAC) 20 MG capsule Take 1 capsule (20 mg total) by mouth daily. 30 capsule 0  ? gabapentin (NEURONTIN) 600 MG tablet Take 1 tablet (600 mg total) by mouth 2 (two) times daily. 60 tablet 0  ? valACYclovir (VALTREX) 1000 MG tablet Take 1,000 mg by mouth 2 (two) times daily.    ? ?No current facility-administered medications for this visit.  ? ? ? ?Musculoskeletal: ?Strength & Muscle Tone: N/A virtual visit ?Gait & Station: N/A virtual visit ?Patient leans: N/A ? ?Psychiatric Specialty Exam: ?Review of Systems  ?Psychiatric/Behavioral:  Negative for hallucinations and suicidal ideas. The patient is not nervous/anxious.   ?All other systems reviewed and are negative.  ?There were no vitals taken for this visit.There is no height or weight on file to calculate BMI.  ?General Appearance: Well-groomed  ?Eye Contact: Good  ?Speech: Clear and coherent  ?Volume: Normal  ?Mood: Euthymic  ?Affect: Congruent  ?Thought Process: Goal directed  ?Orientation:  Full (Time, Place, and Person)  ?Thought Content: Logical   ?Suicidal Thoughts:  No  ?Homicidal Thoughts:  No  ?Memory: Good   ?Judgement: Good  ?Insight: Good  ?Psychomotor Activity: N/A  ?Concentration: Good  ?Recall: Good  ?Fund of Knowledge: Good  ?Language: Good  ?Akathisia: N/A  ?Handed: Right  ?AIMS (if indicated): not done  ?Assets:  Communication Skills ?Desire for Improvement  ?ADL's:  Intact  ?Cognition: WNL  ?Sleep:  Good  ? ?Screenings: ?GAD-7   ? ?Flowsheet Row Video Visit from 01/20/2022 in [facility] Video Visit from 10/28/2021 in [facility] Video Visit from  07/29/2021 in [facility]  ?Total GAD-7 Score 0 4 0  ? ?  ? ?PHQ2-9   ? ?Flowsheet Row Video Visit from 01/20/2022 in [facility] Video Visit from 10/28/2021 in [facility] Video Visit from 07/29/2021 in [facility] Office Visit from 10/23/2018 in [facility]  ?PHQ-2 Total Score 0 0 0 0  ?PHQ-9 Total Score 1 0 -- --  ? ?  ? ? ? ?Assessment and Plan:  Briana Zhang is a 41 year old female presenting to [facility] behavioral health outpatient for follow-up psychiatric evaluation.  She has a psychiatric history of alcohol dependence, generalized anxiety disorder, bipolar disorder and polysubstance use.  Patient symptoms are managed with Prozac 20 mg daily and Neurontin 600 mg twice daily.  Patient reports that medications are effective with treating her symptoms and reports medication compliance.  Patient  denies adverse medication effects or need for dosage adjustment today.  No medication changes today.  Medications refilled at current dosages. ? ?Collaboration of Care: Collaboration of Care: Medication Management AEB medications E scribed to patient's preferred pharmacy. ? ? ?1. Bipolar 1 disorder, mixed, full remission ([place]) ? ?- FLUoxetine (PROZAC) 20 MG capsule; Take 1 capsule (20 mg total) by mouth daily.  Dispense: 30 capsule; Refill: 0 ?- gabapentin (NEURONTIN) 600 MG tablet; Take 1 tablet (600 mg total) by mouth 2 (two) times daily.  Dispense: 60 tablet; Refill: 0  ? ?Return to care in 3 weeks ? ?Patient/Guardian was advised Release of Information must be obtained prior to any record release in order to collaborate their care with an outside provider. Patient/Guardian was advised if they have not already done so to contact the registration department to sign all necessary forms in order for [place] to release information regarding their care.  ? ?Consent:  Patient/Guardian gives verbal consent for treatment and assignment of benefits for services provided during this visit. Patient/Guardian expressed understanding and agreed to proceed.  ? ? ?Franne Grip, NP ?04/26/2022, 6:3

## 2022-07-12 ENCOUNTER — Telehealth (HOSPITAL_COMMUNITY): Payer: Self-pay | Admitting: *Deleted

## 2022-07-12 ENCOUNTER — Other Ambulatory Visit (HOSPITAL_COMMUNITY): Payer: Self-pay | Admitting: Psychiatry

## 2022-07-12 DIAGNOSIS — F3178 Bipolar disorder, in full remission, most recent episode mixed: Secondary | ICD-10-CM

## 2022-07-12 MED ORDER — FLUOXETINE HCL 20 MG PO CAPS: 20.0000 mg | ORAL_CAPSULE | Freq: Every day | ORAL | 3 refills | Status: DC

## 2022-07-12 MED ORDER — GABAPENTIN 600 MG PO TABS: 600.0000 mg | ORAL_TABLET | Freq: Two times a day (BID) | ORAL | 3 refills | Status: DC

## 2022-07-12 NOTE — Telephone Encounter (Signed)
PATIENT OF [facility] SENDING REFILL REQUEST to Dr B PARSONS  FLUoxetine (PROZAC) 20 MG capsule Take 1 capsule (20 mg total) by mouth daily   gabapentin (NEURONTIN) 600 MG tablet Take 1 tablet (600 mg total) by mouth 2 (two) times daily

## 2022-07-12 NOTE — Telephone Encounter (Signed)
Medication refilled and sent to preferred pharmacy.  Please have patient call clinic and reschedule appointment.

## 2022-07-26 ENCOUNTER — Telehealth (HOSPITAL_COMMUNITY): Payer: No Payment, Other | Admitting: Psychiatry

## 2022-07-28 ENCOUNTER — Telehealth (INDEPENDENT_AMBULATORY_CARE_PROVIDER_SITE_OTHER): Payer: No Payment, Other | Admitting: Psychiatry

## 2022-07-28 ENCOUNTER — Encounter (HOSPITAL_COMMUNITY): Payer: Self-pay | Admitting: Psychiatry

## 2022-07-28 DIAGNOSIS — F3178 Bipolar disorder, in full remission, most recent episode mixed: Secondary | ICD-10-CM | POA: Diagnosis not present

## 2022-07-28 MED ORDER — FLUOXETINE HCL 20 MG PO CAPS: 20.0000 mg | ORAL_CAPSULE | Freq: Every day | ORAL | 3 refills | Status: DC

## 2022-07-28 MED ORDER — GABAPENTIN 600 MG PO TABS: 600.0000 mg | ORAL_TABLET | Freq: Two times a day (BID) | ORAL | 3 refills | Status: DC

## 2022-07-28 NOTE — Progress Notes (Signed)
BH MD/PA/NP OP Progress Note  Virtual Visit via Video Note  I connected with Briana Zhang on 07/28/22 at  9:00 AM EDT by a video enabled telemedicine application and verified that I am speaking with the correct person using two identifiers.  Location: Patient: Home Provider: Clinic   I discussed the limitations of evaluation and management by telemedicine and the availability of in person appointments. The patient expressed understanding and agreed to proceed.  I provided 30 minutes of non-face-to-face time during this encounter.   07/28/2022 9:12 AM Briana Zhang  MRN:  [id number]  Chief Complaint: "Everything is good"  HPI: 41 year old female seen today for follow up psychiatric evaluation.  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 She is currently managed on Gabapentin 600 mg twice daily and Prozac 20 mg daily. She notes her medications are effective in managing her psychiatric conditions.  Today she is well groomed, pleasant, cooperative, engaged in conversation, and maintained eye contact. She informed Clinical research associate that everything is going good.  She notes that she will be returning to school in a few weeks.  The patient is getting an associates in arts.  Patient informed writer that her mood is stable and notes that she has very minimal anxiety and depression.  Provider conducted a GAD-7 and patient scored a 0.  Provider also conducted PHQ-9 and patient scored a 1.  She endorses adequate sleep and appetite.  Today she denies SI/HI/AVH, mania, paranoia.    No medication changes made today. Patient agreeable to continue medications as prescribed. No other concerns noted at this time.  Visit Diagnosis:    ICD-10-CM   1. Bipolar 1 disorder, mixed, full remission (HCC)  F31.78 FLUoxetine (PROZAC) 20 MG capsule    gabapentin (NEURONTIN) 600 MG tablet      Past Psychiatric  History: Bipolar d/o, polysubstance dependence in remission  Past Medical History:  Past Medical History:  Diagnosis Date   Bipolar 1 disorder (HCC)    Cellulitis and abscess    Paranoid schizophrenia (HCC)    Substance abuse (HCC)    IVDU mulitple substances    Past Surgical History:  Procedure Laterality Date   CESAREAN SECTION     CESAREAN SECTION N/A 06/18/2014   Procedure: CESAREAN SECTION;  Surgeon: Adam Phenix, MD;  Location: [facility];  Service: Obstetrics;  Laterality: N/A;   WISDOM TOOTH EXTRACTION      Family Psychiatric History: Mom- depression  Family History:  Family History  Problem Relation Age of Onset   Cancer Mother    Depression Mother    Diabetes Father    Cancer Maternal Grandmother    Cancer Maternal Grandfather     Social History:  Social History   Socioeconomic History   Marital status: Single    Spouse name: Not on file   Number of children: 3   Years of education: Not on file   Highest education level: GED or equivalent  Occupational History   Occupation: Financial planner   Tobacco Use   Smoking status: Every Day    Packs/day: 0.50    Types: E-cigarettes, Cigarettes   Smokeless tobacco: Current  Vaping Use   Vaping Use: Every day  Substance and Sexual Activity   Alcohol use: No   Drug use: No    Types: Heroin    Comment: sober x 11 monrhs   Sexual activity: Yes  Birth control/protection: I.U.D.  Other Topics Concern   Not on file  Social History Narrative   Not on file   Social Determinants of Health   Financial Resource Strain: Not on file  Food Insecurity: No Food Insecurity (01/26/2022)   Hunger Vital Sign    Worried About Running Out of Food in the Last Year: Never true    Ran Out of Food in the Last Year: Never true  Transportation Needs: No Transportation Needs (01/26/2022)   PRAPARE - Administrator, Civil Service (Medical): No    Lack of Transportation (Non-Medical): No  Physical Activity: Not on file   Stress: Not on file  Social Connections: Not on file    Allergies:  Allergies  Allergen Reactions   Ciprofloxacin Swelling and Rash    Metabolic Disorder Labs: No results found for: "HGBA1C", "MPG" No results found for: "PROLACTIN" No results found for: "CHOL", "TRIG", "HDL", "CHOLHDL", "VLDL", "LDLCALC" Lab Results  Component Value Date   TSH 0.739 05/07/2014    Therapeutic Level Labs: No results found for: "LITHIUM" No results found for: "VALPROATE" No results found for: "CBMZ"  Current Medications: Current Outpatient Medications  Medication Sig Dispense Refill   FLUoxetine (PROZAC) 20 MG capsule Take 1 capsule (20 mg total) by mouth daily. 30 capsule 3   gabapentin (NEURONTIN) 600 MG tablet Take 1 tablet (600 mg total) by mouth 2 (two) times daily. 60 tablet 3   valACYclovir (VALTREX) 1000 MG tablet Take 1,000 mg by mouth 2 (two) times daily.     No current facility-administered medications for this visit.     Musculoskeletal: Strength & Muscle Tone: within normal limits and  telehealth visit Gait & Station: normal, telehealth visit Patient leans: N/A  Psychiatric Specialty Exam: Review of Systems  There were no vitals taken for this visit.There is no height or weight on file to calculate BMI.  General Appearance: Well Groomed  Eye Contact:  Good  Speech:  Clear and Coherent and Normal Rate  Volume:  Normal  Mood:  Euthymic  Affect:  Appropriate and Congruent  Thought Process:  Coherent, Goal Directed, and Linear  Orientation:  Full (Time, Place, and Person)  Thought Content: WDL and Logical   Suicidal Thoughts:  No  Homicidal Thoughts:  No  Memory:  Immediate;   Good Recent;   Good Remote;   Good  Judgement:  Good  Insight:  Good  Psychomotor Activity:  Normal  Concentration:  Concentration: Good and Attention Span: Good  Recall:  Good  Fund of Knowledge: Good  Language: Good  Akathisia:  No  Handed:  Right  AIMS (if indicated): not done   Assets:  Communication Skills Desire for Improvement Financial Resources/Insurance Housing Physical Health Social Support  ADL's:  Intact  Cognition: WNL  Sleep:  Good   Screenings: GAD-7    Flowsheet Row Video Visit from 07/28/2022 in [facility] Video Visit from 01/20/2022 in [facility] Video Visit from 10/28/2021 in [facility] Video Visit from 07/29/2021 in [facility]  Total GAD-7 Score 0 0 4 0      PHQ2-9    Flowsheet Row Video Visit from 07/28/2022 in [facility] Video Visit from 01/20/2022 in [facility] Video Visit from 10/28/2021 in [facility] Video Visit from 07/29/2021 in [facility] Office Visit from 10/23/2018 in [facility]  PHQ-2 Total Score 0 0 0 0 0  PHQ-9 Total Score 1 1 0 -- --        Assessment and Plan: Patient notes that she is doing well on her current medication regimen.  No medication changes made today. Patient agreeable to continue medications as prescribed.   1. Bipolar 1 disorder, mixed, full remission (HCC)  Continue- FLUoxetine (PROZAC) 20 MG capsule; Take 1 capsule (20 mg total) by mouth daily.  Dispense: 30 capsule; Refill: 3 Continue- gabapentin (NEURONTIN) 600 MG tablet; Take 1 tablet (600 mg total) by mouth 2 (two) times daily.  Dispense: 60 tablet; Refill: 3  Follow up in 3 months  Shanna Cisco, NP 07/28/2022, 9:12 AM

## 2022-07-31 ENCOUNTER — Other Ambulatory Visit: Payer: Self-pay | Admitting: Obstetrics and Gynecology

## 2022-07-31 ENCOUNTER — Ambulatory Visit
Admission: RE | Admit: 2022-07-31 | Discharge: 2022-07-31 | Disposition: A | Discharge status: Home / Self Care | Payer: BLUE CROSS/BLUE SHIELD | Source: Ambulatory Visit | Attending: Obstetrics and Gynecology | Admitting: Obstetrics and Gynecology

## 2022-07-31 DIAGNOSIS — N6320 Unspecified lump in the left breast, unspecified quadrant: Secondary | ICD-10-CM

## 2022-10-12 ENCOUNTER — Encounter (HOSPITAL_COMMUNITY): Payer: Self-pay

## 2022-10-13 ENCOUNTER — Telehealth (HOSPITAL_COMMUNITY): Payer: Self-pay | Admitting: Psychiatry

## 2022-10-13 ENCOUNTER — Encounter (HOSPITAL_COMMUNITY): Payer: Self-pay

## 2022-10-15 NOTE — Progress Notes (Unsigned)
[place] MD Outpatient Progress Note  10/16/2022 7:39 PM Briana Zhang  MRN:  [id number]  Assessment:  Briana Zhang presents for follow-up evaluation. Today, 10/16/22, patient reports continued stability on below medication regimen. Has maintained sobriety from substances and continues to engage in recovery resources. Discussed diagnostic formulation - patient denies history of hypomania/mania outside of periods of active substance use and given stability on SSRI, it is felt more likely that previous symptoms attributed to bipolar were better explained by substance use. No changes to medications at this time. Plan to RTC in 3 months.   Identifying Information: Briana Zhang is a 41 y.o. female with historical diagnosis of bipolar 1 disorder vs. SIMD, alcohol and polysubstance use in remission, and hepatitis C s/p treatment with undetectable RNA who is an established patient with [place] participating in follow-up via video conferencing.   Plan:  # Historical diagnosis of bipolar 1 disorder, consider substance induced mood disorder  Anxiety Past medication trials: unknown Status of problem: stable Interventions: -- Continue Prozac 20 mg daily -- Continue Gabapentin 600 mg BID  # Alcohol use disorder in sustained remission  Opioid use disorder in sustained remission  Cocaine use disorder in sustained remission Status of problem: sustained remission Interventions: -- Continue to support patient in maintaining sobriety -- Patient regularly attends NA meetings  Patient was given contact information for behavioral health clinic and was instructed to call 911 for emergencies.   Subjective:  Chief Complaint:  Chief Complaint  Patient presents with   Medication Management    Interval History:   Last seen by Eulis Canner, NP 07/28/22. At that time, managed on: Prozac 20 mg daily, Gabapentin 600 mg BID and reporting overall stability. No changes made at that  time.   Today, patient reports she has been doing well and denies feeling down or depressed; denies symptoms of mania or hypomania. Denies anxiety. Sleep and appetite have been "normal."   States she was first started on Prozac and gabapentin when using substances. Denies any use of substances since 08/24/2020 - had been using heroin and crack among other substances at the time. Denies urges or cravings to return to use. Continues to participate in NA meetings. Feels current regimen works well for her although identifies substance use was likely playing a large role in mood symptoms at the time. Reports she was first diagnosed with bipolar disorder during period of substance use and denies history of mania/hypomania outside of substance use. Discussed diagnostic conceptualization of substance induced mood symptoms vs. Bipolar disorder. Denies adverse effects to current regimen and prefers to not make any changes given overall stability.   Visit Diagnosis:    ICD-10-CM   1. Bipolar 1 disorder, mixed, full remission (HCC)  F31.78 FLUoxetine (PROZAC) 20 MG capsule    gabapentin (NEURONTIN) 600 MG tablet      Past Psychiatric History:  Diagnoses: Historical diagnosis of bipolar 1 disorder vs. SIMD, polysubstance use (alcohol, opioids, methadone, cocaine) now in remission Substance use:   -- Denies current use of alcohol, tobacco, or illicit drugs  -- Last use of any substances (including crack, heroin) 08/24/2020  Past Medical History:  Past Medical History:  Diagnosis Date   Bipolar 1 disorder ([place])    Cellulitis and abscess    Substance abuse ([place])    IVDU mulitple substances    Past Surgical History:  Procedure Laterality Date   CESAREAN SECTION     CESAREAN SECTION N/A 06/18/2014   Procedure: CESAREAN  SECTION;  Surgeon: Woodroe Mode, MD;  Location: [facility];  Service: Obstetrics;  Laterality: N/A;   WISDOM TOOTH EXTRACTION      Family Psychiatric History:  Mother:  depression  Family History:  Family History  Problem Relation Age of Onset   Cancer Mother    Depression Mother    Diabetes Father    Cancer Maternal Grandmother    Cancer Maternal Grandfather     Social History:  Social History   Socioeconomic History   Marital status: Single    Spouse name: Not on file   Number of children: 3   Years of education: Not on file   Highest education level: GED or equivalent  Occupational History   Occupation: Therapist, nutritional   Tobacco Use   Smoking status: Former    Types: E-cigarettes, Cigarettes   Smokeless tobacco: Current  Vaping Use   Vaping Use: Every day  Substance and Sexual Activity   Alcohol use: No   Drug use: Not Currently    Comment: Last use of substances (heroin, cocaine, others) 08/24/2020   Sexual activity: Yes    Birth control/protection: I.U.D.  Other Topics Concern   Not on file  Social History Narrative   Not on file   Social Determinants of Health   Financial Resource Strain: Not on file  Food Insecurity: No Food Insecurity (01/26/2022)   Hunger Vital Sign    Worried About Running Out of Food in the Last Year: Never true    Ran Out of Food in the Last Year: Never true  Transportation Needs: No Transportation Needs (01/26/2022)   PRAPARE - Hydrologist (Medical): No    Lack of Transportation (Non-Medical): No  Physical Activity: Not on file  Stress: Not on file  Social Connections: Not on file    Allergies:  Allergies  Allergen Reactions   Ciprofloxacin Swelling and Rash    Current Medications: Current Outpatient Medications  Medication Sig Dispense Refill   FLUoxetine (PROZAC) 20 MG capsule Take 1 capsule (20 mg total) by mouth daily. 30 capsule 2   gabapentin (NEURONTIN) 600 MG tablet Take 1 tablet (600 mg total) by mouth 2 (two) times daily. 60 tablet 2   valACYclovir (VALTREX) 1000 MG tablet Take 1,000 mg by mouth 2 (two) times daily.     No current  facility-administered medications for this visit.    ROS: Denies any physical complaints  Objective:  Psychiatric Specialty Exam: There were no vitals taken for this visit.There is no height or weight on file to calculate BMI.  General Appearance: Casual and Well Groomed  Eye Contact:  Good  Speech:  Clear and Coherent and Normal Rate  Volume:  Normal  Mood:   "good"  Affect:  Congruent and Euthymic  Thought Content:  Denies AVH; IOR    Suicidal Thoughts:  No  Homicidal Thoughts:  No  Thought Process:  Goal Directed and Linear  Orientation:  Full (Time, Place, and Person)    Memory:   Grossly intact  Judgment:  Good  Insight:  Good  Concentration:  Concentration: Good  Recall:  NA  Fund of Knowledge: Good  Language: Good  Psychomotor Activity:  Normal  Akathisia:  NA  AIMS (if indicated): not done  Assets:  Communication Skills Desire for Improvement Housing Leisure Time Physical Health Resilience Social Support Transportation Vocational/Educational  ADL's:  Intact  Cognition: WNL  Sleep:  Good   PE: General: sits comfortably in view of camera;  no acute distress  Pulm: no increased work of breathing on room air  MSK: all extremity movements appear intact  Neuro: no focal neurological deficits observed  Gait & Station: unable to assess by video    Metabolic Disorder Labs: No results found for: "HGBA1C", "MPG" No results found for: "PROLACTIN" No results found for: "CHOL", "TRIG", "HDL", "CHOLHDL", "VLDL", "LDLCALC" Lab Results  Component Value Date   TSH 0.739 05/07/2014    Therapeutic Level Labs: No results found for: "LITHIUM" No results found for: "VALPROATE" No results found for: "CBMZ"  Screenings:  GAD-7    Flowsheet Row Video Visit from 07/28/2022 in [facility] Video Visit from 01/20/2022 in [facility] Video Visit from 10/28/2021 in [facility] Video Visit  from 07/29/2021 in [facility]  Total GAD-7 Score 0 0 4 0      PHQ2-9    Flowsheet Row Video Visit from 07/28/2022 in [facility] Video Visit from 01/20/2022 in [facility] Video Visit from 10/28/2021 in [facility] Video Visit from 07/29/2021 in [facility] Office Visit from 10/23/2018 in [facility]  PHQ-2 Total Score 0 0 0 0 0  PHQ-9 Total Score 1 1 0 -- --       Collaboration of Care: Collaboration of Care: Medication Management AEB ongoing medication management and Psychiatrist AEB established with this provider  Patient/Guardian was advised Release of Information must be obtained prior to any record release in order to collaborate their care with an outside provider. Patient/Guardian was advised if they have not already done so to contact the registration department to sign all necessary forms in order for [place] to release information regarding their care.   Consent: Patient/Guardian gives verbal consent for treatment and assignment of benefits for services provided during this visit. Patient/Guardian expressed understanding and agreed to proceed.   Televisit via video: I connected withNAME@ on 10/16/22 at  1:30 PM EDT by a video enabled telemedicine application and verified that I am speaking with the correct person using two identifiers.  Location: Patient: [place] Provider: remote office in [place]   I discussed the limitations of evaluation and management by telemedicine and the availability of in person appointments. The patient expressed understanding and agreed to proceed.  I discussed the assessment and treatment plan with the patient. The patient was provided an opportunity to ask questions and all were answered. The patient agreed with the plan and demonstrated an understanding of the instructions.   The  patient was advised to call back or seek an in-person evaluation if the symptoms worsen or if the condition fails to improve as anticipated.  I provided 40 minutes of non-face-to-face time during this encounter.  Maribella Kuna A  10/16/2022, 7:39 PM

## 2022-10-15 NOTE — Patient Instructions (Signed)
Thank you for attending your appointment today.  -- We did not make any medication changes today. Please continue medications as prescribed.  Please do not make any changes to medications without first discussing with your provider. If you are experiencing a psychiatric emergency, please call 911 or present to your nearest emergency department. Additional crisis, medication management, and therapy resources are included below.  [facility]  [address], [place], [place] [address] [phone] WALK-IN URGENT CARE 24/7 FOR ANYONE [address], [place], [place]  [phone] Fax: [phone] [email or web address] *Interpreters available *Accepts all insurance and uninsured for Urgent Care needs *Accepts Medicaid and uninsured for outpatient treatment (below)      ONLY FOR [place] Residents  Below:    Outpatient New Patient Assessment/Therapy Walk-ins:        Monday -Thursday 8am until slots are full.        Every Friday 1pm-4pm  (first come, first served)                   New Patient Psychiatry/Medication Management        Monday-Friday 8am-11am (first come, first served)               For all walk-ins we ask that you arrive by 7:15am, because patients will be seen in the order of arrival.   

## 2022-10-16 ENCOUNTER — Telehealth (INDEPENDENT_AMBULATORY_CARE_PROVIDER_SITE_OTHER): Payer: No Payment, Other | Admitting: Psychiatry

## 2022-10-16 ENCOUNTER — Encounter (HOSPITAL_COMMUNITY): Payer: Self-pay | Admitting: Psychiatry

## 2022-10-16 DIAGNOSIS — F3178 Bipolar disorder, in full remission, most recent episode mixed: Secondary | ICD-10-CM

## 2022-10-16 MED ORDER — FLUOXETINE HCL 20 MG PO CAPS: 20.0000 mg | ORAL_CAPSULE | Freq: Every day | ORAL | 2 refills | Status: DC

## 2022-10-16 MED ORDER — GABAPENTIN 600 MG PO TABS: 600.0000 mg | ORAL_TABLET | Freq: Two times a day (BID) | ORAL | 2 refills | Status: DC

## 2022-10-18 ENCOUNTER — Other Ambulatory Visit: Payer: Self-pay

## 2022-10-18 NOTE — Addendum Note (Signed)
Addended by: Demetrius Revel on: 10/18/2022 10:57 AM   Modules accepted: Orders

## 2023-01-18 ENCOUNTER — Telehealth (HOSPITAL_COMMUNITY): Payer: No Payment, Other | Admitting: Student

## 2023-01-22 ENCOUNTER — Telehealth (INDEPENDENT_AMBULATORY_CARE_PROVIDER_SITE_OTHER): Payer: Medicaid Other | Admitting: Psychiatry

## 2023-01-22 ENCOUNTER — Encounter (HOSPITAL_COMMUNITY): Payer: Self-pay

## 2023-01-22 DIAGNOSIS — F3178 Bipolar disorder, in full remission, most recent episode mixed: Secondary | ICD-10-CM | POA: Diagnosis not present

## 2023-01-22 MED ORDER — GABAPENTIN 600 MG PO TABS: 600.0000 mg | ORAL_TABLET | Freq: Two times a day (BID) | ORAL | 2 refills | Status: DC

## 2023-01-22 MED ORDER — FLUOXETINE HCL 20 MG PO CAPS: 20.0000 mg | ORAL_CAPSULE | Freq: Every day | ORAL | 2 refills | Status: DC

## 2023-01-22 NOTE — Progress Notes (Signed)
Briana Zhang  01/22/2023 2:22 PM Briana Zhang  MRN:  [id number]  Assessment:  Briana Zhang presents for follow-up evaluation. Today, 01/22/23, patient reports continued stability on below medication regimen. Has maintained sobriety from substances and continues to engage in recovery resources.  Patient denies symptoms of depression, mania and psychosis.  Denies SI, HI, AVH and paranoia.  Recommend continued cessation of substance use and given stability on SSRI, it is felt more likely that previous symptoms attributed to bipolar were better explained by substance use. No changes to medications at this time.   Plan to RTC in 2 months with Dr Sande Rives.   Identifying Information: Briana Zhang is a 42 y.o. female with historical diagnosis of bipolar 1 disorder vs. SIMD, alcohol and polysubstance use in remission, and hepatitis C s/p treatment with undetectable RNA who is an established patient with [facility] participating in follow-up via video conferencing.   Plan:  # Historical diagnosis of bipolar 1 disorder, consider substance induced mood disorder  Anxiety Past medication trials: unknown Status of problem: stable Interventions: -- Continue Prozac 20 mg daily -- Continue Gabapentin 600 mg BID  # Alcohol use disorder in sustained remission  Opioid use disorder in sustained remission  Cocaine use disorder in sustained remission Status of problem: sustained remission Interventions: -- Continue to support patient in maintaining sobriety -- Patient regularly attends NA meetings  Patient was given contact information for behavioral health clinic and was instructed to call 911 for emergencies.   Subjective:  Chief Complaint:  No chief complaint on file.   Interval History:   Last seen by Dr. Lorenda Peck on 10/17/23 . At that time, managed on: Prozac 20 mg daily, Gabapentin 600 mg BID and reporting overall stability. No changes  made at that time.   Today, patient reports she has been completely stable on current medications and is doing.  She denies feeling down or depressed; denies symptoms of mania or hypomania.  Denies psychosis and paranoia.  Denies anxiety. Sleep and appetite have been "normal."   Denies any use of substances for 2-1/2 years.  Had been using heroin and crack among other substances at the time. Denies urges or cravings to return to use. Continues to participate in NA meetings. Feels current regimen works well for her. Denies adverse effects to current regimen and prefers to not make any changes given overall stability.  She is currently working in an [employer].  She lives with her boyfriend and 3 kids (70, 17, 41).  She denies past suicidal attempts but endorses 1 psychiatric hospitalization in 2006 at [facility] and [facility].  She denies any other concerns.  Visit Diagnosis:    ICD-10-CM   1. Bipolar 1 disorder, mixed, full remission (HCC)  F31.78 gabapentin (NEURONTIN) 600 MG tablet    FLUoxetine (PROZAC) 20 MG capsule       Past Psychiatric History:  Diagnoses: Historical diagnosis of bipolar 1 disorder vs. SIMD, polysubstance use (alcohol, opioids, methadone, cocaine) now in remission Substance use:   -- Denies current use of alcohol, tobacco, or illicit drugs  -- Last use of any substances (including crack, heroin) 08/24/2020  Past Medical History:  Past Medical History:  Diagnosis Date   Bipolar 1 disorder ([place])    Cellulitis and abscess    Substance abuse ([place])    IVDU mulitple substances    Past Surgical History:  Procedure Laterality Date   CESAREAN SECTION     CESAREAN SECTION N/A  06/18/2014   Procedure: CESAREAN SECTION;  Surgeon: Adam Phenix, MD;  Location: [facility];  Service: Obstetrics;  Laterality: N/A;   WISDOM TOOTH EXTRACTION      Family Psychiatric History:  Mother: depression  Family History:  Family History  Problem Relation Age of Onset   Cancer Mother     Depression Mother    Diabetes Father    Cancer Maternal Grandmother    Cancer Maternal Grandfather     Social History:  Social History   Socioeconomic History   Marital status: Single    Spouse name: Not on file   Number of children: 3   Years of education: Not on file   Highest education level: GED or equivalent  Occupational History   Occupation: Financial planner   Tobacco Use   Smoking status: Former    Types: E-cigarettes, Cigarettes   Smokeless tobacco: Current  Vaping Use   Vaping Use: Every day  Substance and Sexual Activity   Alcohol use: No   Drug use: Not Currently    Comment: Last use of substances (heroin, cocaine, others) 08/24/2020   Sexual activity: Yes    Birth control/protection: I.U.D.  Other Topics Concern   Not on file  Social History Narrative   Not on file   Social Determinants of Health   Financial Resource Strain: Not on file  Food Insecurity: No Food Insecurity (01/26/2022)   Hunger Vital Sign    Worried About Running Out of Food in the Last Year: Never true    Ran Out of Food in the Last Year: Never true  Transportation Needs: No Transportation Needs (01/26/2022)   PRAPARE - Administrator, Civil Service (Medical): No    Lack of Transportation (Non-Medical): No  Physical Activity: Not on file  Stress: Not on file  Social Connections: Not on file    Allergies:  Allergies  Allergen Reactions   Ciprofloxacin Swelling and Rash    Current Medications: Current Outpatient Medications  Medication Sig Dispense Refill   FLUoxetine (PROZAC) 20 MG capsule Take 1 capsule (20 mg total) by mouth daily. 30 capsule 2   gabapentin (NEURONTIN) 600 MG tablet Take 1 tablet (600 mg total) by mouth 2 (two) times daily. 60 tablet 2   valACYclovir (VALTREX) 1000 MG tablet Take 1,000 mg by mouth 2 (two) times daily.     No current facility-administered medications for this visit.    ROS: Denies any physical  complaints  Objective:  Psychiatric Specialty Exam: There were no vitals taken for this visit.There is no height or weight on file to calculate BMI.  General Appearance: Casual and Well Groomed  Eye Contact:  Good  Speech:  Clear and Coherent and Normal Rate  Volume:  Normal  Mood:   "good"  Affect:  Congruent and Euthymic  Thought Content:  Denies AVH; IOR    Suicidal Thoughts:  No  Homicidal Thoughts:  No  Thought Process:  Goal Directed and Linear  Orientation:  Full (Time, Place, and Person)    Memory:   Grossly intact  Judgment:  Good  Insight:  Good  Concentration:  Concentration: Good  Recall:  NA  Fund of Knowledge: Good  Language: Good  Psychomotor Activity:  Normal  Akathisia:  NA  AIMS (if indicated): not done  Assets:  Communication Skills Desire for Improvement Housing Leisure Time Physical Health Resilience Social Support Transportation Vocational/Educational  ADL's:  Intact  Cognition: WNL  Sleep:  Good   PE: General: sits  comfortably in view of camera; no acute distress  Pulm: no increased work of breathing on room air  MSK: all extremity movements appear intact  Neuro: no focal neurological deficits observed  Gait & Station: unable to assess by video    Metabolic Disorder Labs: No results found for: "HGBA1C", "MPG" No results found for: "PROLACTIN" No results found for: "CHOL", "TRIG", "HDL", "CHOLHDL", "VLDL", "LDLCALC" Lab Results  Component Value Date   TSH 0.739 05/07/2014    Therapeutic Level Labs: No results found for: "LITHIUM" No results found for: "VALPROATE" No results found for: "CBMZ"  Screenings:  GAD-7    Flowsheet Row Video Visit from 07/28/2022 in [facility] Video Visit from 01/20/2022 in [facility] Video Visit from 10/28/2021 in [facility] Video Visit from 07/29/2021 in [facility]  Total GAD-7 Score 0 0 4  0      PHQ2-9    Flowsheet Row Video Visit from 07/28/2022 in [facility] Video Visit from 01/20/2022 in [facility] Video Visit from 10/28/2021 in [facility] Video Visit from 07/29/2021 in [facility] Office Visit from 10/23/2018 in [facility]  PHQ-2 Total Score 0 0 0 0 0  PHQ-9 Total Score 1 1 0 -- --       Collaboration of Care: Collaboration of Care: Medication Management AEB ongoing medication management and Psychiatrist AEB established with this provider  Patient/Guardian was advised Release of Information must be obtained prior to any record release in order to collaborate their care with an outside provider. Patient/Guardian was advised if they have not already done so to contact the registration department to sign all necessary forms in order for [place] to release information regarding their care.   Consent: Patient/Guardian gives verbal consent for treatment and assignment of benefits for services provided during this visit. Patient/Guardian expressed understanding and agreed to proceed.   Televisit via video: I connected with Briana Zhang on 01/22/23 at  2:00 PM EST by a video enabled telemedicine application and verified that I am speaking with the correct person using two identifiers.  Location: Patient: [place] Provider: Clinic in [place]   I discussed the limitations of evaluation and management by telemedicine and the availability of in person appointments. The patient expressed understanding and agreed to proceed.  I discussed the assessment and treatment plan with the patient. The patient was provided an opportunity to ask questions and all were answered. The patient agreed with the plan and demonstrated an understanding of the instructions.   The patient was advised to call back or seek an in-person evaluation if the symptoms  worsen or if the condition fails to improve as anticipated.  I provided 20 minutes of non-face-to-face time during this encounter.  Armando Reichert, MD 01/22/2023, 2:22 PM

## 2023-01-23 ENCOUNTER — Encounter (HOSPITAL_COMMUNITY): Payer: Self-pay | Admitting: Psychiatry

## 2023-02-01 ENCOUNTER — Ambulatory Visit: Payer: Medicaid Other

## 2023-02-01 ENCOUNTER — Other Ambulatory Visit: Payer: BLUE CROSS/BLUE SHIELD

## 2023-06-18 NOTE — Progress Notes (Unsigned)
BH MD Outpatient Progress Note  06/20/2023 11:24 AM Briana Zhang  MRN:  [id number]  Assessment:  Briana Zhang presents for follow-up evaluation. Today, 06/20/23, patient reports continued stability on below medication regimen. Has maintained sobriety from substances and continues to engage in recovery resources. Discussed option to pursue conservative decrease in medications given remission from substances and psychiatric stability however patient prefers to remain on below regimen without change for time being and denies adverse effects.   RTC in 3 months.   Identifying Information: Briana Zhang is a 42 y.o. female with history of SIMD in remission, alcohol and polysubstance use in remission, and hepatitis C s/p treatment with undetectable RNA who is an established patient with [facility] participating in follow-up via video conferencing. Patient had previously been diagnosed with bipolar 1 disorder however on further exploration patient denied history of hypomania/mania outside of periods of active substance use. Given remission of symptoms upon cessation of substances and stability on SSRI, it is felt more likely that previous symptoms attributed to bipolar were better explained by substance use.   Plan:  # Substance induced mood disorder now in remission  Anxiety Past medication trials: unknown Status of problem: stable Interventions: -- Continue Prozac 20 mg daily -- Continue Gabapentin 600 mg BID  # Alcohol use disorder in sustained remission  Opioid use disorder in sustained remission  Cocaine use disorder in sustained remission Status of problem: sustained remission Interventions: -- Continue to support patient in maintaining sobriety -- Patient regularly attends NA meetings  Patient was given contact information for behavioral health clinic and was instructed to call 911 for emergencies.   Subjective:  Chief Complaint:  Chief Complaint   Patient presents with   Medication Management    Interval History:   Patient reports she has been doing "good." Denies periods of persistent depression, excessively elevated mood, anxiety, or irritability. Sleeping well. Denies SI/HI. Denies any recent use of substances; continues to participate in recovery meetings and denies cravings/urges to return to use. Patient prefers to maintain medications at current doses. No questions/concerns.   Visit Diagnosis:    ICD-10-CM   1. GAD (generalized anxiety disorder)  F41.1     2. Cocaine dependence in remission (HCC)  F14.21     3. Alcohol dependence, in remission (HCC)  F10.21     4. Substance induced mood disorder (HCC)  F19.94 FLUoxetine (PROZAC) 20 MG capsule    gabapentin (NEURONTIN) 600 MG tablet    5. Opioid use disorder in remission  F11.91      Past Psychiatric History:  Diagnoses: Historical diagnosis of bipolar 1 disorder vs. 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   [  ] Refer to NICU for discussion regarding neonatal withdrawal   Substance abuse (HCC)    IVDU mulitple substances    Past Surgical History:  Procedure Laterality Date   CESAREAN SECTION     CESAREAN SECTION N/A 06/18/2014   Procedure: CESAREAN SECTION;  Surgeon: Adam Phenix, MD;  Location: [facility];  Service: Obstetrics;  Laterality:  N/A;   WISDOM TOOTH EXTRACTION      Family Psychiatric History:  Mother: depression  Family History:  Family History  Problem Relation Age of Onset   Cancer Mother    Depression Mother     Diabetes Father    Cancer Maternal Grandmother    Cancer Maternal Grandfather     Social History:  Social History   Socioeconomic History   Marital status: Single    Spouse name: Not on file   Number of children: 3   Years of education: Not on file   Highest education level: GED or equivalent  Occupational History   Occupation: Financial planner   Tobacco Use   Smoking status: Former    Types: E-cigarettes, Cigarettes   Smokeless tobacco: Current  Vaping Use   Vaping Use: Every day  Substance and Sexual Activity   Alcohol use: No   Drug use: Not Currently    Comment: Last use of substances (heroin, cocaine, others) 08/24/2020   Sexual activity: Yes    Birth control/protection: I.U.D.  Other Topics Concern   Not on file  Social History Narrative   Not on file   Social Determinants of Health   Financial Resource Strain: Not on file  Food Insecurity: No Food Insecurity (01/26/2022)   Hunger Vital Sign    Worried About Running Out of Food in the Last Year: Never true    Ran Out of Food in the Last Year: Never true  Transportation Needs: No Transportation Needs (01/26/2022)   PRAPARE - Administrator, Civil Service (Medical): No    Lack of Transportation (Non-Medical): No  Physical Activity: Not on file  Stress: Not on file  Social Connections: Not on file    Allergies:  Allergies  Allergen Reactions   Ciprofloxacin Swelling and Rash    Current Medications: Current Outpatient Medications  Medication Sig Dispense Refill   FLUoxetine (PROZAC) 20 MG capsule Take 1 capsule (20 mg total) by mouth daily. 30 capsule 3   gabapentin (NEURONTIN) 600 MG tablet Take 1 tablet (600 mg total) by mouth 2 (two) times daily. 60 tablet 3   valACYclovir (VALTREX) 1000 MG tablet Take 1,000 mg by mouth 2 (two) times daily.     No current facility-administered medications for this visit.    ROS: Denies any physical complaints  Objective:  Psychiatric Specialty  Exam: There were no vitals taken for this visit.There is no height or weight on file to calculate BMI.  General Appearance: Casual and Well Groomed  Eye Contact:  Good  Speech:  Clear and Coherent and Normal Rate  Volume:  Normal  Mood:   "good"  Affect:  Congruent and Euthymic  Thought Content:  Denies AVH; IOR    Suicidal Thoughts:  No  Homicidal Thoughts:  No  Thought Process:  Goal Directed and Linear  Orientation:  Full (Time, Place, and Person)    Memory:   Grossly intact  Judgment:  Good  Insight:  Good  Concentration:  Concentration: Good  Recall:  NA  Fund of Knowledge: Good  Language: Good  Psychomotor Activity:  Normal  Akathisia:  NA  AIMS (if indicated): not done  Assets:  Communication Skills Desire for Improvement Housing Leisure Time Physical Health Resilience Social Support Transportation Vocational/Educational  ADL's:  Intact  Cognition: WNL  Sleep:  Good   PE: General: sits comfortably in view of camera; no acute distress  Pulm: no increased work of breathing on room air  MSK: all  extremity movements appear intact  Neuro: no focal neurological deficits observed  Gait & Station: unable to assess by video    Metabolic Disorder Labs: No results found for: "HGBA1C", "MPG" No results found for: "PROLACTIN" No results found for: "CHOL", "TRIG", "HDL", "CHOLHDL", "VLDL", "LDLCALC" Lab Results  Component Value Date   TSH 0.739 05/07/2014    Therapeutic Level Labs: No results found for: "LITHIUM" No results found for: "VALPROATE" No results found for: "CBMZ"  Screenings:  GAD-7    Flowsheet Row [facility] 07/28/2022 in [facility] Video Visit from 01/20/2022 in [facility] Video Visit from 10/28/2021 in [facility] Video Visit from 07/29/2021 in [facility]  Total GAD-7 Score 0 0 4 0      PHQ2-9    Flowsheet Row Video Visit  from 07/28/2022 in [facility] Video Visit from 01/20/2022 in [facility] Video Visit from 10/28/2021 in [facility] Video Visit from 07/29/2021 in [facility] Office Visit from 10/23/2018 in [facility] for Infectious Disease  PHQ-2 Total Score 0 0 0 0 0  PHQ-9 Total Score 1 1 0 -- --       Collaboration of Care: Collaboration of Care: Medication Management AEB ongoing medication management and Psychiatrist AEB established with this provider  Patient/Guardian was advised Release of Information must be obtained prior to any record release in order to collaborate their care with an outside provider. Patient/Guardian was advised if they have not already done so to contact the registration department to sign all necessary forms in order for [place] to release information regarding their care.   Consent: Patient/Guardian gives verbal consent for treatment and assignment of benefits for services provided during this visit. Patient/Guardian expressed understanding and agreed to proceed.   Televisit via video: I connected wit patient on 06/20/23 at 11:00 AM EDT by a video enabled telemedicine application and verified that I am speaking with the correct person using two identifiers.  Location: Patient: [place] Provider: remote office in [place]   I discussed the limitations of evaluation and management by telemedicine and the availability of in person appointments. The patient expressed understanding and agreed to proceed.  I discussed the assessment and treatment plan with the patient. The patient was provided an opportunity to ask questions and all were answered. The patient agreed with the plan and demonstrated an understanding of the instructions.   The patient was advised to call back or seek an in-person evaluation if the symptoms worsen or if the condition fails to improve as  anticipated.  I provided 15 minutes of non-face-to-face time during this encounter.  Yuchen Fedor A  06/20/2023, 11:24 AM

## 2023-06-20 ENCOUNTER — Telehealth (INDEPENDENT_AMBULATORY_CARE_PROVIDER_SITE_OTHER): Payer: Medicaid Other | Admitting: Psychiatry

## 2023-06-20 ENCOUNTER — Encounter (HOSPITAL_COMMUNITY): Payer: Self-pay | Admitting: Psychiatry

## 2023-06-20 DIAGNOSIS — F411 Generalized anxiety disorder: Secondary | ICD-10-CM

## 2023-06-20 DIAGNOSIS — F1021 Alcohol dependence, in remission: Secondary | ICD-10-CM

## 2023-06-20 DIAGNOSIS — F1421 Cocaine dependence, in remission: Secondary | ICD-10-CM

## 2023-06-20 DIAGNOSIS — F1191 Opioid use, unspecified, in remission: Secondary | ICD-10-CM

## 2023-06-20 DIAGNOSIS — F1994 Other psychoactive substance use, unspecified with psychoactive substance-induced mood disorder: Secondary | ICD-10-CM | POA: Diagnosis not present

## 2023-06-20 DIAGNOSIS — F1121 Opioid dependence, in remission: Secondary | ICD-10-CM | POA: Insufficient documentation

## 2023-06-20 MED ORDER — GABAPENTIN 600 MG PO TABS: 600.0000 mg | ORAL_TABLET | Freq: Two times a day (BID) | ORAL | 3 refills | Status: DC

## 2023-06-20 MED ORDER — FLUOXETINE HCL 20 MG PO CAPS: 20.0000 mg | ORAL_CAPSULE | Freq: Every day | ORAL | 3 refills | Status: DC

## 2023-06-20 NOTE — Patient Instructions (Signed)
Thank you for attending your appointment today.  -- We did not make any medication changes today. Please continue medications as prescribed.  Please do not make any changes to medications without first discussing with your provider. If you are experiencing a psychiatric emergency, please call 911 or present to your nearest emergency department. Additional crisis, medication management, and therapy resources are included below.  [facility]  [address], [place], [place] [address] [phone] WALK-IN URGENT CARE 24/7 FOR ANYONE [address], [place], [place]  [phone] Fax: [phone] [email or web address] *Interpreters available *Accepts all insurance and uninsured for Urgent Care needs *Accepts Medicaid and uninsured for outpatient treatment (below)      ONLY FOR [place] Residents  Below:    Outpatient New Patient Assessment/Therapy Walk-ins:        Monday -Thursday 8am until slots are full.        Every Friday 1pm-4pm  (first come, first served)                   New Patient Psychiatry/Medication Management        Monday-Friday 8am-11am (first come, first served)               For all walk-ins we ask that you arrive by 7:15am, because patients will be seen in the order of arrival.   

## 2023-09-18 NOTE — Progress Notes (Unsigned)
BH MD Outpatient Progress Note  09/19/2023 11:13 AM Briana Zhang  MRN:  [id number]  Assessment:  Briana Zhang presents for follow-up evaluation. Today, 09/19/23, patient reports continued psychiatric stability and continues to remain abstinent of all substances. Denies any urges or cravings to return to use. Have previously discussed option to pursue conservative decrease in medications given remission from substances and psychiatric stability however patient opts to remain on below regimen without change.   RTC in 3 months by video.  Identifying Information: Briana Zhang is a 42 y.o. female with history of SIMD in remission, alcohol and polysubstance use in remission, and hepatitis C s/p treatment with undetectable RNA who is an established patient with [facility] participating in follow-up via video conferencing. Patient had previously been diagnosed with bipolar 1 disorder however on further exploration patient denied history of hypomania/mania outside of periods of active substance use. Given remission of symptoms upon cessation of substances and stability on SSRI, it is felt more likely that previous symptoms attributed to bipolar were better explained by substance use.   Plan:  # Substance induced mood disorder now in remission  Anxiety Past medication trials: unknown Status of problem: stable Interventions: -- Continue Prozac 20 mg daily -- Continue Gabapentin 600 mg BID  # Alcohol use disorder in sustained remission  Opioid use disorder in sustained remission  Cocaine use disorder in sustained remission Status of problem: sustained remission Interventions: -- Continue to support patient in maintaining sobriety -- Patient regularly attends NA meetings  Patient was given contact information for behavioral health clinic and was instructed to call 911 for emergencies.   Subjective:  Chief Complaint:  Chief Complaint  Patient presents with    Medication Management    Interval History:   Patient reports she is doing well and taking medications as prescribed; denies any side effects. Denies periods of depression, excessively elevation, or anxiety. Denies any recent substance use or desire to return to use. Sleeping and eating well. Continues to work and is in school; functioning overall well. No questions/concerns at this time.    Visit Diagnosis:    ICD-10-CM   1. Substance induced mood disorder (HCC)  F19.94 FLUoxetine (PROZAC) 20 MG capsule    gabapentin (NEURONTIN) 600 MG tablet    2. Opioid use disorder in remission  F11.91     3. Alcohol dependence, in remission (HCC)  F10.21     4. Cocaine dependence in remission (HCC)  F14.21       Past Psychiatric History:  Diagnoses: Historical diagnosis of bipolar 1 disorder vs. 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   [  ] Refer to NICU for discussion regarding neonatal withdrawal   Substance abuse (HCC)    IVDU mulitple substances    Past Surgical History:  Procedure Laterality Date   CESAREAN SECTION     CESAREAN SECTION N/A 06/18/2014   Procedure: CESAREAN SECTION;  Surgeon: Adam Phenix, MD;  Location: [facility];  Service: Obstetrics;  Laterality: N/A;   WISDOM TOOTH EXTRACTION  Family Psychiatric History:  Mother: depression  Family History:  Family History  Problem Relation Age of Onset   Cancer Mother    Depression Mother    Diabetes Father    Cancer Maternal Grandmother    Cancer Maternal Grandfather     Social History:   Employment: Production designer, theatre/television/film at [employer]  Social History   Socioeconomic History   Marital status: Single    Spouse name: Not on file   Number of children: 3   Years of education: Not on file   Highest education level: GED or equivalent  Occupational History   Occupation: Financial planner   Tobacco Use   Smoking status: Former    Types: E-cigarettes, Cigarettes   Smokeless tobacco: Current  Vaping Use   Vaping status: Every Day  Substance and Sexual Activity   Alcohol use: No   Drug use: Not Currently    Comment: Last use of substances (heroin, cocaine, others) 08/24/2020   Sexual activity: Yes    Birth control/protection: I.U.D.  Other Topics Concern   Not on file  Social History Narrative   Not on file   Social Determinants of Health   Financial Resource Strain: Medium Risk (03/11/2023)   Received from [facility]   Overall Financial Resource Strain (CARDIA)    Difficulty of Paying Living Expenses: Somewhat hard  Food Insecurity: No Food Insecurity (03/11/2023)   Received from [facility]   Hunger Vital Sign    Worried About Running Out of Food in the Last Year: Never true    Ran Out of Food in the Last Year: Never true  Transportation Needs: No Transportation Needs (03/11/2023)   [facility] from [facility]   PRAPARE - Transportation    Lack of Transportation (Medical): No    Lack of Transportation (Non-Medical): No  Physical Activity: Insufficiently Active (03/11/2023)   Received from [facility]   Exercise Vital Sign    Days of Exercise per Week: 4 days    Minutes of Exercise per Session: 10 min  Stress: No Stress Concern Present (07/24/2023)   Received from [facility] - Occupational Stress Questionnaire    Feeling of Stress : Not at all  Social Connections: Moderately Integrated (03/11/2023)   Received from [facility]    Social Network    How would you rate your social network (family, work, friends)?: Adequate participation with social networks    Allergies:  Allergies  Allergen Reactions   Ciprofloxacin Swelling and Rash    Current Medications: Current Outpatient Medications  Medication Sig Dispense Refill   ondansetron (ZOFRAN-ODT) 4 MG disintegrating tablet Take 4 mg by mouth daily as needed for nausea.     FLUoxetine (PROZAC) 20 MG capsule Take 1 capsule (20 mg total) by mouth daily. 30 capsule 3   gabapentin (NEURONTIN) 600 MG tablet Take 1 tablet (600 mg total) by mouth 2 (two) times daily. 60 tablet 3   valACYclovir (VALTREX) 1000 MG tablet Take 1,000 mg by mouth 2 (two) times daily.     No current facility-administered medications for this visit.    ROS: Denies any physical complaints  Objective:  Psychiatric Specialty Exam: There were no vitals taken for this visit.There is no height or weight on file to calculate BMI.  General Appearance: Casual and Well Groomed  Eye Contact:  Good  Speech:  Clear and Coherent and Normal Rate  Volume:  Normal  Mood:   "good"  Affect:  Congruent and Euthymic  Thought Content:  Denies AVH; IOR    Suicidal Thoughts:  No  Homicidal Thoughts:  No  Thought Process:  Goal Directed and Linear; Brief in responses  Orientation:  Full (Time, Place, and Person)    Memory:   Grossly intact  Judgment:  Good  Insight:  Good  Concentration:  Concentration: Good  Recall:  NA  Fund of Knowledge: Good  Language: Good  Psychomotor Activity:  Normal  Akathisia:  NA  AIMS (if indicated): not done  Assets:  Communication Skills Desire for Improvement Housing Leisure Time Physical Health Resilience Social Support Transportation Vocational/Educational  ADL's:  Intact  Cognition: WNL  Sleep:  Good   PE: General: sits comfortably in view of camera; no acute distress  Pulm: no increased work of breathing on room air  MSK: all extremity movements  appear intact  Neuro: no focal neurological deficits observed  Gait & Station: unable to assess by video    Metabolic Disorder Labs: No results found for: "HGBA1C", "MPG" No results found for: "PROLACTIN" No results found for: "CHOL", "TRIG", "HDL", "CHOLHDL", "VLDL", "LDLCALC" Lab Results  Component Value Date   TSH 0.739 05/07/2014    Therapeutic Level Labs: No results found for: "LITHIUM" No results found for: "VALPROATE" No results found for: "CBMZ"  Screenings:  GAD-7    Flowsheet Row Video Visit from 07/28/2022 in [facility] Video Visit from 01/20/2022 in [facility] Video Visit from 10/28/2021 in [facility] Video Visit from 07/29/2021 in [facility]  Total GAD-7 Score 0 0 4 0      PHQ2-9    Flowsheet Row [facility] Visit from 01/20/2022 in [facility] Video Visit from 10/28/2021 in [facility] Video Visit from 07/29/2021 in [facility] Office Visit from 10/23/2018 in [facility] for Infectious Disease  PHQ-2 Total Score 0 0 0 0 0  PHQ-9 Total Score 1 1 0 -- --       Collaboration of Care: Collaboration of Care: Medication Management AEB ongoing medication management and Psychiatrist AEB established with this provider  Patient/Guardian was advised Release of Information must be obtained prior to any record release in order to collaborate their care with an outside provider. Patient/Guardian was advised if they have not already done so to contact the registration department to sign all necessary forms in order for [place] to release information regarding their care.   Consent: Patient/Guardian gives verbal consent for treatment and assignment of benefits for services provided during this visit.  Patient/Guardian expressed understanding and agreed to proceed.   Televisit via video: I connected wit patient on 09/19/23 at 11:00 AM EDT by a video enabled telemedicine application and verified that I am speaking with the correct person using two identifiers.  Location: Patient: [place] Provider: remote office in [place]   I discussed the limitations of evaluation and management by telemedicine and the availability of in person appointments. The patient expressed understanding and agreed to proceed.  I discussed the assessment and treatment plan with the patient. The patient was provided an opportunity to ask questions and all were answered. The patient agreed with the plan and demonstrated an understanding of the instructions.   The patient was advised to call back or seek an in-person evaluation if the symptoms  worsen or if the condition fails to improve as anticipated.  I provided 15 minutes dedicated to the care of this patient via video on the date of this encounter to include chart review, face-to-face time with the patient, medication management/counseling.  Esperanza Madrazo A  09/19/2023, 11:13 AM

## 2023-09-19 ENCOUNTER — Encounter (HOSPITAL_COMMUNITY): Payer: Self-pay | Admitting: Psychiatry

## 2023-09-19 ENCOUNTER — Telehealth (HOSPITAL_COMMUNITY): Payer: Medicaid Other | Admitting: Psychiatry

## 2023-09-19 DIAGNOSIS — F1994 Other psychoactive substance use, unspecified with psychoactive substance-induced mood disorder: Secondary | ICD-10-CM | POA: Diagnosis not present

## 2023-09-19 DIAGNOSIS — F1421 Cocaine dependence, in remission: Secondary | ICD-10-CM | POA: Diagnosis not present

## 2023-09-19 DIAGNOSIS — F1191 Opioid use, unspecified, in remission: Secondary | ICD-10-CM

## 2023-09-19 DIAGNOSIS — F1021 Alcohol dependence, in remission: Secondary | ICD-10-CM | POA: Diagnosis not present

## 2023-09-19 MED ORDER — GABAPENTIN 600 MG PO TABS: 600.0000 mg | ORAL_TABLET | Freq: Two times a day (BID) | ORAL | 3 refills | Status: DC

## 2023-09-19 MED ORDER — FLUOXETINE HCL 20 MG PO CAPS: 20.0000 mg | ORAL_CAPSULE | Freq: Every day | ORAL | 3 refills | Status: DC

## 2023-09-19 NOTE — Patient Instructions (Signed)
Thank you for attending your appointment today.  -- We did not make any medication changes today. Please continue medications as prescribed.  Please do not make any changes to medications without first discussing with your provider. If you are experiencing a psychiatric emergency, please call 911 or present to your nearest emergency department. Additional crisis, medication management, and therapy resources are included below.  [facility]  [address], [place], [place] [address] [phone] WALK-IN URGENT CARE 24/7 FOR ANYONE [address], [place], [place]  [phone] Fax: [phone] [email or web address] *Interpreters available *Accepts all insurance and uninsured for Urgent Care needs *Accepts Medicaid and uninsured for outpatient treatment (below)      ONLY FOR [place] Residents  Below:    Outpatient New Patient Assessment/Therapy Walk-ins:        Monday -Thursday 8am until slots are full.        Every Friday 1pm-4pm  (first come, first served)                   New Patient Psychiatry/Medication Management        Monday-Friday 8am-11am (first come, first served)               For all walk-ins we ask that you arrive by 7:15am, because patients will be seen in the order of arrival.   

## 2023-12-11 NOTE — Progress Notes (Unsigned)
BH MD Outpatient Progress Note  12/12/2023 2:24 PM Briana Zhang  MRN:  [id number]  Assessment:  Briana Zhang presents for follow-up evaluation. Today, 12/12/23, patient reports overall stability of mood and anxiety although has noticed mild increase in irritability at end of the work day. She denies any related dysfunction and it does not appear to rise to level to warrant change to medications at this time. Will continue to monitor and if irritability worsens/persists, can consider further titration of Prozac as she has found this medication historically helpful for this symptom. She continues to remain abstinent of all substances and remains engaged in support groups. No changes to plan of care at this time.  RTC in 3 months by video.  Identifying Information: Briana Zhang is a 42 y.o. female with history of SIMD in remission, alcohol and polysubstance use in remission, and hepatitis C s/p treatment with undetectable RNA who is an established patient with [facility] participating in follow-up via video conferencing. Patient had previously been diagnosed with bipolar 1 disorder however on further exploration patient denied history of hypomania/mania outside of periods of active substance use/withdrawal. Given remission of symptoms upon cessation of substances and stability on SSRI, it is felt likely that previous symptoms attributed to bipolar are better explained by substance use.   Plan:  # Substance induced mood disorder now in remission  Anxiety Status of problem: stable Interventions: -- Continue Prozac 20 mg daily -- Continue Gabapentin 600 mg BID  # Alcohol use disorder in sustained remission  Opioid use disorder in sustained remission  Cocaine use disorder in sustained remission Status of problem: sustained remission Interventions: -- Continue to support patient in maintaining sobriety -- Patient regularly attends NA meetings  Patient was given  contact information for behavioral health clinic and was instructed to call 911 for emergencies.   Subjective:  Chief Complaint:  Chief Complaint  Patient presents with   Medication Management    Interval History:   Patient reports she continues to do "good" and taking medications as prescribed. Denies periods of depression or significant anxiety. Has noticed increased irritability at the end of the work day - wonders if she may be "bipolar" due to these mood swings. Reports history of mania and psychosis in 2006 when withdrawing from opioids but denies any episodes like this outside of substances. Reports mood lately is "good" and denies euphoria or excessively elevated mood. Has historically found Prozac helpful for irritability.  Denies related dysfunction from irritability. Continues to sleep and eat well. Denies any use of substances and remains engaged in support groups.  She does not feel that irritability is significant enough to warrant further titration of Prozac at this time and opts to remain at current dosing while continuing to monitor.  Visit Diagnosis:    ICD-10-CM   1. Substance induced mood disorder (HCC)  F19.94 FLUoxetine (PROZAC) 20 MG capsule    gabapentin (NEURONTIN) 600 MG tablet    2. Opioid use disorder in remission  F11.91     3. Alcohol dependence, in remission (HCC)  F10.21     4. Cocaine dependence in remission (HCC)  F14.21      Past Psychiatric History:  Diagnoses: Historical diagnosis of bipolar 1 disorder vs. 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   [  ] Refer to NICU for discussion regarding neonatal withdrawal   Substance abuse (HCC)    IVDU mulitple substances    Past Surgical History:  Procedure Laterality Date   CESAREAN SECTION     CESAREAN SECTION N/A 06/18/2014   Procedure: CESAREAN SECTION;  Surgeon: Adam Phenix, MD;  Location: [facility];  Service: Obstetrics;  Laterality: N/A;   WISDOM TOOTH EXTRACTION      Family Psychiatric History:  Mother: depression  Family History:  Family History  Problem Relation Age of Onset   Cancer Mother    Depression Mother    Diabetes Father    Cancer Maternal Grandmother    Cancer Maternal Grandfather     Social History:  Employment: Production designer, theatre/television/film at [employer]  Social History   Socioeconomic History   Marital status: Single    Spouse name: Not on file   Number of children: 3   Years of education: Not on file   Highest education level: GED or equivalent  Occupational History   Occupation: Financial planner   Tobacco Use   Smoking status: Former    Types: E-cigarettes, Cigarettes   Smokeless tobacco: Current  Vaping Use   Vaping status: Every Day  Substance and Sexual Activity   Alcohol use: No   Drug use: Not Currently    Comment: Last use of substances (heroin, cocaine, others) 08/24/2020   Sexual activity: Yes    Birth control/protection: I.U.D.  Other Topics Concern   Not on file  Social History Narrative   Not on file   Social Drivers of Health   Financial Resource Strain: Medium Risk (03/11/2023)   Received from [facility]   Overall Financial Resource Strain (CARDIA)    Difficulty of Paying Living Expenses: Somewhat hard  Food Insecurity: No Food Insecurity (03/11/2023)   Received from [facility], [facility]   Hunger Vital Sign    Worried About Running Out of Food in the Last Year: Never true    Ran Out of Food in the Last Year: Never true  Transportation Needs: No Transportation Needs  (03/11/2023)   Received from [facility], [facility]   PRAPARE - Transportation    Lack of Transportation (Medical): No    Lack of Transportation (Non-Medical): No  Physical Activity: Insufficiently Active (03/11/2023)   Received from [facility]   Exercise Vital Sign    Days of Exercise per Week: 4 days    Minutes of Exercise per Session: 10 min  Stress: No Stress Concern Present (07/24/2023)   Received from [facility] - Occupational Stress Questionnaire    Feeling of Stress : Not at all  Social Connections: Moderately Integrated (03/11/2023)   Received from [facility], [facility]   Social Network    How would you rate your social network (family, work, friends)?: Adequate participation with social networks    Allergies:  Allergies  Allergen Reactions   Ciprofloxacin Swelling and Rash    Current Medications: Current Outpatient Medications  Medication Sig Dispense Refill   FLUoxetine (PROZAC) 20 MG capsule Take 1 capsule (20 mg total) by mouth daily. 30 capsule 3   gabapentin (NEURONTIN) 600 MG tablet Take 1  tablet (600 mg total) by mouth 2 (two) times daily. 60 tablet 3   ondansetron (ZOFRAN-ODT) 4 MG disintegrating tablet Take 4 mg by mouth daily as needed for nausea.     valACYclovir (VALTREX) 1000 MG tablet Take 1,000 mg by mouth 2 (two) times daily.     No current facility-administered medications for this visit.    ROS: Denies any physical complaints  Objective:  Psychiatric Specialty Exam: There were no vitals taken for this visit.There is no height or weight on file to calculate BMI.  General Appearance: Casual and Well Groomed  Eye Contact:  Good  Speech:  Clear and Coherent and Normal Rate  Volume:  Normal  Mood:   "good"  Affect:  Congruent and Euthymic  Thought Content:  Denies AVH; IOR    Suicidal Thoughts:  No  Homicidal Thoughts:  No  Thought Process:  Goal Directed and Linear   Orientation:  Full (Time, Place, and Person)    Memory:   Grossly intact  Judgment:  Good  Insight:  Good  Concentration:  Concentration: Good  Recall:  NA  Fund of Knowledge: Good  Language: Good  Psychomotor Activity:  Normal  Akathisia:  NA  AIMS (if indicated): not done  Assets:  Communication Skills Desire for Improvement Housing Leisure Time Physical Health Resilience Social Support Transportation Vocational/Educational  ADL's:  Intact  Cognition: WNL  Sleep:  Good   PE: General: sits comfortably in view of camera; no acute distress  Pulm: no increased work of breathing on room air  MSK: all extremity movements appear intact  Neuro: no focal neurological deficits observed  Gait & Station: unable to assess by video    Metabolic Disorder Labs: No results found for: "HGBA1C", "MPG" No results found for: "PROLACTIN" No results found for: "CHOL", "TRIG", "HDL", "CHOLHDL", "VLDL", "LDLCALC" Lab Results  Component Value Date   TSH 0.739 05/07/2014    Therapeutic Level Labs: No results found for: "LITHIUM" No results found for: "VALPROATE" No results found for: "CBMZ"  Screenings:  GAD-7    Flowsheet Row Video Visit from 07/28/2022 in [facility] Video Visit from 01/20/2022 in [facility] Video Visit from 10/28/2021 in [facility] Video Visit from 07/29/2021 in [facility]  Total GAD-7 Score 0 0 4 0      PHQ2-9    Flowsheet Row Video Visit from 07/28/2022 in [facility] Video Visit from 01/20/2022 in [facility] Video Visit from 10/28/2021 in [facility] Video Visit from 07/29/2021 in [facility] Office Visit from 10/23/2018 in [facility] [place]  PHQ-2 Total Score 0 0 0 0 0  PHQ-9 Total  Score 1 1 0 -- --       Collaboration of Care: Collaboration of Care: Medication Management AEB ongoing medication management and Psychiatrist AEB established with this provider  Patient/Guardian was advised Release of Information must be obtained prior to any record release in order to collaborate their care with an outside provider. Patient/Guardian was advised if they have not already done so to contact the registration department to sign all necessary forms in order for [place] to release information regarding their care.   Consent: Patient/Guardian gives verbal consent for treatment and assignment of benefits for services provided during this visit. Patient/Guardian expressed understanding and agreed to proceed.  Televisit via video: I connected wit patient on 12/12/23 at  2:00 PM EST by a video enabled telemedicine application and verified that I am speaking with the correct person using two identifiers.  Location: Patient: Briana Zhang Provider: remote office in [place]   I discussed the limitations of evaluation and management by telemedicine and the availability of in person appointments. The patient expressed understanding and agreed to proceed.  I discussed the assessment and treatment plan with the patient. The patient was provided an opportunity to ask questions and all were answered. The patient agreed with the plan and demonstrated an understanding of the instructions.   The patient was advised to call back or seek an in-person evaluation if the symptoms worsen or if the condition fails to improve as anticipated.  I provided 25 minutes dedicated to the care of this patient via video on the date of this encounter to include chart review, face-to-face time with the patient, medication management/counseling.  Jayke Caul A Cammi Consalvo 12/12/2023, 2:24 PM

## 2023-12-12 ENCOUNTER — Telehealth (HOSPITAL_COMMUNITY): Payer: Medicaid Other | Admitting: Psychiatry

## 2023-12-12 ENCOUNTER — Encounter (HOSPITAL_COMMUNITY): Payer: Self-pay | Admitting: Psychiatry

## 2023-12-12 DIAGNOSIS — F1191 Opioid use, unspecified, in remission: Secondary | ICD-10-CM | POA: Diagnosis not present

## 2023-12-12 DIAGNOSIS — F1421 Cocaine dependence, in remission: Secondary | ICD-10-CM | POA: Diagnosis not present

## 2023-12-12 DIAGNOSIS — F1994 Other psychoactive substance use, unspecified with psychoactive substance-induced mood disorder: Secondary | ICD-10-CM

## 2023-12-12 DIAGNOSIS — F1021 Alcohol dependence, in remission: Secondary | ICD-10-CM

## 2023-12-12 MED ORDER — GABAPENTIN 600 MG PO TABS: 600.0000 mg | ORAL_TABLET | Freq: Two times a day (BID) | ORAL | 3 refills | Status: DC

## 2023-12-12 MED ORDER — FLUOXETINE HCL 20 MG PO CAPS: 20.0000 mg | ORAL_CAPSULE | Freq: Every day | ORAL | 3 refills | Status: DC

## 2023-12-12 NOTE — Patient Instructions (Signed)

## 2024-02-03 NOTE — Progress Notes (Unsigned)
  Cardiology Office Note:  .   Date:  02/03/2024  ID:  Briana Zhang, DOB 1981-05-02, MRN [id number] PCP: Patient, No Pcp Per  [facility] Providers Cardiologist:  None { Click to update primary MD,subspecialty MD or APP then REFRESH:1}   History of Present Illness: .    Feb. 14, 2025   Briana Zhang is a 43 y.o. female with hx of IVDA, Hep C, bipolar disease  and palpitations     ROS: ***  Studies Reviewed: .        *** Risk Assessment/Calculations:   {Does this patient have ATRIAL FIBRILLATION?:[phone]} No BP recorded.  {Refresh Note OR Click here to enter BP  :1}***       Physical Exam:   VS:  There were no vitals taken for this visit.   Wt Readings from Last 3 Encounters:  01/26/22 127 lb 3.2 oz (57.7 kg)  12/24/18 130 lb (59 kg)  12/10/18 130 lb (59 kg)    GEN: Well nourished, well developed in no acute distress NECK: No JVD; No carotid bruits CARDIAC: ***RRR, no murmurs, rubs, gallops RESPIRATORY:  Clear to auscultation without rales, wheezing or rhonchi  ABDOMEN: Soft, non-tender, non-distended EXTREMITIES:  No edema; No deformity   ASSESSMENT AND PLAN: .   ***    {Are you ordering a CV Procedure (e.g. stress test, cath, DCCV, TEE, etc)?   Press F2        :[phone]}  Dispo: ***  Signed, Kristeen Miss, MD

## 2024-02-08 ENCOUNTER — Encounter: Payer: Self-pay | Admitting: Cardiovascular Disease

## 2024-02-08 ENCOUNTER — Ambulatory Visit: Payer: Medicaid Other | Attending: Cardiovascular Disease | Admitting: Cardiovascular Disease

## 2024-02-08 VITALS — BP 102/58 | HR 65 | Ht 61.0 in | Wt 131.0 lb

## 2024-02-08 DIAGNOSIS — R002 Palpitations: Secondary | ICD-10-CM | POA: Insufficient documentation

## 2024-02-08 DIAGNOSIS — E785 Hyperlipidemia, unspecified: Secondary | ICD-10-CM | POA: Insufficient documentation

## 2024-02-08 DIAGNOSIS — E782 Mixed hyperlipidemia: Secondary | ICD-10-CM

## 2024-02-08 NOTE — Patient Instructions (Addendum)
You should have your primary doctor order an Lipoprotein(a)     Follow-Up: At [facility], you and your health needs are our priority.  As part of our continuing mission to provide you with exceptional heart care, we have created designated Provider Care Teams.  These Care Teams include your primary Cardiologist (physician) and Advanced Practice Providers (APPs -  Physician Assistants and Nurse Practitioners) who all work together to provide you with the care you need, when you need it.  We recommend signing up for the patient portal called "MyChart".  Sign up information is provided on this After Visit Summary.  MyChart is used to connect with patients for Virtual Visits (Telemedicine).  Patients are able to view lab/test results, encounter notes, upcoming appointments, etc.  Non-urgent messages can be sent to your provider as well.   To learn more about what you can do with MyChart, go to [email or web address].    Your next appointment:   As Needed  Provider:   Kristeen Miss, MD   1st Floor: - Lobby - Registration  - Pharmacy  - Lab - Cafe  2nd Floor: - PV Lab - Diagnostic Testing (echo, CT, nuclear med)  3rd Floor: - Vacant  4th Floor: - TCTS (cardiothoracic surgery) - AFib Clinic - Structural Heart Clinic - Vascular Surgery  - Vascular Ultrasound  5th Floor: - HeartCare Cardiology (general and EP) - Clinical Pharmacy for coumadin, hypertension, lipid, weight-loss medications, and med management appointments    Valet parking services will be available as well.

## 2024-03-06 NOTE — Progress Notes (Signed)
 BH MD Outpatient Progress Note  03/10/2024 2:40 PM Briana Zhang  MRN:  [id number]  Assessment:  Briana Zhang presents for follow-up evaluation. Today, 03/10/24, patient reports continued psychiatric stability and feels below regimen is working well for her. She reports some interval increase in anxiety but attributes this to being in the process of switching jobs and feels this is in proportion to current stressor; she identifies it has been helpful to lean on sponsor and community supports. She continues to remain abstinent of all substances and denies any urges/cravings to return to use. No changes to plan of care at this time.  RTC in 3 months by video.  Identifying Information: Briana Zhang is a 43 y.o. female with history of SIMD in remission, alcohol and polysubstance use in remission, and hepatitis C s/p treatment with undetectable RNA who is an established patient with [facility] participating in follow-up via video conferencing. Patient had previously been diagnosed with bipolar 1 disorder however on further exploration patient denied history of hypomania/mania outside of periods of active substance use/withdrawal. Given remission of symptoms upon cessation of substances and stability on SSRI, it is felt likely that previous symptoms attributed to bipolar are better explained by substance use.   Plan:  # Substance induced mood disorder now in remission  Anxiety Status of problem: stable Interventions: -- Continue Prozac 20 mg daily -- Continue Gabapentin 600 mg BID  # Alcohol use disorder in sustained remission  Opioid use disorder in sustained remission  Cocaine use disorder in sustained remission Status of problem: sustained remission Interventions: -- Continue to support patient in maintaining sobriety -- Patient regularly attends NA meetings  Patient was given contact information for behavioral health clinic and was instructed to call 911 for  emergencies.   Subjective:  Chief Complaint:  Chief Complaint  Patient presents with   Medication Management    Interval History:  Patient reports she is doing well and feels Prozac continues to work well for her. Some anxiety the last few weeks but attributes this switching jobs. Has found it helpful to talk about this stress at meetings and with sponsor; recently started getting more involved in church. Denies urges/desire to return to use. Sleeping well with about 6-8 hours nightly, appetite has been stable. Denies SI/HI.   No questions/concerns and amenable to continuing medications as prescribed.   Visit Diagnosis:    ICD-10-CM   1. Substance induced mood disorder (HCC)  F19.94 FLUoxetine (PROZAC) 20 MG capsule    gabapentin (NEURONTIN) 600 MG tablet    2. Opioid use disorder in remission  F11.91     3. Alcohol dependence, in remission (HCC)  F10.21     4. Cocaine dependence in remission (HCC)  F14.21       Past Psychiatric History:  Diagnoses: Historical diagnosis of bipolar 1 disorder vs. 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   [  ] Refer to NICU for discussion regarding neonatal withdrawal   Substance abuse (HCC)    IVDU mulitple substances    Past Surgical  History:  Procedure Laterality Date   CESAREAN SECTION     CESAREAN SECTION N/A 06/18/2014   Procedure: CESAREAN SECTION;  Surgeon: Adam Phenix, MD;  Location: [facility];  Service: Obstetrics;  Laterality: N/A;   WISDOM TOOTH EXTRACTION      Family Psychiatric History:  Mother:  depression  Family History:  Family History  Problem Relation Age of Onset   Cancer Mother    Depression Mother    Diabetes Father    Cancer Maternal Grandmother    Cancer Maternal Grandfather     Social History:  Employment: Production designer, theatre/television/film at [employer]  Social History   Socioeconomic History   Marital status: Single    Spouse name: Not on file   Number of children: 3   Years of education: Not on file   Highest education level: GED or equivalent  Occupational History   Occupation: Financial planner   Tobacco Use   Smoking status: Former    Types: E-cigarettes, Cigarettes   Smokeless tobacco: Current  Vaping Use   Vaping status: Every Day  Substance and Sexual Activity   Alcohol use: No   Drug use: Not Currently    Comment: Last use of substances (heroin, cocaine, others) 08/24/2020   Sexual activity: Yes    Birth control/protection: I.U.D.  Other Topics Concern   Not on file  Social History Narrative   Not on file   Social Drivers of Health   Financial Resource Strain: Low Risk  (03/10/2024)   Received from [facility]   Overall Financial Resource Strain (CARDIA)    Difficulty of Paying Living Expenses: Not hard at all  Food Insecurity: No Food Insecurity (03/10/2024)   Received from [facility]   Hunger Vital Sign    Worried About Running Out of Food in the Last Year: Never true    Ran Out of Food in the Last Year: Never true  Transportation Needs: No Transportation Needs (03/10/2024)   Received from [facility] - Transportation    Lack of Transportation (Medical): No    Lack of Transportation (Non-Medical): No  Physical Activity: Insufficiently Active (03/10/2024)   Received from [facility]   Exercise Vital Sign    Days of Exercise per Week: 3 days    Minutes of Exercise per Session: 40 min  Stress: No Stress Concern Present (03/10/2024)   Received from [facility] - Occupational Stress  Questionnaire    Feeling of Stress : Only a little  Social Connections: Moderately Integrated (03/10/2024)   Received from [facility]   Social Network    How would you rate your social network (family, work, friends)?: Adequate participation with social networks    Allergies:  Allergies  Allergen Reactions   Ciprofloxacin Swelling and Rash    Current Medications: Current Outpatient Medications  Medication Sig Dispense Refill   FLUoxetine (PROZAC) 20 MG capsule Take 1 capsule (20 mg total) by mouth daily. 30 capsule 3   gabapentin (NEURONTIN) 600 MG tablet Take 1 tablet (600 mg total) by mouth 2 (two) times daily. 60 tablet 3   ondansetron (ZOFRAN-ODT) 4 MG disintegrating tablet Take 4 mg by mouth daily as needed for nausea.     valACYclovir (VALTREX) 1000 MG tablet Take 1,000 mg by mouth 2 (two) times daily.     No current facility-administered medications for this visit.    ROS: Denies any physical complaints  Objective:  Psychiatric Specialty Exam: There were  no vitals taken for this visit.There is no height or weight on file to calculate BMI.  General Appearance: Casual and Well Groomed  Eye Contact:  Good  Speech:  Clear and Coherent and Normal Rate  Volume:  Normal  Mood:   "good"  Affect:  Congruent and Euthymic; constricted per baseline  Thought Content:  Denies AVH; IOR    Suicidal Thoughts:  No  Homicidal Thoughts:  No  Thought Process:  Goal Directed and Linear  Orientation:  Full (Time, Place, and Person)    Memory:   Grossly intact  Judgment:  Good  Insight:  Good  Concentration:  Concentration: Good  Recall:  NA  Fund of Knowledge: Good  Language: Good  Psychomotor Activity:  Normal  Akathisia:  NA  AIMS (if indicated): not done  Assets:  Communication Skills Desire for Improvement Housing Leisure Time Physical Health Resilience Social Support Transportation Vocational/Educational  ADL's:  Intact  Cognition: WNL  Sleep:  Good    PE: General: sits comfortably in view of camera; no acute distress  Pulm: no increased work of breathing on room air  MSK: all extremity movements appear intact  Neuro: no focal neurological deficits observed  Gait & Station: unable to assess by video    Metabolic Disorder Labs: No results found for: "HGBA1C", "MPG" No results found for: "PROLACTIN" No results found for: "CHOL", "TRIG", "HDL", "CHOLHDL", "VLDL", "LDLCALC" Lab Results  Component Value Date   TSH 0.739 05/07/2014    Therapeutic Level Labs: No results found for: "LITHIUM" No results found for: "VALPROATE" No results found for: "CBMZ"  Screenings:  GAD-7    Flowsheet Row Video Visit from 07/28/2022 in [facility] Video Visit from 01/20/2022 in [facility] Video Visit from 10/28/2021 in [facility] Video Visit from 07/29/2021 in [facility]  Total GAD-7 Score 0 0 4 0      PHQ2-9    Flowsheet Row Video Visit from 07/28/2022 in [facility] Video Visit from 01/20/2022 in [facility] Video Visit from 10/28/2021 in [facility] Video Visit from 07/29/2021 in [facility] Office Visit from 10/23/2018 in [facility] [place]  PHQ-2 Total Score 0 0 0 0 0  PHQ-9 Total Score 1 1 0 -- --       Collaboration of Care: Collaboration of Care: Medication Management AEB ongoing medication management and Psychiatrist AEB established with this provider  Patient/Guardian was advised Release of Information must be obtained prior to any record release in order to collaborate their care with an outside provider. Patient/Guardian was advised if they have not already done so to contact the registration department to sign all necessary forms in order for [place] to release  information regarding their care.   Consent: Patient/Guardian gives verbal consent for treatment and assignment of benefits for services provided during this visit. Patient/Guardian expressed understanding and agreed to proceed.   Televisit via video: I connected wit patient on 03/10/24 at  2:30 PM EDT by a video enabled telemedicine application and verified that I am speaking with the correct person using two identifiers.  Location: Patient: Briana Zhang Provider: remote office in [place]   I discussed the limitations of evaluation and management by telemedicine and the availability of in person appointments. The patient expressed understanding and agreed to proceed.  I discussed  the assessment and treatment plan with the patient. The patient was provided an opportunity to ask questions and all were answered. The patient agreed with the plan and demonstrated an understanding of the instructions.   The patient was advised to call back or seek an in-person evaluation if the symptoms worsen or if the condition fails to improve as anticipated.  I provided 15 minutes dedicated to the care of this patient via video on the date of this encounter to include chart review, face-to-face time with the patient, medication management/counseling.  Jayjay Littles A Rodman Recupero 03/10/2024, 2:40 PM

## 2024-03-10 ENCOUNTER — Telehealth (INDEPENDENT_AMBULATORY_CARE_PROVIDER_SITE_OTHER): Payer: Medicaid Other | Admitting: Psychiatry

## 2024-03-10 ENCOUNTER — Encounter (HOSPITAL_COMMUNITY): Payer: Self-pay | Admitting: Psychiatry

## 2024-03-10 DIAGNOSIS — F1421 Cocaine dependence, in remission: Secondary | ICD-10-CM | POA: Diagnosis not present

## 2024-03-10 DIAGNOSIS — F1021 Alcohol dependence, in remission: Secondary | ICD-10-CM

## 2024-03-10 DIAGNOSIS — F1994 Other psychoactive substance use, unspecified with psychoactive substance-induced mood disorder: Secondary | ICD-10-CM | POA: Diagnosis not present

## 2024-03-10 DIAGNOSIS — F1191 Opioid use, unspecified, in remission: Secondary | ICD-10-CM

## 2024-03-10 MED ORDER — GABAPENTIN 600 MG PO TABS: 600.0000 mg | ORAL_TABLET | Freq: Two times a day (BID) | ORAL | 3 refills | Status: DC

## 2024-03-10 MED ORDER — FLUOXETINE HCL 20 MG PO CAPS: 20.0000 mg | ORAL_CAPSULE | Freq: Every day | ORAL | 3 refills | Status: DC

## 2024-03-10 NOTE — Patient Instructions (Signed)

## 2024-06-10 NOTE — Progress Notes (Signed)
 BH MD Outpatient Progress Note  06/11/2024 3:38 PM Briana Zhang  MRN:  [id number]  Assessment:  Briana Zhang presents for follow-up evaluation. Today, 06/11/24, patient reports continued psychiatric stability and denies signs/sx of mood disturbance or anxiety. She has tolerated transition to new job well. She continues to remain abstinent of all substances and denies any urges/cravings to return to use. No changes to plan of care at this time.  RTC in 3 months by video.  Identifying Information: Briana Zhang is a 43 y.o. female with history of SIMD in remission, alcohol and polysubstance use in remission, and hepatitis C s/p treatment with undetectable RNA who is an established patient with [facility] participating in follow-up via video conferencing. Patient had previously been diagnosed with bipolar 1 disorder however on further exploration patient denied history of hypomania/mania outside of periods of active substance use/withdrawal. Given remission of symptoms upon cessation of substances and stability on SSRI, it is felt likely that previous symptoms attributed to bipolar are better explained by substance use.   Plan:  # Substance induced mood disorder now in remission  Anxiety Status of problem: stable Interventions: -- Continue Prozac  20 mg daily -- Continue Gabapentin  600 mg BID  # Alcohol use disorder in sustained remission  Opioid use disorder in sustained remission  Cocaine  use disorder in sustained remission Status of problem: sustained remission Interventions: -- Continue to support patient in maintaining sobriety -- Patient regularly attends NA meetings  Patient was given contact information for behavioral health clinic and was instructed to call 911 for emergencies.   Subjective:  Chief Complaint:  Chief Complaint  Patient presents with   Medication Management    Interval History:   [name] reports she is doing good and started  new job full time about 3 weeks ago. Transition has gone well. Continues to take medications as prescribed and denies adverse effects. Denies persistent periods of depression or irritability; denies unmanageable anxiety. Sleeping well with about 6-8 hours nightly. Appetite is stable. Denies passive/active SI.   Endorses continued remission from substance/etoh use and denies urges/cravings to return to use.  Denies any questions/concerns and amenable to continuing medications as prescribed.  Visit Diagnosis:    ICD-10-CM   1. Substance induced mood disorder (HCC)  F19.94     2. Opioid use disorder in remission  F11.91     3. Cocaine  dependence in remission (HCC)  F14.21     4. Alcohol dependence, in remission (HCC)  F10.21      Past Psychiatric History:  Diagnoses: SIMD, polysubstance use (alcohol, opioids, methadone , cocaine ) now in remission Hospitalizations:  x1 in 2006 Suicide attempts: denies Substance use:   -- Denies current use of alcohol, tobacco, or illicit drugs  -- Last use of any substances (including crack, heroin) 08/24/2020  Past Medical History:  Past Medical History:  Diagnosis Date   Anxiety    Cellulitis and abscess    Methadone  dependence (HCC) 05/27/2014   Hx of heroine abuse - on methadone  80mg  daily.   [  ] Refer to NICU for discussion regarding neonatal withdrawal   Substance abuse (HCC)    IVDU mulitple substances    Past Surgical History:  Procedure Laterality Date   CESAREAN SECTION     CESAREAN SECTION N/A 06/18/2014   Procedure: CESAREAN SECTION;  Surgeon: Tresia Fruit, MD;  Location: [facility];  Service: Obstetrics;  Laterality: N/A;   WISDOM TOOTH EXTRACTION      Family Psychiatric History:  Mother: depression  Family History:  Family History  Problem Relation Age of Onset   Cancer Mother    Depression Mother    Diabetes Father    Cancer Maternal Grandmother    Cancer Maternal Grandfather     Social History:  Employment: just  started new full time job Social History   Socioeconomic History   Marital status: Single    Spouse name: Not on file   Number of children: 3   Years of education: Not on file   Highest education level: GED or equivalent  Occupational History   Occupation: Financial planner   Tobacco Use   Smoking status: Former    Types: E-cigarettes, Cigarettes   Smokeless tobacco: Current  Vaping Use   Vaping status: Every Day  Substance and Sexual Activity   Alcohol use: No   Drug use: Not Currently    Comment: Last use of substances (heroin, cocaine , others) 08/24/2020   Sexual activity: Yes    Birth control/protection: I.U.D.  Other Topics Concern   Not on file  Social History Narrative   Not on file   Social Drivers of Health   Financial Resource Strain: Low Risk  (05/05/2024)   Received from [facility]   Overall Financial Resource Strain (CARDIA)    Difficulty of Paying Living Expenses: Not hard at all  Food Insecurity: No Food Insecurity (05/05/2024)   Received [facility]   Hunger Vital Sign    Within the past 12 months, you worried that your food would run out before you got the money to buy more.: Never true    Within the past 12 months, the food you bought just didn't last and you didn't have money to get more.: Never true  Transportation Needs: No Transportation Needs (05/05/2024)   Received from [facility] - Transportation    Lack of Transportation (Medical): No    Lack of Transportation (Non-Medical): No  Physical Activity: Insufficiently Active (05/05/2024)   Received from [facility]   Exercise Vital Sign    On average, how many days per week do you engage in moderate to strenuous exercise (like a brisk walk)?: 2 days    On average, how many minutes do you engage in exercise at this level?: 30 min  Stress: No Stress Concern Present (05/05/2024)   Received from [facility] - Occupational Stress  Questionnaire    Feeling of Stress : Not at all  Social Connections: Socially Integrated (05/05/2024)   Received from [facility]   Social Network    How would you rate your social network (family, work, friends)?: Good participation with social networks    Allergies:  Allergies  Allergen Reactions   Ciprofloxacin Swelling and Rash    Current Medications: Current Outpatient Medications  Medication Sig Dispense Refill   FLUoxetine  (PROZAC ) 20 MG capsule Take 1 capsule (20 mg total) by mouth daily. 30 capsule 3   gabapentin  (NEURONTIN ) 600 MG tablet Take 1 tablet (600 mg total) by mouth 2 (two) times daily. 60 tablet 3   ondansetron  (ZOFRAN -ODT) 4 MG disintegrating tablet Take 4 mg by mouth daily as needed for nausea.     valACYclovir  (VALTREX ) 1000 MG tablet Take 1,000 mg by mouth 2 (two) times daily.     No current facility-administered medications for this visit.    ROS: Denies any physical complaints  Objective:  Psychiatric Specialty Exam: There were no vitals taken for this visit.There is  no height or weight on file to calculate BMI.  General Appearance: Casual and Well Groomed  Eye Contact:  Good  Speech:  Clear and Coherent and Normal Rate  Volume:  Normal  Mood:  good  Affect:  Congruent and Euthymic; constricted per baseline  Thought Content: Denies AVH; IOR   Suicidal Thoughts:  No  Homicidal Thoughts:  No  Thought Process:  Goal Directed and Linear  Orientation:  Full (Time, Place, and Person)    Memory:  Grossly intact  Judgment:  Good  Insight:  Good  Concentration:  Concentration: Good  Recall:  not formally assessed  Fund of Knowledge: Good  Language: Good  Psychomotor Activity:  Normal  Akathisia:  NA  AIMS (if indicated): not done  Assets:  Communication Skills Desire for Improvement Housing Leisure Time Physical Health Resilience Social Support Transportation Vocational/Educational  ADL's:  Intact  Cognition: WNL  Sleep:  Good    PE: General: sits comfortably in view of camera; no acute distress  Pulm: no increased work of breathing on room air  MSK: all extremity movements appear intact  Neuro: no focal neurological deficits observed  Gait & Station: unable to assess by video    Metabolic Disorder Labs: No results found for: HGBA1C, MPG No results found for: PROLACTIN No results found for: CHOL, TRIG, HDL, CHOLHDL, VLDL, LDLCALC Lab Results  Component Value Date   TSH 0.739 05/07/2014    Therapeutic Level Labs: No results found for: LITHIUM No results found for: VALPROATE No results found for: CBMZ  Screenings:  GAD-7    Flowsheet Row Video Visit from 07/28/2022 in [facility] Video Visit from 01/20/2022 in [facility] Video Visit from 10/28/2021 in [facility] Video Visit [facility]  Total GAD-7 Score 0 0 4 0   PHQ2-9    Flowsheet Row Video Visit from 07/28/2022 in [facility] Video Visit from 01/20/2022 in [facility] Video Visit from 10/28/2021 in [facility] Video Visit from 07/29/2021 in [facility] Office Visit from 10/23/2018 in [facility] [place]  PHQ-2 Total Score 0 0 0 0 0  PHQ-9 Total Score 1 1 0 -- --    Collaboration of Care: Collaboration of Care: Medication Management AEB ongoing medication management and Psychiatrist AEB established with this provider  Patient/Guardian was advised Release of Information must be obtained prior to any record release in order to collaborate their care with an outside provider. Patient/Guardian was advised if they have not already done so to contact the registration department to sign all necessary forms in order for us  to release information  regarding their care.   Consent: Patient/Guardian gives verbal consent for treatment and assignment of benefits for services provided during this visit. Patient/Guardian expressed understanding and agreed to proceed.   Televisit via video: I connected wit patient on 06/11/24 at  3:30 PM EDT by a video enabled telemedicine application and verified that I am speaking with the correct person using two identifiers.  Location: Patient: private location in [place] Provider: remote office in [place]   I discussed the limitations of evaluation and management by telemedicine and the availability of in person appointments. The patient expressed understanding and agreed to proceed.  I discussed the assessment and treatment plan with the patient. The patient was provided  an opportunity to ask questions and all were answered. The patient agreed with the plan and demonstrated an understanding of the instructions.   The patient was advised to call back or seek an in-person evaluation if the symptoms worsen or if the condition fails to improve as anticipated.  I provided 15 minutes dedicated to the care of this patient via video on the date of this encounter to include chart review, face-to-face time with the patient, medication management/counseling.  Roshan Roback A Yamira Papa 06/11/2024, 3:38 PM

## 2024-06-11 ENCOUNTER — Telehealth (INDEPENDENT_AMBULATORY_CARE_PROVIDER_SITE_OTHER): Admitting: Psychiatry

## 2024-06-11 ENCOUNTER — Encounter (HOSPITAL_COMMUNITY): Payer: Self-pay | Admitting: Psychiatry

## 2024-06-11 DIAGNOSIS — F1421 Cocaine dependence, in remission: Secondary | ICD-10-CM

## 2024-06-11 DIAGNOSIS — F1994 Other psychoactive substance use, unspecified with psychoactive substance-induced mood disorder: Secondary | ICD-10-CM | POA: Diagnosis not present

## 2024-06-11 DIAGNOSIS — F1021 Alcohol dependence, in remission: Secondary | ICD-10-CM

## 2024-06-11 DIAGNOSIS — F1191 Opioid use, unspecified, in remission: Secondary | ICD-10-CM

## 2024-06-11 MED ORDER — GABAPENTIN 600 MG PO TABS
600.0000 mg | ORAL_TABLET | Freq: Two times a day (BID) | ORAL | 3 refills | Status: DC
Start: 2024-06-11 — End: 2024-09-23

## 2024-06-11 MED ORDER — FLUOXETINE HCL 20 MG PO CAPS
20.0000 mg | ORAL_CAPSULE | Freq: Every day | ORAL | 3 refills | Status: DC
Start: 2024-06-11 — End: 2024-09-23

## 2024-06-11 NOTE — Patient Instructions (Signed)

## 2024-07-18 ENCOUNTER — Encounter (HOSPITAL_BASED_OUTPATIENT_CLINIC_OR_DEPARTMENT_OTHER): Payer: Self-pay | Admitting: Orthopedic Surgery

## 2024-07-25 ENCOUNTER — Ambulatory Visit (HOSPITAL_BASED_OUTPATIENT_CLINIC_OR_DEPARTMENT_OTHER)
Admission: RE | Admit: 2024-07-25 | Discharge: 2024-07-25 | Disposition: A | Discharge status: Home / Self Care | Attending: Orthopedic Surgery | Admitting: Orthopedic Surgery

## 2024-07-25 ENCOUNTER — Ambulatory Visit (HOSPITAL_BASED_OUTPATIENT_CLINIC_OR_DEPARTMENT_OTHER): Admitting: Certified Registered Nurse Anesthetist

## 2024-07-25 ENCOUNTER — Other Ambulatory Visit: Payer: Self-pay

## 2024-07-25 ENCOUNTER — Encounter (HOSPITAL_BASED_OUTPATIENT_CLINIC_OR_DEPARTMENT_OTHER)
Admission: RE | Disposition: A | Discharge status: Home / Self Care | Payer: Self-pay | Source: Home / Self Care | Attending: Orthopedic Surgery

## 2024-07-25 ENCOUNTER — Encounter (HOSPITAL_BASED_OUTPATIENT_CLINIC_OR_DEPARTMENT_OTHER): Payer: Self-pay | Admitting: Orthopedic Surgery

## 2024-07-25 DIAGNOSIS — F17290 Nicotine dependence, other tobacco product, uncomplicated: Secondary | ICD-10-CM | POA: Diagnosis not present

## 2024-07-25 DIAGNOSIS — X58XXXA Exposure to other specified factors, initial encounter: Secondary | ICD-10-CM | POA: Insufficient documentation

## 2024-07-25 DIAGNOSIS — F1120 Opioid dependence, uncomplicated: Secondary | ICD-10-CM | POA: Insufficient documentation

## 2024-07-25 DIAGNOSIS — Z419 Encounter for procedure for purposes other than remedying health state, unspecified: Secondary | ICD-10-CM

## 2024-07-25 DIAGNOSIS — S83211A Bucket-handle tear of medial meniscus, current injury, right knee, initial encounter: Secondary | ICD-10-CM

## 2024-07-25 DIAGNOSIS — M25561 Pain in right knee: Secondary | ICD-10-CM | POA: Diagnosis present

## 2024-07-25 DIAGNOSIS — S83511A Sprain of anterior cruciate ligament of right knee, initial encounter: Secondary | ICD-10-CM

## 2024-07-25 HISTORY — PX: KNEE ARTHROSCOPY WITH MEDIAL MENISECTOMY: SHX5651

## 2024-07-25 HISTORY — PX: KNEE ARTHROSCOPY WITH ANTERIOR CRUCIATE LIGAMENT (ACL) REPAIR: SHX5644

## 2024-07-25 LAB — POCT PREGNANCY, URINE: Preg Test, Ur: NEGATIVE

## 2024-07-25 SURGERY — ARTHROSCOPY, KNEE, WITH MEDIAL MENISCECTOMY
Anesthesia: General | Site: Knee | Laterality: Right

## 2024-07-25 MED ORDER — SCOPOLAMINE 1 MG/3DAYS TD PT72
MEDICATED_PATCH | TRANSDERMAL | Status: AC
  Filled 2024-07-25: qty 1

## 2024-07-25 MED ORDER — OXYCODONE HCL 5 MG PO TABS: 5.0000 mg | ORAL_TABLET | ORAL | 0 refills | Status: DC | PRN

## 2024-07-25 MED ORDER — CEFAZOLIN SODIUM-DEXTROSE 2-4 GM/100ML-% IV SOLN
INTRAVENOUS | Status: AC
  Filled 2024-07-25: qty 100

## 2024-07-25 MED ORDER — EPINEPHRINE PF 1 MG/ML IJ SOLN
INTRAMUSCULAR | Status: AC
  Filled 2024-07-25: qty 1

## 2024-07-25 MED ORDER — MIDAZOLAM HCL 2 MG/2ML IJ SOLN
2.0000 mg | Freq: Once | INTRAMUSCULAR | Status: AC
  Administered 2024-07-25: 2 mg via INTRAVENOUS

## 2024-07-25 MED ORDER — HYDROMORPHONE HCL 1 MG/ML IJ SOLN
INTRAMUSCULAR | Status: AC
  Filled 2024-07-25: qty 0.5

## 2024-07-25 MED ORDER — SODIUM CHLORIDE 0.9 % IR SOLN
Status: DC | PRN
  Administered 2024-07-25: 3000 mL

## 2024-07-25 MED ORDER — CLONIDINE HCL (ANALGESIA) 100 MCG/ML EP SOLN
EPIDURAL | Status: DC | PRN
Start: 2024-07-25 — End: 2024-07-25
  Administered 2024-07-25: 100 ug

## 2024-07-25 MED ORDER — ONDANSETRON HCL 4 MG/2ML IJ SOLN
INTRAMUSCULAR | Status: AC
  Filled 2024-07-25: qty 2

## 2024-07-25 MED ORDER — DEXMEDETOMIDINE HCL IN NACL 80 MCG/20ML IV SOLN
INTRAVENOUS | Status: AC
Start: 2024-07-25 — End: 2024-07-25
  Filled 2024-07-25: qty 20

## 2024-07-25 MED ORDER — LACTATED RINGERS IV SOLN: INTRAVENOUS | Status: DC

## 2024-07-25 MED ORDER — LACTATED RINGERS IV SOLN
INTRAVENOUS | Status: DC | PRN
Start: 2024-07-25 — End: 2024-07-25

## 2024-07-25 MED ORDER — HYDROMORPHONE HCL 1 MG/ML IJ SOLN
INTRAMUSCULAR | Status: AC
Start: 2024-07-25 — End: 2024-07-25
  Filled 2024-07-25: qty 0.5

## 2024-07-25 MED ORDER — SCOPOLAMINE 1 MG/3DAYS TD PT72SCOPOLAMINE 1 MG/3DAYS
1.0000 | MEDICATED_PATCH | Freq: Once | TRANSDERMAL | Status: DC
Start: 2024-07-25 — End: 2024-07-25
  Administered 2024-07-25: 1.5 mg via TRANSDERMAL

## 2024-07-25 MED ORDER — DEXAMETHASONE SODIUM PHOSPHATE 4 MG/ML IJ SOLN
INTRAMUSCULAR | Status: DC | PRN
  Administered 2024-07-25: 5 mg via INTRAVENOUS

## 2024-07-25 MED ORDER — KETOROLAC TROMETHAMINE 30 MG/ML IJ SOLN
INTRAMUSCULAR | Status: DC | PRN
  Administered 2024-07-25: 30 mg via INTRAVENOUS

## 2024-07-25 MED ORDER — BUPIVACAINE-EPINEPHRINE (PF) 0.5% -1:200000 IJ SOLN
INTRAMUSCULAR | Status: DC | PRN
  Administered 2024-07-25: 15 mL via PERINEURAL

## 2024-07-25 MED ORDER — FENTANYL CITRATE (PF) 100 MCG/2ML IJ SOLN
INTRAMUSCULAR | Status: AC
  Filled 2024-07-25: qty 2

## 2024-07-25 MED ORDER — DEXAMETHASONE SODIUM PHOSPHATE 10 MG/ML IJ SOLN
INTRAMUSCULAR | Status: AC
  Filled 2024-07-25: qty 1

## 2024-07-25 MED ORDER — LIDOCAINE HCL (CARDIAC) PF 100 MG/5ML IV SOSY
PREFILLED_SYRINGE | INTRAVENOUS | Status: DC | PRN
  Administered 2024-07-25: 50 mg via INTRAVENOUS

## 2024-07-25 MED ORDER — SODIUM CHLORIDE 0.9 % IR SOLN
Status: DC | PRN
  Administered 2024-07-25: 6000 mL

## 2024-07-25 MED ORDER — HYDROMORPHONE HCL 1 MG/ML IJ SOLN
0.2500 mg | INTRAMUSCULAR | Status: DC | PRN
  Administered 2024-07-25 (×2): 0.5 mg via INTRAVENOUS

## 2024-07-25 MED ORDER — ACETAMINOPHEN 500 MG PO TABS
ORAL_TABLET | ORAL | Status: AC
  Filled 2024-07-25: qty 2

## 2024-07-25 MED ORDER — MIDAZOLAM HCL 2 MG/2ML IJ SOLN
INTRAMUSCULAR | Status: AC
  Filled 2024-07-25: qty 2

## 2024-07-25 MED ORDER — ONDANSETRON 4 MG PO TBDP: 4.0000 mg | ORAL_TABLET | Freq: Three times a day (TID) | ORAL | 0 refills | Status: DC | PRN

## 2024-07-25 MED ORDER — ACETAMINOPHEN 500 MG PO TABS
1000.0000 mg | ORAL_TABLET | Freq: Once | ORAL | Status: AC
  Administered 2024-07-25: 1000 mg via ORAL

## 2024-07-25 MED ORDER — PROPOFOL 10 MG/ML IV BOLUS
INTRAVENOUS | Status: AC
  Filled 2024-07-25: qty 20

## 2024-07-25 MED ORDER — DROPERIDOL 2.5 MG/ML IJ SOLN: 0.6250 mg | Freq: Once | INTRAMUSCULAR | Status: DC | PRN

## 2024-07-25 MED ORDER — LIDOCAINE 2% (20 MG/ML) 5 ML SYRINGE
INTRAMUSCULAR | Status: AC
  Filled 2024-07-25: qty 5

## 2024-07-25 MED ORDER — PROPOFOL 10 MG/ML IV BOLUS
INTRAVENOUS | Status: DC | PRN
  Administered 2024-07-25: 110 mg via INTRAVENOUS

## 2024-07-25 MED ORDER — CEFAZOLIN SODIUM-DEXTROSE 2-4 GM/100ML-% IV SOLN
2.0000 g | INTRAVENOUS | Status: AC
  Administered 2024-07-25: 2 g via INTRAVENOUS

## 2024-07-25 MED ORDER — KETOROLAC TROMETHAMINE 30 MG/ML IJ SOLN
INTRAMUSCULAR | Status: AC
  Filled 2024-07-25: qty 1

## 2024-07-25 MED ORDER — EPHEDRINE SULFATE (PRESSORS) 50 MG/ML IJ SOLN
INTRAMUSCULAR | Status: DC | PRN
  Administered 2024-07-25: 10 mg via INTRAVENOUS

## 2024-07-25 MED ORDER — FENTANYL CITRATE (PF) 100 MCG/2ML IJ SOLN
INTRAMUSCULAR | Status: DC | PRN
  Administered 2024-07-25 (×2): 50 ug via INTRAVENOUS

## 2024-07-25 MED ORDER — OXYCODONE HCL 5 MG PO TABS
5.0000 mg | ORAL_TABLET | Freq: Once | ORAL | Status: AC
  Administered 2024-07-25: 5 mg via ORAL

## 2024-07-25 MED ORDER — FENTANYL CITRATE (PF) 100 MCG/2ML IJ SOLN
100.0000 ug | Freq: Once | INTRAMUSCULAR | Status: AC
  Administered 2024-07-25: 50 ug via INTRAVENOUS

## 2024-07-25 MED ORDER — ONDANSETRON HCL 4 MG/2ML IJ SOLN
INTRAMUSCULAR | Status: DC | PRN
  Administered 2024-07-25: 4 mg via INTRAVENOUS

## 2024-07-25 MED ORDER — OXYCODONE HCL 5 MG PO TABS
ORAL_TABLET | ORAL | Status: AC
Start: 2024-07-25 — End: 2024-07-25
  Filled 2024-07-25: qty 1

## 2024-07-25 SURGICAL SUPPLY — 61 items
ANCHOR BUTTON TIGHTROPE 14 (Anchor) IMPLANT
BLADE SHAVER TORPEDO 4X13 (MISCELLANEOUS) ×1 IMPLANT
BLADE SURG 15 STRL LF DISP TIS (BLADE) ×2 IMPLANT
BNDG ELASTIC 6INX 5YD STR LF (GAUZE/BANDAGES/DRESSINGS) ×1 IMPLANT
BURR OVAL 8 FLU 4.0X13 (MISCELLANEOUS) ×1 IMPLANT
BURR OVAL 8 FLU 5.0X13 (MISCELLANEOUS) IMPLANT
CLSR STERI-STRIP ANTIMIC 1/2X4 (GAUZE/BANDAGES/DRESSINGS) ×1 IMPLANT
COOLER ICEMAN CLASSIC (MISCELLANEOUS) ×1 IMPLANT
COVER BACK TABLE 60X90IN (DRAPES) ×1 IMPLANT
CUFF TRNQT CYL 34X4.125X (TOURNIQUET CUFF) ×1 IMPLANT
CUTTER BONE 4.0MM X 13CM (MISCELLANEOUS) ×1 IMPLANT
DRAPE INCISE IOBAN 66X45 STRL (DRAPES) IMPLANT
DRAPE U-SHAPE 47X51 STRL (DRAPES) ×1 IMPLANT
DRAPE-T ARTHROSCOPY W/POUCH (DRAPES) ×1 IMPLANT
DRILL FLIPCUTTER III 6-12 (ORTHOPEDIC DISPOSABLE SUPPLIES) IMPLANT
DURAPREP 26ML APPLICATOR (WOUND CARE) ×1 IMPLANT
ELECTRODE REM PT RTRN 9FT ADLT (ELECTROSURGICAL) ×1 IMPLANT
FIBERSTICK 2 (SUTURE) IMPLANT
GAUZE PAD ABD 8X10 STRL (GAUZE/BANDAGES/DRESSINGS) ×1 IMPLANT
GAUZE SPONGE 4X4 12PLY STRL (GAUZE/BANDAGES/DRESSINGS) ×1 IMPLANT
GAUZE XEROFORM 1X8 LF (GAUZE/BANDAGES/DRESSINGS) ×1 IMPLANT
GEL BONE GRAFT DBM ALLOSYNC 5 (Bone Implant) IMPLANT
GLOVE BIO SURGEON STRL SZ7.5 (GLOVE) ×2 IMPLANT
GLOVE BIOGEL PI IND STRL 8 (GLOVE) ×2 IMPLANT
GOWN STRL REUS W/ TWL LRG LVL3 (GOWN DISPOSABLE) ×1 IMPLANT
GOWN STRL REUS W/ TWL XL LVL3 (GOWN DISPOSABLE) ×1 IMPLANT
GOWN STRL REUS W/TWL XL LVL3 (GOWN DISPOSABLE) ×2 IMPLANT
GRAFT TISS 60-80 FRZN TENDON (Tissue) IMPLANT
IMPL SYS 2ND FX PEEK 4.75X19.1 (Miscellaneous) IMPLANT
KIT BIOCARTILAGE LG JOINT MIX (KITS) IMPLANT
KNIFE GRAFT ACL 10MM 5952 (MISCELLANEOUS) IMPLANT
KNIFE GRAFT ACL 9MM (MISCELLANEOUS) IMPLANT
MANIFOLD NEPTUNE II (INSTRUMENTS) ×1 IMPLANT
PACK ARTHROSCOPY DSU (CUSTOM PROCEDURE TRAY) ×1 IMPLANT
PACK BASIN DAY SURGERY FS (CUSTOM PROCEDURE TRAY) ×1 IMPLANT
PAD COLD SHLDR WRAP-ON (PAD) ×1 IMPLANT
PADDING CAST COTTON 6X4 STRL (CAST SUPPLIES) IMPLANT
PENCIL SMOKE EVACUATOR (MISCELLANEOUS) IMPLANT
SHEET MEDIUM DRAPE 40X70 STRL (DRAPES) ×1 IMPLANT
SLEEVE SCD COMPRESS KNEE MED (STOCKING) ×1 IMPLANT
SPONGE T-LAP 4X18 ~~LOC~~+RFID (SPONGE) ×1 IMPLANT
STRIP CLOSURE SKIN 1/2X4 (GAUZE/BANDAGES/DRESSINGS) IMPLANT
SUCTION TUBE FRAZIER 10FR DISP (SUCTIONS) ×1 IMPLANT
SUT ETHILON 4 0 PS 2 18 (SUTURE) IMPLANT
SUT MNCRL AB 3-0 PS2 18 (SUTURE) ×1 IMPLANT
SUT MON AB 2-0 CT1 36 (SUTURE) ×1 IMPLANT
SUT VIC AB 0 CT1 27XBRD ANBCTR (SUTURE) IMPLANT
SUT VIC AB 1 CT1 27XBRD ANBCTR (SUTURE) IMPLANT
SUT VIC AB 2-0 CT1 TAPERPNT 27 (SUTURE) ×1 IMPLANT
SUT VICRYL 0 UR6 27IN ABS (SUTURE) IMPLANT
SUTURE FIBERWR #2 38 T-5 BLUE (SUTURE) IMPLANT
SUTURE FIBERWR#2 38 REV NDL BL (SUTURE) IMPLANT
SUTURE TAPE 1.3 FIBERLOP 20 ST (SUTURE) IMPLANT
SUTURE TAPE TIGERLINK 1.3MM BL (SUTURE) IMPLANT
SYSTEM ALLOGRAFT GRAFTLINK CP2 (Anchor) ×2 IMPLANT
TAPE LABRALWHITE 1.5X36 (TAPE) IMPLANT
TOWEL GREEN STERILE FF (TOWEL DISPOSABLE) ×2 IMPLANT
TUBE CONNECTING 20X1/4 (TUBING) IMPLANT
TUBE SUCTION HIGH CAP CLEAR NV (SUCTIONS) ×1 IMPLANT
TUBING ARTHROSCOPY IRRIG 16FT (MISCELLANEOUS) ×1 IMPLANT
WAND ABLATOR APOLLO I90 (BUR) IMPLANT

## 2024-07-25 NOTE — Op Note (Addendum)
 Surgery Date: 07/25/2024    Surgeon(s): Sharl Briana Dover, MD   ASSIST: Dayle Moores, PA-C  Assistant attestation:  PA Mcclung scrubbed and present for the entire procedure.   Implants:  Arthrex all inside cortical buttons on femur and tibia 4.75 PEEK swivel lock x 1. Hamstring FDL allograft for ACL reconstruction  ANESTHESIA:  general, and adductor block   IV FLUIDS AND URINE: See anesthesia.   TOURNIQUET: 45 minutes   DRAINS: none   COMPLICATIONS: None.     ESTIMATED BLOOD LOSS: minimal   PREOPERATIVE DIAGNOSES:  1.  Right knee bucket-handle medial meniscus tear 2.  Right knee complete ACL rupture   POSTOPERATIVE DIAGNOSES:  same   PROCEDURES PERFORMED:  1.  Right knee arthroscopy with Hamstring allograft ACL reconstruction 2.  Right knee arthroscopic partial medial meniscectomy   DESCRIPTION OF PROCEDURE:  Briana Zhang is a 43 year old female with right knee bucket-handle medial meniscus tear, Complete ACL rupture.  They sustained these injuries about 2 years prior to coming to the operating room today.  We discussed proceeding with arthroscopically assisted hamstring autograft ACL reconstruction and medial meniscectomy versus repair.  We reviewed the risks benefits and indications of this procedure including but not limited to bleeding, infection, damage to neurovascular structures, need for future surgery, developed an of arthrosis, rupture of graft, continued instability of the knee, and developement of blood clots and risk of anesthesia.  All questions answered.   The patient was identified in the preoperative holding area and the operative extremity was marked. The patient was brought to the operating room and transferred to operating table in a supine position. Satisfactory general anesthesia was induced by anesthesiology.     Examination under anesthesia revealed a grade 2B Lachman, grade 2 pivot shift, and stable to varus and valgus stress.      At  the back table, we prepared the graft by the manufacturer's recommendations with a Endobutton on the femoral side and a adjustable loop on the tibial side to be passed over a button once pulled through the tibial tunnel.  The graft was pretensioned on the back table and wrapped in a saline-soaked gauze.  The graft measured 67 mm in total length, 8.5 mm on femoral tunnel diameter, and 8.5 mm diameter on the tibial tunnel.   Standard anterolateral, anteromedial arthroscopy portals were obtained. The anteromedial portal was obtained with a spinal needle for localization under direct visualization with subsequent diagnostic findings.    Anteromedial and anterolateral chambers: mild synovitis. The synovitis was debrided with a 4.5 mm full radius shaver through both the anteromedial and lateral portals.    Suprapatellar pouch and gutters: no synovitis or debris. Patella chondral surface: Grade 0 Trochlear chondral surface: Grade 0 Patellofemoral tracking: Midline, no tilt Medial meniscus: Chronic appearing bucket-handle tear of the posterior horn and mid body flipped into the intercondylar notch with no chondromalacia appreciated adjacent to this.  However, significant degenerative changes within the flipped fragment Medial femoral condyle flexion bearing surface: Grade 0 Medial femoral condyle extension bearing surface: Grade 0 Medial tibial plateau: Grade 0 Anterior cruciate ligament:Complete mid substance tear Posterior cruciate ligament:stable Lateral meniscus: Intact, no tear..   Lateral femoral condyle flexion bearing surface: Grade 0 Lateral femoral condyle extension bearing surface: Grade 0 Lateral tibial plateau: Grade 1   Next, We turned our attention to the medial meniscus tear.  We performed a medial meniscectomy of the bucket-handle fragment with combination of meniscal biter and motorized shaver.  After completion of  the resection we had resected approximately 35 to 40% of the total  surface area.     Next, the ACL reconstruction was undertaken. The ACL stump was removed with thermal ablation and shaver and anatomic bony landmarks were marked for the placement of the femoral and tibial sockets.  Arthrex retroguides and Flipcutters were used to create the sockets and perform the procedure by an all-inside GraftLink technique.  The femoral socket was created at the inferior portion of the bifurcate ridge of the lateral femoral wall with a size 8.5 mm FlipCutter to a depth of  15 mm while the tibial socket was created at the center of the ACL footprint from front to back and toward the base of the medial tibial eminence from medial to lateral, to a depth of 23-25 mm with a 8.5 mm FlipCutter. Bony debris was removed and the edges of socket apertures were smoothed. Suture shuttles were used to deliver the graft into the femoral socket first and the tibial socket second. The graft was then secured within the sockets, cinching the self-locking sutures overtop of the proximal and distal cortical buttons with the knee in a reduced position maintained at 20 degrees flexion while a moderate force posterior drawer was applied.  After this preliminary tensioning, the knee was placed through several flexion-extension cycles to eliminate any graft settling or excursion and the graft was re-tensioned in the same manner and the sutures were tied over top of the buttons proximally and distally, and the four tibial sided suture arms were secondarily secured at the proximal tibia with 1 SwiveLock anchor. Of note we did also pass a free labral tape through the ACL fixation as an separate internal brace backup fixation.   Final images of the ACL graft were obtained, revealing no lateral wall or roof impingement of the graft at the notch through range of motion.  Stability of the ACL graft was assessed and found to be normalized at grade 0 lachman and grade 0 pivot shift.    The wounds were all closed in layers  per usual.  Dressings were applied and a brace placed And locked in 0 of flexion..  There were no apparent complications.  The patient was awakened and taken to recovery room in satisfactory condition.   POSTOPERATIVE PLAN:  Briana Zhang will be touch down weight bearing on crutches until cleared by The therapist.  They will likewise be in The knee brace with it locked until quad function is normalized. They will be on 81 mg asa daily for 6 weeks for DVT PPX.  they will return to the clinic to see the surgeon in 2 weeks.   Briana Zhang

## 2024-07-25 NOTE — Progress Notes (Signed)
 Assisted Dr. Stephannie Peters with right, adductor canal, ultrasound guided block. Side rails up, monitors on throughout procedure. See vital signs in flow sheet. Tolerated Procedure well.

## 2024-07-25 NOTE — Discharge Instructions (Addendum)
 DISCHARGE INSTRUCTIONS: ________________________________________________________________________________ ACL RECONSTRUCTION HOME EXERCISE PROGRAM (0-2 WEEKS)   Elevate the leg above your heart as often as possible. Once the nerve block has worn off completely, you may weight bear as tolerated with the Bledsoe brace, use crutches as needed, progress from 2 to 1 crutch as able using one crutch on opposite side of surgical knee.  Do not limp and do not walk too much!! You should sleep in the knee brace with it locked in full extension.  He may remove for showering.  Otherwise he may remove for exercise. Start normal showering on postoperative day #3.  Do not submerge underwater Goals for first two weeks:  minimal swelling, motion 0-90, walking with brace without crutches and positive attitude about PT. Use pain medication as needed.  You may also take Tylenol  and Advil  around-the-clock in alternating fashion in addition to the pain medication.  To prevent constipation use Colace 100mg . twice a day while on pain medication.  If constipated, use Miralax  17 gm once a day and drink plenty of fluids.  These medications can be obtained at the pharmacy without a prescription.   Follow up in the office in 14 days. You may remove your postoperative bandages on the third day from surgery and begin showering.  Do not remove the Steri-Strips.  Do not submerge underwater.  Replace your Ace bandage over your wounds before reapplying your knee brace You should also continue to wear the TED hose for 2 weeks postoperatively. You should also take an 81 mg aspirin once per day x 6 weeks for the prevention of DVT.  No Tylenol  before 12:30p. No anti-inflammatories (ibuprofen /Aleve ) before 2:30p. No oxycodone  before 2:15p.  Regional Anesthesia Blocks  1. You may not be able to move or feel the blocked extremity after a regional anesthetic block. This may last may last from 3-48 hours after placement, but it will go  away. The length of time depends on the medication injected and your individual response to the medication. As the nerves start to wake up, you may experience tingling as the movement and feeling returns to your extremity. If the numbness and inability to move your extremity has not gone away after 48 hours, please call your surgeon.   2. The extremity that is blocked will need to be protected until the numbness is gone and the strength has returned. Because you cannot feel it, you will need to take extra care to avoid injury. Because it may be weak, you may have difficulty moving it or using it. You may not know what position it is in without looking at it while the block is in effect.  3. For blocks in the legs and feet, returning to weight bearing and walking needs to be done carefully. You will need to wait until the numbness is entirely gone and the strength has returned. You should be able to move your leg and foot normally before you try and bear weight or walk. You will need someone to be with you when you first try to ensure you do not fall and possibly risk injury.  4. Bruising and tenderness at the needle site are common side effects and will resolve in a few days.  5. Persistent numbness or new problems with movement should be communicated to the surgeon or the [facility] [phone] [facility] [phone]).   Post Anesthesia Home Care Instructions  Activity: Get plenty of rest for the remainder of the day. A responsible individual  must stay with you for 24 hours following the procedure.  For the next 24 hours, DO NOT: -Drive a car -Advertising copywriter -Drink alcoholic beverages -Take any medication unless instructed by your physician -Make any legal decisions or sign important papers.  Meals: Start with liquid foods such as gelatin or soup. Progress to regular foods as tolerated. Avoid greasy, spicy, heavy foods. If nausea and/or vomiting occur,  drink only clear liquids until the nausea and/or vomiting subsides. Call your physician if vomiting continues.  Special Instructions/Symptoms: Your throat may feel dry or sore from the anesthesia or the breathing tube placed in your throat during surgery. If this causes discomfort, gargle with warm salt water. The discomfort should disappear within 24 hours.  If you had a scopolamine  patch placed behind your ear for the management of post- operative nausea and/or vomiting:  1. The medication in the patch is effective for 72 hours, after which it should be removed.  Wrap patch in a tissue and discard in the trash. Wash hands thoroughly with soap and water. 2. You may remove the patch earlier than 72 hours if you experience unpleasant side effects which may include dry mouth, dizziness or visual disturbances. 3. Avoid touching the patch. Wash your hands with soap and water after contact with the patch.

## 2024-07-25 NOTE — Transfer of Care (Signed)
 Immediate Anesthesia Transfer of Care Note  Patient: Briana Zhang  Procedure(s) Performed: ARTHROSCOPY, KNEE, WITH MEDIAL MENISCECTOMY (Right: Knee) KNEE ARTHROSCOPY WITH ANTERIOR CRUCIATE LIGAMENT (ACL)  RECONSTRUCTION (Right: Knee)  Patient Location: PACU  Anesthesia Type:General  Level of Consciousness: awake, alert , and oriented  Airway & Oxygen Therapy: Patient Spontanous Breathing and Patient connected to face mask oxygen  Post-op Assessment: Report given to RN and Post -op Vital signs reviewed and stable  Post vital signs: Reviewed and stable  Last Vitals:  Vitals Value Taken Time  BP 126/71 07/25/24 08:55  Temp    Pulse 88 07/25/24 08:57  Resp 16 07/25/24 08:57  SpO2 99 % 07/25/24 08:57  Vitals shown include unfiled device data.  Last Pain:  Vitals:   07/25/24 0630  TempSrc: Tympanic  PainSc: 0-No pain      Patients Stated Pain Goal: 5 (07/25/24 0630)  Complications: No notable events documented.

## 2024-07-25 NOTE — H&P (Signed)
 ORTHOPAEDIC H and P  REQUESTING PHYSICIAN: Sharl Selinda Dover, MD  PCP:  Mavis Redge SAILOR, FNP  Chief Complaint: Right knee acl tear  HPI: Briana Zhang is a 43 y.o. female who complains of right kne pain and instability.  Here today for surgery.  No new complatins.  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   [  ] Refer to NICU for discussion regarding neonatal withdrawal   Substance abuse (HCC)    IVDU mulitple substances   Past Surgical History:  Procedure Laterality Date   CESAREAN SECTION     CESAREAN SECTION N/A 06/18/2014   Procedure: CESAREAN SECTION;  Surgeon: Lynwood KANDICE Solomons, MD;  Location: [facility];  Service: Obstetrics;  Laterality: N/A;   WISDOM TOOTH EXTRACTION     Social History   Socioeconomic History   Marital status: Single    Spouse name: Not on file   Number of children: 3   Years of education: Not on file   Highest education level: GED or equivalent  Occupational History   Occupation: Financial planner   Tobacco Use   Smoking status: Former    Types: E-cigarettes, Cigarettes   Smokeless tobacco: Current  Vaping Use   Vaping status: Every Day  Substance and Sexual Activity   Alcohol use: No   Drug use: Not Currently    Comment: Last use of substances (heroin, cocaine , others) 08/24/2020   Sexual activity: Yes    Birth control/protection: I.U.D.  Other Topics Concern   Not on file  Social History Narrative   Not on file   Social Drivers of Health   Financial Resource Strain: Low Risk  (05/05/2024)   Received from [facility]   Overall Financial Resource Strain (CARDIA)    Difficulty of Paying Living Expenses: Not hard at all  Food Insecurity: No Food Insecurity (05/05/2024)   Received from [facility]   Hunger Vital Sign    Within the past 12 months, you worried that your food would run out before you got the  money to buy more.: Never true    Within the past 12 months, the food you bought just didn't last and you didn't have money to get more.: Never true  Transportation Needs: No Transportation Needs (05/05/2024)   Received from [facility] - Transportation    Lack of Transportation (Medical): No    Lack of Transportation (Non-Medical): No  Physical Activity: Insufficiently Active (05/05/2024)   Received from [facility]   Exercise Vital Sign    On average, how many days per week do you engage in moderate to strenuous exercise (like a brisk walk)?: 2 days    On average, how many minutes do you engage in exercise at this level?: 30 min  Stress: No Stress Concern Present (05/05/2024)   Received from [facility] - Occupational Stress Questionnaire    Feeling of Stress : Not at all  Social Connections: Socially Integrated (05/05/2024)   Received from [facility]   Social Network    How would you rate your social network (family, work, friends)?: Good participation with social networks   Family History  Problem Relation Age of Onset   Cancer Mother    Depression Mother    Diabetes Father    Cancer Maternal Grandmother  Cancer Maternal Grandfather    Allergies  Allergen Reactions   Ciprofloxacin Swelling and Rash   Prior to Admission medications   Medication Sig Start Date End Date Taking? Authorizing Provider  FLUoxetine  (PROZAC ) 20 MG capsule Take 1 capsule (20 mg total) by mouth daily. 06/11/24 10/09/24 Yes Bahraini, Sarah A  gabapentin  (NEURONTIN ) 600 MG tablet Take 1 tablet (600 mg total) by mouth 2 (two) times daily. 06/11/24 10/09/24 Yes Bahraini, Sarah A  ondansetron  (ZOFRAN -ODT) 4 MG disintegrating tablet Take 4 mg by mouth daily as needed for nausea. 02/20/22  Yes [provider]  valACYclovir  (VALTREX ) 1000 MG tablet Take 1,000 mg by mouth 2 (two) times daily.    [provider]   No results  found.  Positive ROS: All other systems have been reviewed and were otherwise negative with the exception of those mentioned in the HPI and as above.  Physical Exam: General: Alert, no acute distress Cardiovascular: No pedal edema Respiratory: No cyanosis, no use of accessory musculature GI: No organomegaly, abdomen is soft and non-tender Skin: No lesions in the area of chief complaint Neurologic: Sensation intact distally Psychiatric: Patient is competent for consent with normal mood and affect Lymphatic: No axillary or cervical lymphadenopathy  MUSCULOSKELETAL: RLE- wwp, nvi  Assessment:  Right knee ACL tear Right knee medial meniscus tear  Plan: - surgery today with allograft recon for acl tear and partial medial meniscectomy  - The risks, benefits, and alternatives were discussed with the patient. There are risks associated with the surgery including, but not limited to, problems with anesthesia (death), infection, differences in leg length/angulation/rotation, fracture of bones, loosening or failure of implants, malunion, nonunion, hematoma (blood accumulation) which may require surgical drainage, blood clots, pulmonary embolism, nerve injury (foot drop), and blood vessel injury. The patient understands these risks and elects to proceed.  - dc home post op    Selinda Belvie Gosling, MD Cell [phone]    07/25/2024 6:39 AM

## 2024-07-25 NOTE — Anesthesia Preprocedure Evaluation (Addendum)
 Anesthesia Evaluation  Patient identified by MRN, date of birth, ID band Patient awake    Reviewed: Allergy & Precautions, NPO status , Patient's Chart, lab work & pertinent test results  History of Anesthesia Complications Negative for: history of anesthetic complications  Airway Mallampati: II  TM Distance: >3 FB Neck ROM: Full    Dental no notable dental hx.    Pulmonary former smoker   Pulmonary exam normal        Cardiovascular negative cardio ROS Normal cardiovascular exam     Neuro/Psych   Anxiety        GI/Hepatic negative GI ROS,,,(+) Hepatitis -, CRemote hx polysubstance abuse   Endo/Other  negative endocrine ROS    Renal/GU negative Renal ROS     Musculoskeletal negative musculoskeletal ROS (+)    Abdominal   Peds  Hematology negative hematology ROS (+)   Anesthesia Other Findings Day of surgery medications reviewed with patient.  Reproductive/Obstetrics                              Anesthesia Physical Anesthesia Plan  ASA: 2  Anesthesia Plan: General   Post-op Pain Management: Tylenol  PO (pre-op)* and Dilaudid  IV   Induction: Intravenous  PONV Risk Score and Plan: 3 and Treatment may vary due to age or medical condition, Ondansetron , Dexamethasone, Midazolam  and Scopolamine  patch - Pre-op  Airway Management Planned: LMA  Additional Equipment: None  Intra-op Plan:   Post-operative Plan: Extubation in OR  Informed Consent: I have reviewed the patients History and Physical, chart, labs and discussed the procedure including the risks, benefits and alternatives for the proposed anesthesia with the patient or authorized representative who has indicated his/her understanding and acceptance.     Dental advisory given  Plan Discussed with: CRNA  Anesthesia Plan Comments:          Anesthesia Quick Evaluation

## 2024-07-25 NOTE — Anesthesia Procedure Notes (Signed)
 Anesthesia Regional Block: Adductor canal block   Pre-Anesthetic Checklist: , timeout performed,  Correct Patient, Correct Site, Correct Laterality,  Correct Procedure, Correct Position, site marked,  Risks and benefits discussed,  Pre-op evaluation,  At surgeon's request and post-op pain management  Laterality: Right  Prep: Maximum Sterile Barrier Precautions used, chloraprep       Needles:  Injection technique: Single-shot  Needle Type: Echogenic Stimulator Needle     Needle Length: 9cm  Needle Gauge: 22     Additional Needles:   Procedures:,,,, ultrasound used (permanent image in chart),,    Narrative:  Start time: 07/25/2024 7:09 AM End time: 07/25/2024 7:12 AM Injection made incrementally with aspirations every 5 mL.  Performed by: Personally  Anesthesiologist: Paul Lamarr BRAVO, MD  Additional Notes: Risks, benefits, and alternative discussed. Patient gave consent for procedure. Patient prepped and draped in sterile fashion. Sedation administered, patient remains easily responsive to voice. Relevant anatomy identified with ultrasound guidance. Local anesthetic given in 5cc increments with no signs or symptoms of intravascular injection. No pain or paraesthesias with injection. Patient monitored throughout procedure with signs of LAST or immediate complications. Tolerated well. Ultrasound image placed in chart.  LANEY Paul, MD

## 2024-07-25 NOTE — Anesthesia Postprocedure Evaluation (Signed)
 Anesthesia Post Note  Patient: Briana Zhang  Procedure(s) Performed: ARTHROSCOPY, KNEE, WITH MEDIAL MENISCECTOMY (Right: Knee) KNEE ARTHROSCOPY WITH ANTERIOR CRUCIATE LIGAMENT (ACL)  RECONSTRUCTION (Right: Knee)     Patient location during evaluation: PACU Anesthesia Type: General Level of consciousness: awake and alert Pain management: pain level controlled Vital Signs Assessment: post-procedure vital signs reviewed and stable Respiratory status: spontaneous breathing, nonlabored ventilation and respiratory function stable Cardiovascular status: blood pressure returned to baseline Postop Assessment: no apparent nausea or vomiting Anesthetic complications: no   No notable events documented.  Last Vitals:  Vitals:   07/25/24 0930 07/25/24 0945  BP: 110/79 96/73  Pulse: 95 86  Resp: 18 13  Temp:    SpO2: 99% 95%    Last Pain:  Vitals:   07/25/24 0937  TempSrc:   PainSc: 4         RLE Motor Response: Purposeful movement;Responds to commands (07/25/24 0945) RLE Sensation: Full sensation (07/25/24 0945)      Vertell Row

## 2024-07-25 NOTE — Brief Op Note (Signed)
 07/25/2024  8:42 AM  PATIENT:  Briana Zhang  43 y.o. female  PRE-OPERATIVE DIAGNOSIS:  Right knee medial meniscus tear Right anterior cruciate ligament tear  POST-OPERATIVE DIAGNOSIS:  Right knee medial bucket-handle meniscus tear Right anterior cruciate ligament tear  PROCEDURE:  Procedure(s) with comments: ARTHROSCOPY, KNEE, WITH MEDIAL MENISCECTOMY (Right) - KNEE ARTHROSCOPY WITH PARTIAL MEDIAL MENISCECTOMY AND DEBRIDEMENT KNEE ARTHROSCOPY WITH ANTERIOR CRUCIATE LIGAMENT (ACL)  RECONSTRUCTION (Right) - WITH ALLOGRAFT  SURGEON:  Surgeons and Role:    * Sharl Selinda Dover, MD - Primary  PHYSICIAN ASSISTANT: Dayle Moores, PA-C   ANESTHESIA:   regional and general  EBL:  10 mL   BLOOD ADMINISTERED:none  DRAINS: none   LOCAL MEDICATIONS USED:  NONE  SPECIMEN:  No Specimen  DISPOSITION OF SPECIMEN:  N/A  COUNTS:  YES  TOURNIQUET:  * Missing tourniquet times found for documented tourniquets in log: [id number] *  DICTATION: .Note written in EPIC  PLAN OF CARE: Discharge to home after PACU  PATIENT DISPOSITION:  PACU - hemodynamically stable.   Delay start of Pharmacological VTE agent (>24hrs) due to surgical blood loss or risk of bleeding: not applicable

## 2024-07-25 NOTE — Anesthesia Procedure Notes (Signed)
 Procedure Name: LMA Insertion Date/Time: 07/25/2024 7:44 AM  Performed by: Buster Catheryn SAUNDERS, CRNAPre-anesthesia Checklist: Patient identified, Emergency Drugs available, Suction available and Patient being monitored Patient Re-evaluated:Patient Re-evaluated prior to induction Oxygen Delivery Method: Circle system utilized Preoxygenation: Pre-oxygenation with 100% oxygen Induction Type: IV induction Ventilation: Mask ventilation without difficulty LMA: LMA inserted LMA Size: 4.0 Number of attempts: 1 Placement Confirmation: positive ETCO2 Tube secured with: Tape Dental Injury: Teeth and Oropharynx as per pre-operative assessment

## 2024-07-28 ENCOUNTER — Encounter (HOSPITAL_BASED_OUTPATIENT_CLINIC_OR_DEPARTMENT_OTHER): Payer: Self-pay | Admitting: Orthopedic Surgery

## 2024-07-29 ENCOUNTER — Encounter (HOSPITAL_BASED_OUTPATIENT_CLINIC_OR_DEPARTMENT_OTHER): Payer: Self-pay | Admitting: Orthopedic Surgery

## 2024-09-22 NOTE — Progress Notes (Unsigned)
 BH MD Outpatient Progress Note  09/23/2024 2:06 PM Briana Zhang  MRN:  [id number]  Assessment:  Briana Zhang presents for follow-up evaluation. Today, 09/23/24, patient reports continued stability of mood and denies signs/sx of depression or anxiety. She is recovering well from recent knee surgery and participating in physical therapy. Maintains abstinence from illicit substances. No changes to plan of care at this time.  Patient was made aware of this provider's departure from [facility] at the end of Nov 2025 and that she will be transitioned to alternative provider in the clinic after this time. All questions/concerns addressed.  RTC in 3 months by video with next provider.  Identifying Information: Briana Zhang is a 43 y.o. female with history of SIMD in remission, alcohol and polysubstance use in remission, and hepatitis C s/p treatment with undetectable RNA who is an established patient with [facility] participating in follow-up via video conferencing. Patient had previously been diagnosed with bipolar 1 disorder however on further exploration patient denied history of hypomania/mania outside of periods of active substance use/withdrawal. Given remission of symptoms upon cessation of substances and stability on SSRI, it is felt likely that previous symptoms attributed to bipolar are better explained by substance use.   Plan:  # Substance induced mood disorder now in remission  Anxiety Status of problem: stable Interventions: -- Continue Prozac  20 mg daily -- Continue Gabapentin  600 mg BID  -- Could consider gradual taper of this medication in the future however given tolerability and benefit, have maintained at current dosing  # Alcohol use disorder in sustained remission  Opioid use disorder in sustained remission  Cocaine  use disorder in sustained remission Status of problem: sustained remission Interventions: -- Continue to support patient in  maintaining sobriety -- Patient regularly attends NA meetings  Patient was given contact information for behavioral health clinic and was instructed to call 911 for emergencies.   Subjective:  Chief Complaint:  Chief Complaint  Patient presents with   Medication Management    Interval History:   Reniya reports she is doing well and denies any changes or big updates. Continues to take medications as prescribed; denies side effects - does not feel gabapentin  leads to any sedation. Describes mood is stable and denies signs/sx of persistently low, irritable mood or anxiety. Denies passive/active SI or HI. Sleeping well.   Had knee surgery in August and participating in PT.   No questions/concerns at this time and amenable to continuing as prescribed.   Visit Diagnosis:    ICD-10-CM   1. Substance induced mood disorder (HCC)  F19.94 FLUoxetine  (PROZAC ) 20 MG capsule    gabapentin  (NEURONTIN ) 600 MG tablet    2. Cocaine  use disorder, moderate, in sustained remission (HCC)  F14.21     3. Alcohol use disorder, moderate, in sustained remission (HCC)  F10.21     4. 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   [  ] Refer to NICU for discussion regarding neonatal withdrawal   Placental abruption in third trimester 06/14/2014   S/P C-section 06/18/2014   Substance abuse (HCC)    IVDU mulitple substances    Supervision of high risk pregnancy due to social problems in third trimester 05/27/2014            Clinic     [facility] - Hx of substance abuse - on methadone       Dating     LMP - Unsure of dates pending US       Genetic Screen    Too late      Anatomic US            GTT     Third trimester:       TDaP vaccine     05/27/14      Flu vaccine           GBS           Contraception     mirena      Baby Food     Considering Breast      Circumcision     If female as outpatient      Pediatrician             Past Surgical History:  Procedure Laterality Date   CESAREAN SECTION     CESAREAN SECTION N/A 06/18/2014   Procedure: CESAREAN SECTION;  Surgeon: Lynwood KANDICE Solomons, MD;  Location: [facility];  Service: Obstetrics;  Laterality: N/A;   KNEE ARTHROSCOPY WITH ANTERIOR CRUCIATE LIGAMENT (ACL) REPAIR Right 07/25/2024   Procedure: KNEE ARTHROSCOPY WITH ANTERIOR CRUCIATE LIGAMENT (ACL)  RECONSTRUCTION;  Surgeon: Sharl Selinda Dover, MD;  Location: [facility];  Service: Orthopedics;  Laterality: Right;  WITH ALLOGRAFT   KNEE ARTHROSCOPY WITH MEDIAL MENISECTOMY Right 07/25/2024   Procedure: ARTHROSCOPY, KNEE, WITH MEDIAL MENISCECTOMY;  Surgeon: Sharl Selinda Dover, MD;  Location: [facility];  Service: Orthopedics;  Laterality: Right;  KNEE ARTHROSCOPY WITH PARTIAL MEDIAL MENISCECTOMY AND DEBRIDEMENT   WISDOM TOOTH EXTRACTION      Family Psychiatric History:  Mother: depression  Family History:  Family History  Problem Relation Age of Onset   Cancer Mother    Depression Mother    Diabetes Father    Cancer Maternal Grandmother    Cancer Maternal Grandfather     Social History:  Employment: just started new full time job  Social History   Socioeconomic History   Marital status: Single    Spouse name: Not on file   Number of children: 3   Years of education: Not on file   Highest education level: GED or equivalent  Occupational History   Occupation: Financial planner   Tobacco Use    Smoking status: Former    Types: E-cigarettes, Cigarettes   Smokeless tobacco: Current  Vaping Use   Vaping status: Every Day  Substance and Sexual Activity   Alcohol use: No   Drug use: Not Currently    Comment: Last use of substances (heroin, cocaine , others) 08/24/2020   Sexual activity: Yes    Birth control/protection: I.U.D.  Other Topics Concern   Not on file  Social History Narrative   Not on file   Social Drivers of Health   Financial Resource Strain: Low Risk  (05/05/2024)   Received from [facility]   Overall Financial Resource Strain (CARDIA)    Difficulty of Paying Living Expenses: Not hard at all  Food Insecurity: No Food Insecurity (05/05/2024)   Received from [facility]   Hunger Vital Sign    Within the past 12 months, you worried that your food would run out before you got the money to buy more.: Never true    Within the past 12 months, the food you bought just didn't last and you didn't have money to get more.: Never true  Transportation Needs: No Transportation Needs (05/05/2024)   Received from [facility]   PRAPARE - Transportation    Lack of Transportation (Medical): No    Lack of Transportation (Non-Medical): No  Physical Activity: Insufficiently Active (05/05/2024)   Received from [facility]   Exercise Vital Sign    On average, how many days per week do you engage in moderate to strenuous exercise (like a brisk walk)?: 2 days    On average, how many minutes do you engage in exercise at this level?: 30 min  Stress: No Stress Concern Present (05/05/2024)   Received from [facility] - Occupational Stress Questionnaire    Feeling of Stress : Not at all  Social Connections: Socially Integrated (05/05/2024)   Received from [facility]   Social Network    How would you rate your social network (family, work, friends)?: Good participation with social networks    Allergies:  Allergies  Allergen  Reactions   Ciprofloxacin Swelling and Rash    Current Medications: Current Outpatient Medications  Medication Sig Dispense Refill   FLUoxetine  (PROZAC ) 20 MG capsule Take 1 capsule (20 mg total) by mouth daily. 30 capsule 3   gabapentin  (NEURONTIN ) 600 MG tablet Take 1 tablet (600 mg total) by mouth 2 (two) times daily. 60 tablet 3   ondansetron  (ZOFRAN -ODT) 4 MG disintegrating tablet Take 4 mg by mouth daily as needed for nausea. (Patient not taking: Reported on 09/23/2024)     valACYclovir  (VALTREX ) 1000 MG tablet Take 1,000 mg by mouth 2 (two) times daily.     No current facility-administered medications for this visit.    ROS: Denies any physical complaints  Objective:  Psychiatric Specialty Exam: There were no vitals taken for this visit.There is no height or weight on file to calculate BMI.  General Appearance: Casual and Well Groomed  Eye Contact:  Good  Speech:  Clear and Coherent and Normal Rate  Volume:  Normal  Mood:  good  Affect:  Congruent and Euthymic; constricted per baseline  Thought Content: Denies AVH; IOR   Suicidal Thoughts:  No  Homicidal Thoughts:  No  Thought Process:  Goal Directed and Linear  Orientation:  Full (Time, Place, and Person)    Memory:  Grossly intact  Judgment:  Good  Insight:  Good  Concentration:  Concentration: Good  Recall:  not formally assessed  Fund of Knowledge: Good  Language: Good  Psychomotor Activity:  Normal  Akathisia:  NA  AIMS (if indicated): not done  Assets:  Communication Skills Desire for Improvement Housing Leisure Time Physical Health Resilience Social Support Transportation Vocational/Educational  ADL's:  Intact  Cognition: WNL  Sleep:  Good   PE: General: sits comfortably in view of camera; no acute distress  Pulm: no increased work of breathing on room air  MSK: all extremity movements appear intact  Neuro: no focal neurological deficits observed  Gait & Station: unable to assess by video     Metabolic Disorder Labs: No results found for: HGBA1C, MPG No results found for: PROLACTIN No results found  for: CHOL, TRIG, HDL, CHOLHDL, VLDL, LDLCALC Lab Results  Component Value Date   TSH 0.739 05/07/2014    Therapeutic Level Labs: No results found for: LITHIUM No results found for: VALPROATE No results found for: CBMZ  Screenings:  GAD-7    Flowsheet Row Video Visit from 07/28/2022 in [facility] Video Visit from 01/20/2022 in [facility] Video Visit from 10/28/2021 in [facility] Video Visit from 07/29/2021 in [facility]  Total GAD-7 Score 0 0 4 0   PHQ2-9    Flowsheet Row Video Visit from 07/28/2022 in [facility] Video Visit from 01/20/2022 in [facility] Video Visit from 10/28/2021 in [facility] Video Visit from 07/29/2021 in [facility] Office Visit from 10/23/2018 in [facility] [place]  PHQ-2 Total Score 0 0 0 0 0  PHQ-9 Total Score 1 1 0 -- --   Flowsheet Row Admission (Discharged) from 07/25/2024 in [facility]  C-SSRS RISK CATEGORY No Risk    Collaboration of Care: Collaboration of Care: Medication Management AEB ongoing medication management and Psychiatrist AEB established with this provider  Patient/Guardian was advised Release of Information must be obtained prior to any record release in order to collaborate their care with an outside provider. Patient/Guardian was advised if they have not already done so to contact the registration department to sign all necessary forms in order for us  to release information regarding their care.   Consent: Patient/Guardian gives verbal consent for treatment and assignment of benefits for services provided during this visit.  Patient/Guardian expressed understanding and agreed to proceed.   Televisit via video: I connected wit patient on 09/23/24 at  2:00 PM EDT by a video enabled telemedicine application and verified that I am speaking with the correct person using two identifiers.  Location: Patient: private location in [place] Provider: clinic   I discussed the limitations of evaluation and management by telemedicine and the availability of in person appointments. The patient expressed understanding and agreed to proceed.  I discussed the assessment and treatment plan with the patient. The patient was provided an opportunity to ask questions and all were answered. The patient agreed with the plan and demonstrated an understanding of the instructions.   The patient was advised to call back or seek an in-person evaluation if the symptoms worsen or if the condition fails to improve as anticipated.  I provided 15 minutes dedicated to the care of this patient via video on the date of this encounter to include chart review, face-to-face time with the patient, medication management/counseling.  Loura Pitt A Yetta Marceaux 09/23/2024, 2:06 PM

## 2024-09-23 ENCOUNTER — Encounter (HOSPITAL_COMMUNITY): Payer: Self-pay | Admitting: Psychiatry

## 2024-09-23 ENCOUNTER — Telehealth (INDEPENDENT_AMBULATORY_CARE_PROVIDER_SITE_OTHER): Admitting: Psychiatry

## 2024-09-23 DIAGNOSIS — F1491 Cocaine use, unspecified, in remission: Secondary | ICD-10-CM

## 2024-09-23 DIAGNOSIS — F1994 Other psychoactive substance use, unspecified with psychoactive substance-induced mood disorder: Secondary | ICD-10-CM | POA: Diagnosis not present

## 2024-09-23 DIAGNOSIS — F1191 Opioid use, unspecified, in remission: Secondary | ICD-10-CM | POA: Diagnosis not present

## 2024-09-23 DIAGNOSIS — F1421 Cocaine dependence, in remission: Secondary | ICD-10-CM

## 2024-09-23 DIAGNOSIS — F1091 Alcohol use, unspecified, in remission: Secondary | ICD-10-CM

## 2024-09-23 DIAGNOSIS — F1021 Alcohol dependence, in remission: Secondary | ICD-10-CM

## 2024-09-23 DIAGNOSIS — F1121 Opioid dependence, in remission: Secondary | ICD-10-CM

## 2024-09-23 MED ORDER — GABAPENTIN 600 MG PO TABS: 600.0000 mg | ORAL_TABLET | Freq: Two times a day (BID) | ORAL | 3 refills | Status: DC

## 2024-09-23 MED ORDER — FLUOXETINE HCL 20 MG PO CAPS: 20.0000 mg | ORAL_CAPSULE | Freq: Every day | ORAL | 3 refills | Status: DC

## 2024-12-19 ENCOUNTER — Telehealth (HOSPITAL_COMMUNITY): Admitting: Psychiatry

## 2024-12-19 ENCOUNTER — Encounter (HOSPITAL_COMMUNITY): Payer: Self-pay | Admitting: Psychiatry

## 2024-12-19 DIAGNOSIS — F3178 Bipolar disorder, in full remission, most recent episode mixed: Secondary | ICD-10-CM

## 2024-12-19 DIAGNOSIS — F1994 Other psychoactive substance use, unspecified with psychoactive substance-induced mood disorder: Secondary | ICD-10-CM

## 2024-12-19 MED ORDER — GABAPENTIN 600 MG PO TABS: 600.0000 mg | ORAL_TABLET | Freq: Two times a day (BID) | ORAL | 3 refills | Status: AC

## 2024-12-19 MED ORDER — FLUOXETINE HCL 20 MG PO CAPS: 20.0000 mg | ORAL_CAPSULE | Freq: Every day | ORAL | 3 refills | Status: AC

## 2024-12-19 NOTE — Progress Notes (Signed)
 BH MD/PA/NP OP Progress Note  Virtual Visit via Video Note  I connected with Briana Zhang on 12/19/2024 at 11:30 AM EST by a video enabled telemedicine application and verified that I am speaking with the correct person using two identifiers.  Location: Patient: Home Provider: Clinic   I discussed the limitations of evaluation and management by telemedicine and the availability of in person appointments. The patient expressed understanding and agreed to proceed.  I provided 30 minutes of non-face-to-face time during this encounter.   12/19/2024 9:19 AM Briana Zhang  MRN:  [id number]  Chief Complaint: Im doing okay  HPI: 43 year old female seen today for follow up psychiatric evaluation.  She has a psychiatric history of bipolar disorder, anxiety, and poly substance use (alcohol, narcotics, methadone , and cocaine  in remission 4 years). She is currently managed on Gabapentin  600 mg twice daily and Prozac  20 mg daily. She notes her medications are effective in managing her psychiatric conditions.  Today she is well groomed, pleasant, cooperative, engaged in conversation, and maintained eye contact. She informed clinical research associate that she is doing okay. She informed clinical research associate that she enjoyed the holidays with her family. She notes that her mood is stable and reports that she has minimal anxiety and depression.  Provider conducted a GAD-7 and patient scored a 1.  Provider also conducted PHQ-9 and patient scored a 4.  She endorses adequate sleep and appetite.  Today she denies SI/HI/AVH, mania, paranoia.  Patient notes that she continue to maintain her sobriety. She has been sober for 4 years. She informed clinical research associate that she has been working as a radio broadcast assistant and plans to attend [employer] to get a degree in this.    No medication changes made today. Patient agreeable to continue medications as prescribed. Provider discussed reducing gabapentin  at patients next visit.   No other concerns noted  at this time.  Visit Diagnosis:    ICD-10-CM   1. 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   [  ] Refer to NICU for discussion regarding neonatal withdrawal   Placental abruption in third trimester 06/14/2014   S/P C-section 06/18/2014   Substance abuse (HCC)    IVDU mulitple substances   Supervision of high risk pregnancy due to social problems in third trimester 05/27/2014            Clinic     [facility] - Hx of substance abuse - on methadone       Dating     LMP - Unsure of dates pending US       Genetic Screen    Too late      Anatomic US            GTT     Third trimester:       TDaP vaccine     05/27/14      Flu vaccine           GBS           Contraception     mirena      [employer]  Considering Breast      Circumcision     If female as outpatient      Pediatrician             Past Surgical History:  Procedure Laterality Date   CESAREAN SECTION     CESAREAN SECTION N/A 06/18/2014   Procedure: CESAREAN SECTION;  Surgeon: Lynwood KANDICE Solomons, MD;  Location: [facility];  Service: Obstetrics;  Laterality: N/A;   KNEE ARTHROSCOPY WITH ANTERIOR CRUCIATE LIGAMENT (ACL) REPAIR Right 07/25/2024   Procedure: KNEE ARTHROSCOPY WITH ANTERIOR CRUCIATE LIGAMENT (ACL)  RECONSTRUCTION;  Surgeon: Sharl Selinda Dover, MD;  Location: [facility];  Service: Orthopedics;  Laterality: Right;  WITH ALLOGRAFT   KNEE ARTHROSCOPY WITH MEDIAL MENISECTOMY Right 07/25/2024   Procedure: ARTHROSCOPY, KNEE, WITH MEDIAL MENISCECTOMY;  Surgeon: Sharl Selinda Dover, MD;  Location: [facility];  Service:  Orthopedics;  Laterality: Right;  KNEE ARTHROSCOPY WITH PARTIAL MEDIAL MENISCECTOMY AND DEBRIDEMENT   WISDOM TOOTH EXTRACTION      Family Psychiatric History: Mom- depression  Family History:  Family History  Problem Relation Age of Onset   Cancer Mother    Depression Mother    Diabetes Father    Cancer Maternal Grandmother    Cancer Maternal Grandfather     Social History:  Social History   Socioeconomic History   Marital status: Single    Spouse name: Not on file   Number of children: 3   Years of education: Not on file   Highest education level: GED or equivalent  Occupational History   Occupation: Financial Planner   Tobacco Use   Smoking status: Former    Types: E-cigarettes, Cigarettes   Smokeless tobacco: Current  Vaping Use   Vaping status: Every Day  Substance and Sexual Activity   Alcohol use: No   Drug use: Not Currently    Comment: Last use of substances (heroin, cocaine , others) 08/24/2020   Sexual activity: Yes    Birth control/protection: I.U.D.  Other Topics Concern   Not on file  Social History Narrative   Not on file   Social Drivers of Health   Tobacco Use: High Risk (12/19/2024)   Patient History    Smoking Tobacco Use: Former    Smokeless Tobacco Use: Current    Passive Exposure: Not on file  Financial Resource Strain: Low Risk (05/05/2024)   Received from [facility]   Overall Financial Resource Strain (CARDIA)    Difficulty of Paying Living Expenses: Not hard at all  Food Insecurity: No Food Insecurity (05/05/2024)   Received from [facility]   Epic    Within the past 12 months, you worried that your food would run out before you got the money to buy more.: Never true    Within the past 12 months, the food you bought just didn't last and you didn't have money to get more.: Never true  Transportation Needs: No Transportation Needs (05/05/2024)   Received from [facility] - Transportation    Lack of Transportation  (Medical): No    Lack of Transportation (Non-Medical): No  Physical Activity: Insufficiently Active (05/05/2024)   Received from [facility]   Exercise Vital Sign    On average, how many days per week do you engage in moderate to strenuous exercise (like a brisk walk)?: 2 days    On average, how many minutes do you engage in exercise at this level?: 30 min  Stress: No Stress Concern Present (05/05/2024)  Received from [facility] - Occupational Stress Questionnaire    Feeling of Stress : Not at all  Social Connections: Socially Integrated (05/05/2024)   Received from [facility]   Social Network    How would you rate your social network (family, work, friends)?: Good participation with social networks  Depression (PHQ2-9): Low Risk (12/19/2024)   Depression (PHQ2-9)    PHQ-2 Score: 4  Alcohol Screen: Not on file  Housing: Low Risk (05/05/2024)   Received from [facility]    In the last 12 months, was there a time when you were not able to pay the mortgage or rent on time?: No    In the past 12 months, how many times have you moved where you were living?: 0    At any time in the past 12 months, were you homeless or living in a shelter (including now)?: No  Utilities: Not At Risk (05/05/2024)   Received from [facility] Utilities    Threatened with loss of utilities: No  Health Literacy: Not on file    Allergies:  Allergies  Allergen Reactions   Ciprofloxacin Swelling and Rash    Metabolic Disorder Labs: No results found for: HGBA1C, MPG No results found for: PROLACTIN No results found for: CHOL, TRIG, HDL, CHOLHDL, VLDL, LDLCALC Lab Results  Component Value Date   TSH 0.739 05/07/2014    Therapeutic Level Labs: No results found for: LITHIUM No results found for: VALPROATE No results found for: CBMZ  Current Medications: Current Outpatient Medications  Medication Sig Dispense  Refill   FLUoxetine  (PROZAC ) 20 MG capsule Take 1 capsule (20 mg total) by mouth daily. 30 capsule 3   gabapentin  (NEURONTIN ) 600 MG tablet Take 1 tablet (600 mg total) by mouth 2 (two) times daily. 60 tablet 3   ondansetron  (ZOFRAN -ODT) 4 MG disintegrating tablet Take 4 mg by mouth daily as needed for nausea. (Patient not taking: Reported on 09/23/2024)     valACYclovir (VALTREX) 1000 MG tablet Take 1,000 mg by mouth 2 (two) times daily.     No current facility-administered medications for this visit.     Musculoskeletal: Strength & Muscle Tone: within normal limits and  telehealth visit Gait & Station: normal, telehealth visit Patient leans: N/A  Psychiatric Specialty Exam: Review of Systems  There were no vitals taken for this visit.There is no height or weight on file to calculate BMI.  General Appearance: Well Groomed  Eye Contact:  Good  Speech:  Clear and Coherent and Normal Rate  Volume:  Normal  Mood:  Euthymic  Affect:  Appropriate and Congruent  Thought Process:  Coherent, Goal Directed, and Linear  Orientation:  Full (Time, Place, and Person)  Thought Content: WDL and Logical   Suicidal Thoughts:  No  Homicidal Thoughts:  No  Memory:  Immediate;   Good Recent;   Good Remote;   Good  Judgement:  Good  Insight:  Good  Psychomotor Activity:  Normal  Concentration:  Concentration: Good and Attention Span: Good  Recall:  Good  Fund of Knowledge: Good  Language: Good  Akathisia:  No  Handed:  Right  AIMS (if indicated): not done  Assets:  Communication Skills Desire for Improvement Financial Resources/Insurance Housing Physical Health Social Support  ADL's:  Intact  Cognition: WNL  Sleep:  Good   Screenings: GAD-7    Flowsheet Row Video Visit [facility] 12[facility]19/[facility]24 [facility] Video  Visit from 07/28/2022 in [facility] Video Visit from 01/20/2022 in [facility] Video Visit from  10/28/2021 in [facility] Video Visit from 07/29/2021 in [facility]  Total GAD-7 Score 1 0 0 4 0   PHQ2-9    Flowsheet Row Video Visit from 12/19/2024 in [facility] Video Visit from 07/28/2022 in [facility] Video Visit from 01/20/2022 in [facility] Video Visit from 10/28/2021 in [facility] Video Visit from 07/29/2021 in [facility]  PHQ-2 Total Score 0 0 0 0 0  PHQ-9 Total Score 4 1 1  0 --   Flowsheet Row Admission (Discharged) from 07/25/2024 in [facility]  C-SSRS RISK CATEGORY No Risk     Assessment and Plan: Patient notes that she is doing well on her current medication regimen.  No medication changes made today. Patient agreeable to continue medications as prescribed. Provider discussed reducing gabapentin  at patients next visit.   1. Bipolar 1 disorder, mixed, full remission (HCC)  Continue- FLUoxetine  (PROZAC ) 20 MG capsule; Take 1 capsule (20 mg total) by mouth daily.  Dispense: 30 capsule; Refill: 3 Continue- gabapentin  (NEURONTIN ) 600 MG tablet; Take 1 tablet (600 mg total) by mouth 2 (two) times daily.  Dispense: 60 tablet; Refill: 3  Follow up in 2 months  Zane FORBES Bach, NP 12/19/2024, 9:19 AM

## 2024-12-31 ENCOUNTER — Telehealth (HOSPITAL_COMMUNITY): Admitting: Psychiatry

## 2025-02-06 ENCOUNTER — Telehealth (HOSPITAL_COMMUNITY): Admitting: Psychiatry
